# Patient Record
Sex: Female | Born: 1955 | Race: White | Hispanic: No | Smoking: Never smoker
Health system: Southern US, Community
[De-identification: ages and names within clinical notes are randomized; demographics above are authoritative.]

## PROBLEM LIST (undated history)

## (undated) DIAGNOSIS — H269 Unspecified cataract: Secondary | ICD-10-CM

## (undated) DIAGNOSIS — M549 Dorsalgia, unspecified: Secondary | ICD-10-CM

## (undated) DIAGNOSIS — R109 Unspecified abdominal pain: Secondary | ICD-10-CM

## (undated) DIAGNOSIS — G4710 Hypersomnia, unspecified: Secondary | ICD-10-CM

## (undated) DIAGNOSIS — G4700 Insomnia, unspecified: Secondary | ICD-10-CM

## (undated) DIAGNOSIS — M8580 Other specified disorders of bone density and structure, unspecified site: Secondary | ICD-10-CM

## (undated) DIAGNOSIS — D509 Iron deficiency anemia, unspecified: Secondary | ICD-10-CM

## (undated) DIAGNOSIS — G43009 Migraine without aura, not intractable, without status migrainosus: Secondary | ICD-10-CM

## (undated) DIAGNOSIS — F329 Major depressive disorder, single episode, unspecified: Secondary | ICD-10-CM

## (undated) DIAGNOSIS — M1990 Unspecified osteoarthritis, unspecified site: Secondary | ICD-10-CM

## (undated) DIAGNOSIS — J019 Acute sinusitis, unspecified: Secondary | ICD-10-CM

## (undated) DIAGNOSIS — M810 Age-related osteoporosis without current pathological fracture: Secondary | ICD-10-CM

## (undated) DIAGNOSIS — M949 Disorder of cartilage, unspecified: Secondary | ICD-10-CM

## (undated) DIAGNOSIS — M899 Disorder of bone, unspecified: Secondary | ICD-10-CM

## (undated) DIAGNOSIS — K219 Gastro-esophageal reflux disease without esophagitis: Secondary | ICD-10-CM

## (undated) DIAGNOSIS — J309 Allergic rhinitis, unspecified: Secondary | ICD-10-CM

## (undated) DIAGNOSIS — E785 Hyperlipidemia, unspecified: Secondary | ICD-10-CM

## (undated) DIAGNOSIS — F411 Generalized anxiety disorder: Secondary | ICD-10-CM

## (undated) DIAGNOSIS — Z8719 Personal history of other diseases of the digestive system: Secondary | ICD-10-CM

## (undated) HISTORY — DX: Allergic rhinitis, unspecified: J30.9

## (undated) HISTORY — DX: Acute sinusitis, unspecified: J01.90

## (undated) HISTORY — DX: Personal history of other diseases of the digestive system: Z87.19

## (undated) HISTORY — DX: Unspecified cataract: H26.9

## (undated) HISTORY — DX: Disorder of cartilage, unspecified: M94.9

## (undated) HISTORY — DX: Hyperlipidemia, unspecified: E78.5

## (undated) HISTORY — DX: Insomnia, unspecified: G47.00

## (undated) HISTORY — DX: Iron deficiency anemia, unspecified: D50.9

## (undated) HISTORY — DX: Gastro-esophageal reflux disease without esophagitis: K21.9

## (undated) HISTORY — PX: COLONOSCOPY: SHX5424

## (undated) HISTORY — PX: APPENDECTOMY: SHX54

## (undated) HISTORY — PX: ESOPHAGOGASTRODUODENOSCOPY: SHX1529

## (undated) HISTORY — PX: TUBAL LIGATION: SHX77

## (undated) HISTORY — DX: Hypersomnia, unspecified: G47.10

## (undated) HISTORY — DX: Other specified disorders of bone density and structure, unspecified site: M85.80

## (undated) HISTORY — DX: Generalized anxiety disorder: F41.1

## (undated) HISTORY — DX: Disorder of bone, unspecified: M89.9

## (undated) HISTORY — DX: Age-related osteoporosis without current pathological fracture: M81.0

## (undated) HISTORY — DX: Dorsalgia, unspecified: M54.9

## (undated) HISTORY — DX: Unspecified osteoarthritis, unspecified site: M19.90

## (undated) HISTORY — DX: Unspecified abdominal pain: R10.9

## (undated) HISTORY — DX: Major depressive disorder, single episode, unspecified: F32.9

## (undated) HISTORY — DX: Migraine without aura, not intractable, without status migrainosus: G43.009

---

## 1998-07-23 ENCOUNTER — Ambulatory Visit (HOSPITAL_COMMUNITY): Admission: RE | Admit: 1998-07-23 | Discharge: 1998-07-23 | Payer: Self-pay | Admitting: Obstetrics and Gynecology

## 1998-09-17 ENCOUNTER — Encounter: Payer: Self-pay | Admitting: Obstetrics and Gynecology

## 1998-09-17 ENCOUNTER — Ambulatory Visit (HOSPITAL_COMMUNITY): Admission: RE | Admit: 1998-09-17 | Discharge: 1998-09-17 | Payer: Self-pay | Admitting: Obstetrics and Gynecology

## 1998-09-23 ENCOUNTER — Encounter (HOSPITAL_COMMUNITY): Admission: RE | Admit: 1998-09-23 | Discharge: 1998-10-26 | Payer: Self-pay | Admitting: Obstetrics and Gynecology

## 1998-10-09 ENCOUNTER — Inpatient Hospital Stay (HOSPITAL_COMMUNITY): Admission: AD | Admit: 1998-10-09 | Discharge: 1998-10-09 | Payer: Self-pay | Admitting: Obstetrics and Gynecology

## 1998-10-23 ENCOUNTER — Inpatient Hospital Stay (HOSPITAL_COMMUNITY): Admission: AD | Admit: 1998-10-23 | Discharge: 1998-10-28 | Payer: Self-pay | Admitting: Obstetrics and Gynecology

## 1998-10-30 ENCOUNTER — Encounter (HOSPITAL_COMMUNITY): Admission: RE | Admit: 1998-10-30 | Discharge: 1999-01-28 | Payer: Self-pay | Admitting: *Deleted

## 1998-12-24 ENCOUNTER — Other Ambulatory Visit: Admission: RE | Admit: 1998-12-24 | Discharge: 1998-12-24 | Payer: Self-pay | Admitting: Obstetrics and Gynecology

## 2000-05-31 ENCOUNTER — Encounter: Payer: Self-pay | Admitting: Obstetrics and Gynecology

## 2000-05-31 ENCOUNTER — Encounter: Admission: RE | Admit: 2000-05-31 | Discharge: 2000-05-31 | Payer: Self-pay | Admitting: Obstetrics and Gynecology

## 2001-06-28 ENCOUNTER — Encounter: Payer: Self-pay | Admitting: Obstetrics and Gynecology

## 2001-06-28 ENCOUNTER — Encounter: Admission: RE | Admit: 2001-06-28 | Discharge: 2001-06-28 | Payer: Self-pay | Admitting: Obstetrics and Gynecology

## 2001-08-01 ENCOUNTER — Other Ambulatory Visit: Admission: RE | Admit: 2001-08-01 | Discharge: 2001-08-01 | Payer: Self-pay | Admitting: Obstetrics and Gynecology

## 2003-02-08 ENCOUNTER — Other Ambulatory Visit: Admission: RE | Admit: 2003-02-08 | Discharge: 2003-02-08 | Payer: Self-pay | Admitting: Obstetrics and Gynecology

## 2003-03-12 ENCOUNTER — Encounter: Payer: Self-pay | Admitting: Obstetrics and Gynecology

## 2003-03-12 ENCOUNTER — Ambulatory Visit (HOSPITAL_COMMUNITY): Admission: RE | Admit: 2003-03-12 | Discharge: 2003-03-12 | Payer: Self-pay | Admitting: Obstetrics and Gynecology

## 2003-07-09 ENCOUNTER — Encounter: Payer: Self-pay | Admitting: Obstetrics and Gynecology

## 2003-07-09 ENCOUNTER — Ambulatory Visit (HOSPITAL_COMMUNITY): Admission: AD | Admit: 2003-07-09 | Discharge: 2003-07-09 | Payer: Self-pay | Admitting: Obstetrics and Gynecology

## 2003-08-06 ENCOUNTER — Inpatient Hospital Stay (HOSPITAL_COMMUNITY): Admission: AD | Admit: 2003-08-06 | Discharge: 2003-08-08 | Payer: Self-pay | Admitting: Obstetrics and Gynecology

## 2003-08-06 ENCOUNTER — Encounter (INDEPENDENT_AMBULATORY_CARE_PROVIDER_SITE_OTHER): Payer: Self-pay | Admitting: Specialist

## 2004-02-11 ENCOUNTER — Other Ambulatory Visit: Admission: RE | Admit: 2004-02-11 | Discharge: 2004-02-11 | Payer: Self-pay | Admitting: Obstetrics and Gynecology

## 2004-02-15 ENCOUNTER — Ambulatory Visit (HOSPITAL_COMMUNITY): Admission: RE | Admit: 2004-02-15 | Discharge: 2004-02-15 | Payer: Self-pay | Admitting: Obstetrics and Gynecology

## 2005-03-10 ENCOUNTER — Other Ambulatory Visit: Admission: RE | Admit: 2005-03-10 | Discharge: 2005-03-10 | Payer: Self-pay | Admitting: Obstetrics and Gynecology

## 2005-09-30 ENCOUNTER — Ambulatory Visit: Payer: Self-pay | Admitting: Internal Medicine

## 2005-10-05 ENCOUNTER — Ambulatory Visit: Payer: Self-pay | Admitting: Internal Medicine

## 2005-10-20 ENCOUNTER — Ambulatory Visit: Payer: Self-pay

## 2007-01-27 ENCOUNTER — Ambulatory Visit: Payer: Self-pay | Admitting: Internal Medicine

## 2007-01-27 LAB — CONVERTED CEMR LAB
Basophils Absolute: 0.1 10*3/uL (ref 0.0–0.1)
Basophils Relative: 0.7 % (ref 0.0–1.0)
Bilirubin Urine: NEGATIVE
CO2: 28 meq/L (ref 19–32)
Creatinine, Ser: 0.8 mg/dL (ref 0.4–1.2)
FSH: 4 milliintl units/mL
GFR calc Af Amer: 98 mL/min
HCT: 38.7 % (ref 36.0–46.0)
Hemoglobin, Urine: NEGATIVE
Hemoglobin: 13.6 g/dL (ref 12.0–15.0)
LDL Cholesterol: 88 mg/dL (ref 0–99)
Leukocytes, UA: NEGATIVE
Lymphocytes Relative: 29 % (ref 12.0–46.0)
MCHC: 35.1 g/dL (ref 30.0–36.0)
Monocytes Absolute: 0.7 10*3/uL (ref 0.2–0.7)
Monocytes Relative: 9.5 % (ref 3.0–11.0)
Neutro Abs: 4 10*3/uL (ref 1.4–7.7)
Neutrophils Relative %: 55.5 % (ref 43.0–77.0)
Potassium: 4.2 meq/L (ref 3.5–5.1)
RDW: 12 % (ref 11.5–14.6)
Sodium: 139 meq/L (ref 135–145)
TSH: 3.17 microintl units/mL (ref 0.35–5.50)
Total Bilirubin: 0.4 mg/dL (ref 0.3–1.2)
Total CHOL/HDL Ratio: 3.5
Total Protein: 6.9 g/dL (ref 6.0–8.3)
Urobilinogen, UA: 0.2 (ref 0.0–1.0)
VLDL: 30 mg/dL (ref 0–40)
pH: 6.5 (ref 5.0–8.0)

## 2007-01-31 ENCOUNTER — Ambulatory Visit: Payer: Self-pay | Admitting: Internal Medicine

## 2007-02-21 ENCOUNTER — Ambulatory Visit: Payer: Self-pay | Admitting: Internal Medicine

## 2007-02-21 LAB — CONVERTED CEMR LAB: Phosphorus: 3.4 mg/dL (ref 2.3–4.6)

## 2007-10-30 DIAGNOSIS — K219 Gastro-esophageal reflux disease without esophagitis: Secondary | ICD-10-CM | POA: Insufficient documentation

## 2007-10-30 DIAGNOSIS — Z8719 Personal history of other diseases of the digestive system: Secondary | ICD-10-CM

## 2007-10-30 DIAGNOSIS — F329 Major depressive disorder, single episode, unspecified: Secondary | ICD-10-CM

## 2007-10-30 DIAGNOSIS — G43009 Migraine without aura, not intractable, without status migrainosus: Secondary | ICD-10-CM | POA: Insufficient documentation

## 2007-10-30 DIAGNOSIS — D509 Iron deficiency anemia, unspecified: Secondary | ICD-10-CM

## 2007-10-30 DIAGNOSIS — J309 Allergic rhinitis, unspecified: Secondary | ICD-10-CM

## 2007-10-30 DIAGNOSIS — F3289 Other specified depressive episodes: Secondary | ICD-10-CM

## 2007-10-30 DIAGNOSIS — F411 Generalized anxiety disorder: Secondary | ICD-10-CM

## 2007-10-30 HISTORY — DX: Major depressive disorder, single episode, unspecified: F32.9

## 2007-10-30 HISTORY — DX: Personal history of other diseases of the digestive system: Z87.19

## 2007-10-30 HISTORY — DX: Generalized anxiety disorder: F41.1

## 2007-10-30 HISTORY — DX: Iron deficiency anemia, unspecified: D50.9

## 2007-10-30 HISTORY — DX: Other specified depressive episodes: F32.89

## 2007-10-30 HISTORY — DX: Migraine without aura, not intractable, without status migrainosus: G43.009

## 2007-10-30 HISTORY — DX: Allergic rhinitis, unspecified: J30.9

## 2007-10-30 HISTORY — DX: Gastro-esophageal reflux disease without esophagitis: K21.9

## 2008-02-07 ENCOUNTER — Ambulatory Visit: Payer: Self-pay | Admitting: Internal Medicine

## 2008-02-07 LAB — CONVERTED CEMR LAB
ALT: 20 units/L (ref 0–35)
Alkaline Phosphatase: 62 units/L (ref 39–117)
BUN: 9 mg/dL (ref 6–23)
Basophils Relative: 0.9 % (ref 0.0–1.0)
Bilirubin Urine: NEGATIVE
CO2: 30 meq/L (ref 19–32)
Calcium: 9.5 mg/dL (ref 8.4–10.5)
Chloride: 105 meq/L (ref 96–112)
GFR calc Af Amer: 98 mL/min
GFR calc non Af Amer: 81 mL/min
Ketones, ur: NEGATIVE mg/dL
LDL Cholesterol: 110 mg/dL — ABNORMAL HIGH (ref 0–99)
Lymphocytes Relative: 20 % (ref 12.0–46.0)
Monocytes Relative: 5.7 % (ref 3.0–11.0)
Neutro Abs: 6.4 10*3/uL (ref 1.4–7.7)
Platelets: 243 10*3/uL (ref 150–400)
RBC: 4.99 M/uL (ref 3.87–5.11)
Specific Gravity, Urine: 1.015 (ref 1.000–1.03)
Total CHOL/HDL Ratio: 4.8
Total Protein, Urine: NEGATIVE mg/dL
Total Protein: 7.4 g/dL (ref 6.0–8.3)
Triglycerides: 151 mg/dL — ABNORMAL HIGH (ref 0–149)
Urine Glucose: NEGATIVE mg/dL
VLDL: 30 mg/dL (ref 0–40)
pH: 7 (ref 5.0–8.0)

## 2008-02-13 ENCOUNTER — Ambulatory Visit: Payer: Self-pay | Admitting: Internal Medicine

## 2008-02-13 DIAGNOSIS — M8580 Other specified disorders of bone density and structure, unspecified site: Secondary | ICD-10-CM | POA: Insufficient documentation

## 2008-02-13 DIAGNOSIS — J019 Acute sinusitis, unspecified: Secondary | ICD-10-CM

## 2008-02-13 DIAGNOSIS — M549 Dorsalgia, unspecified: Secondary | ICD-10-CM | POA: Insufficient documentation

## 2008-02-13 DIAGNOSIS — M899 Disorder of bone, unspecified: Secondary | ICD-10-CM

## 2008-02-13 HISTORY — DX: Acute sinusitis, unspecified: J01.90

## 2008-02-13 HISTORY — DX: Disorder of bone, unspecified: M89.9

## 2008-02-13 HISTORY — DX: Dorsalgia, unspecified: M54.9

## 2008-09-05 ENCOUNTER — Telehealth (INDEPENDENT_AMBULATORY_CARE_PROVIDER_SITE_OTHER): Payer: Self-pay | Admitting: *Deleted

## 2008-09-11 ENCOUNTER — Ambulatory Visit: Payer: Self-pay | Admitting: Internal Medicine

## 2008-09-11 DIAGNOSIS — G4700 Insomnia, unspecified: Secondary | ICD-10-CM

## 2008-09-11 DIAGNOSIS — G4710 Hypersomnia, unspecified: Secondary | ICD-10-CM

## 2008-09-11 HISTORY — DX: Hypersomnia, unspecified: G47.10

## 2008-09-11 HISTORY — DX: Insomnia, unspecified: G47.00

## 2009-03-21 ENCOUNTER — Encounter: Payer: Self-pay | Admitting: Internal Medicine

## 2009-03-21 ENCOUNTER — Ambulatory Visit: Payer: Self-pay | Admitting: Internal Medicine

## 2009-05-09 ENCOUNTER — Ambulatory Visit: Payer: Self-pay | Admitting: Internal Medicine

## 2009-05-09 LAB — CONVERTED CEMR LAB
ALT: 16 units/L (ref 0–35)
Alkaline Phosphatase: 54 units/L (ref 39–117)
Basophils Relative: 0.6 % (ref 0.0–3.0)
Bilirubin, Direct: 0.1 mg/dL (ref 0.0–0.3)
Calcium: 8.9 mg/dL (ref 8.4–10.5)
Creatinine, Ser: 0.9 mg/dL (ref 0.4–1.2)
Eosinophils Relative: 6.3 % — ABNORMAL HIGH (ref 0.0–5.0)
HDL: 50.8 mg/dL (ref >39.00)
Hemoglobin, Urine: NEGATIVE
LDL Cholesterol: 104 mg/dL — ABNORMAL HIGH (ref 0–99)
Lymphocytes Relative: 28.5 % (ref 12.0–46.0)
Neutrophils Relative %: 54.3 % (ref 43.0–77.0)
RBC: 4.72 M/uL (ref 3.87–5.11)
Total CHOL/HDL Ratio: 3
Total Protein, Urine: NEGATIVE mg/dL
Total Protein: 7.1 g/dL (ref 6.0–8.3)
Triglycerides: 92 mg/dL (ref 0.0–149.0)
Urine Glucose: NEGATIVE mg/dL
WBC: 4.7 10*3/uL (ref 4.5–10.5)

## 2009-05-14 ENCOUNTER — Ambulatory Visit: Payer: Self-pay | Admitting: Internal Medicine

## 2009-05-14 DIAGNOSIS — R109 Unspecified abdominal pain: Secondary | ICD-10-CM

## 2009-05-14 HISTORY — DX: Unspecified abdominal pain: R10.9

## 2009-05-15 ENCOUNTER — Telehealth (INDEPENDENT_AMBULATORY_CARE_PROVIDER_SITE_OTHER): Payer: Self-pay | Admitting: *Deleted

## 2009-06-06 ENCOUNTER — Telehealth (INDEPENDENT_AMBULATORY_CARE_PROVIDER_SITE_OTHER): Payer: Self-pay | Admitting: *Deleted

## 2009-07-31 ENCOUNTER — Telehealth: Payer: Self-pay | Admitting: Internal Medicine

## 2010-03-01 IMAGING — CR DG PELVIS 1-2V
1 series · 1 of 1 positions shown · non-contrast
Comparison: None

CLINICAL DATA: Left-sided coccygeal pain

PELVIS - 1-2 VIEW

[view not recorded]
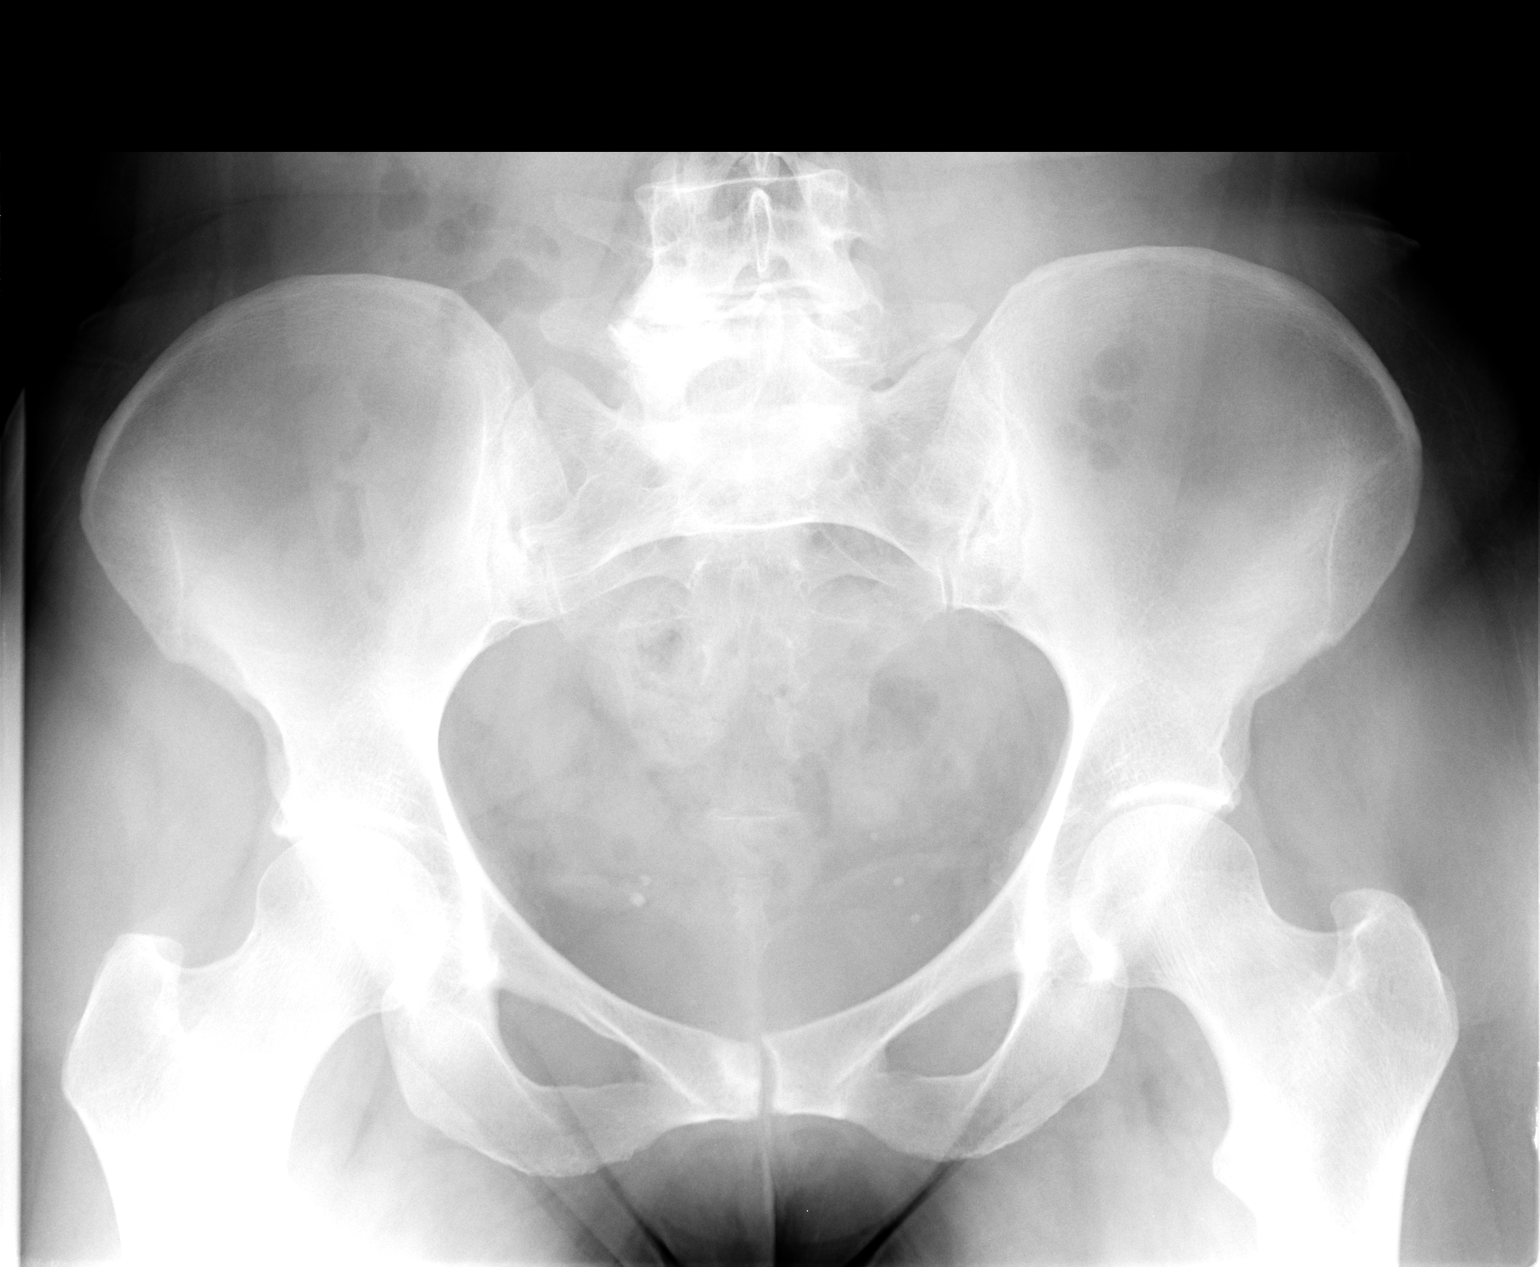

[1 of 1 positions shown; findings below may reference images not displayed]

FINDINGS: Bony pelvis intact.  Hip is in normal position.  Mild
degenerative changes lower lumbar spine and at the pubic symphysis.
The SI joints are symmetric and normal.
IMPRESSION: Mild degenerative changes lower lumbar spine and at the pubic
symphysis.

## 2010-04-07 ENCOUNTER — Telehealth: Payer: Self-pay | Admitting: Internal Medicine

## 2010-05-12 ENCOUNTER — Telehealth: Payer: Self-pay | Admitting: Internal Medicine

## 2010-05-12 ENCOUNTER — Ambulatory Visit: Payer: Self-pay | Admitting: Internal Medicine

## 2010-05-12 LAB — CONVERTED CEMR LAB
ALT: 13 units/L (ref 0–35)
AST: 19 units/L (ref 0–37)
Alkaline Phosphatase: 56 units/L (ref 39–117)
BUN: 11 mg/dL (ref 6–23)
Basophils Absolute: 0 10*3/uL (ref 0.0–0.1)
Bilirubin Urine: NEGATIVE
Bilirubin, Direct: 0 mg/dL (ref 0.0–0.3)
Calcium: 8.5 mg/dL (ref 8.4–10.5)
Cholesterol: 162 mg/dL (ref 0–200)
Creatinine, Ser: 0.7 mg/dL (ref 0.4–1.2)
Eosinophils Relative: 3.9 % (ref 0.0–5.0)
GFR calc non Af Amer: 90.48 mL/min (ref >60)
HCT: 40.5 % (ref 36.0–46.0)
HDL: 59.5 mg/dL (ref >39.00)
Hemoglobin, Urine: NEGATIVE
LDL Cholesterol: 89 mg/dL (ref 0–99)
Lymphocytes Relative: 25.6 % (ref 12.0–46.0)
Monocytes Relative: 9 % (ref 3.0–12.0)
Neutrophils Relative %: 60.8 % (ref 43.0–77.0)
Platelets: 246 10*3/uL (ref 150.0–400.0)
Potassium: 4.7 meq/L (ref 3.5–5.1)
TSH: 1.8 microintl units/mL (ref 0.35–5.50)
Total Bilirubin: 0.6 mg/dL (ref 0.3–1.2)
Total Protein, Urine: NEGATIVE mg/dL
Urine Glucose: NEGATIVE mg/dL
VLDL: 13.4 mg/dL (ref 0.0–40.0)
WBC: 6.4 10*3/uL (ref 4.5–10.5)
pH: 8 (ref 5.0–8.0)

## 2010-05-15 ENCOUNTER — Telehealth: Payer: Self-pay | Admitting: Internal Medicine

## 2010-05-15 ENCOUNTER — Ambulatory Visit: Payer: Self-pay | Admitting: Internal Medicine

## 2010-05-15 DIAGNOSIS — R5383 Other fatigue: Secondary | ICD-10-CM

## 2010-05-15 DIAGNOSIS — R5381 Other malaise: Secondary | ICD-10-CM

## 2010-05-21 ENCOUNTER — Encounter (INDEPENDENT_AMBULATORY_CARE_PROVIDER_SITE_OTHER): Payer: Self-pay | Admitting: *Deleted

## 2010-07-25 ENCOUNTER — Telehealth: Payer: Self-pay | Admitting: Internal Medicine

## 2010-12-02 ENCOUNTER — Telehealth: Payer: Self-pay | Admitting: Internal Medicine

## 2011-01-18 LAB — CONVERTED CEMR LAB
Calcium, Total (PTH): 9.6 mg/dL (ref 8.4–10.5)
PTH: 18.7 pg/mL (ref 14.0–72.0)

## 2011-01-22 NOTE — Progress Notes (Signed)
Summary: medication  refill  Phone Note Refill Request Message from:  Fax from Pharmacy on December 02, 2010 10:20 AM  Refills Requested: Medication #1:  ZOLPIDEM TARTRATE 10 MG TABS Take 1 tablet by mouth at bedtime as needed -   Dosage confirmed as above?Dosage Confirmed   Last Refilled: 05/15/2010   Notes: [facility], fax#[phone] Initial call taken by: Zella Ball Ewing CMA Duncan Dull),  December 02, 2010 10:22 AM  Follow-up for Phone Call        faxed hardcopy to pharmacy Follow-up by: Robin Ewing CMA Duncan Dull),  December 02, 2010 1:19 PM    Prescriptions: ZOLPIDEM TARTRATE 10 MG TABS (ZOLPIDEM TARTRATE) Take 1 tablet by mouth at bedtime as needed -  #30 x 5   Entered and Authorized by:   Corwin Levins MD   Signed by:   Corwin Levins MD on 12/02/2010   Method used:   Print then Give to Patient   RxID:   [id number]  done hardcopy to LIM side B - dahlia Corwin Levins MD  December 02, 2010 1:08 PM

## 2011-01-22 NOTE — Assessment & Plan Note (Signed)
Summary: PHYSICAL--STC   Vital Signs:  Patient profile:   55 year old female Height:      66.5 inches Weight:      158.38 pounds BMI:     25.27 O2 Sat:      98 % on Room air Temp:     98.4 degrees F oral BP sitting:   102 / 64  (left arm) Cuff size:   regular  Vitals Entered ByZella Ball Ewing (May 15, 2010 11:28 AM)  O2 Flow:  Room air  CC: Adult physical/RE   CC:  Adult physical/RE.  History of Present Illness: overall doing well, but with signficant worsening anxiety despite currnet meds without panic, trigger or specific situation it seems, and wihtout suicidal ideation; as well as ongoing fatigue despite no other depressive symtpoms;  Pt denies CP, sob, doe, wheezing, orthopnea, pnd, worsening LE edema, palps, dizziness or syncope  Pt denies new neuro symptoms such as headache, facial or extremity weakness   Still with significnat daytime somnolence, now willing for sleep eval., but also with less than ideal sleep hygeine with much dificulty getting to sleep as well.  Also with recent soreness to the legs recently after increased exertions, now resolved after present for 2 to 3 days.  Has some difficulty with occasinally finding certain words but remembers tham later, adn no othe TIA or stroke like symptoms such as headache, or weakness.  No recent fever, trauma but has ongoing stress as well.    Preventive Screening-Counseling & Management      Drug Use:  no.    Problems Prior to Update: 1)  Back Pain  (ICD-724.5) 2)  Pelvic Pain, Chronic  (ICD-789.09) 3)  Preventive Health Care  (ICD-V70.0) 4)  Insomnia-sleep Disorder-unspec  (ICD-780.52) 5)  Screening For Alcoholism  (ICD-V79.1) 6)  Hypersomnia  (ICD-780.54) 7)  Sinusitis- Acute-nos  (ICD-461.9) 8)  Back Pain  (ICD-724.5) 9)  Preventive Health Care  (ICD-V70.0) 10)  Osteopenia  (ICD-733.90) 11)  Routine General Medical Exam@health  Care Facl  (ICD-V70.0) 12)  Irritable Bowel Syndrome, Hx of  (ICD-V12.79) 13)  Family  History Depression  (ICD-V17.0) 14)  Family History of Cad Female 1st Degree Relative <50  (ICD-V17.3) 15)  Family History of Cad Female 1st Degree Relative <60  (ICD-V16.49) 16)  Anxiety  (ICD-300.00) 17)  Common Migraine  (ICD-346.10) 18)  Allergic Rhinitis  (ICD-477.9) 19)  Depression  (ICD-311) 20)  Gerd  (ICD-530.81) 21)  Anemia-iron Deficiency  (ICD-280.9)  Medications Prior to Update: 1)  Alprazolam 0.5 Mg Tabs (Alprazolam) .... Take 1 Tablet By Mouth Twice A Day As Needed 2)  Zolpidem Tartrate 10 Mg Tabs (Zolpidem Tartrate) .... Take 1 Tablet By Mouth At Bedtime As Needed - Please Make Return Office Visit For Further Refills 3)  Calcium W/ Vit D .... 1 By Mouth Qd 4)  Celexa 20 Mg Tabs (Citalopram Hydrobromide) .Marland Kitchen.. 1 By Mouth Once Daily 5)  Balziva 0.4-35 Mg-Mcg Tabs (Norethindrone-Eth Estradiol) .Marland Kitchen.. 1 By Mouth Daily 6)  Wellbutrin Sr 150 Mg Xr12h-Tab (Bupropion Hcl) .Marland Kitchen.. 1 By Mouth Once Daily 7)  Meloxicam 15 Mg Tabs (Meloxicam) .Marland Kitchen.. 1 By Mouth Once Daily 8)  Cetirizine Hcl 10 Mg Tabs (Cetirizine Hcl) .Marland Kitchen.. 1 By Mouth Once Daily As Needed  Current Medications (verified): 1)  Alprazolam 0.5 Mg Tabs (Alprazolam) .... Take 1 Tablet By Mouth Twice A Day As Needed 2)  Zolpidem Tartrate 10 Mg Tabs (Zolpidem Tartrate) .... Take 1 Tablet By Mouth At Bedtime As Needed -  3)  Calcium W/ Vit D .... 1 By Mouth Qd 4)  Fluoxetine Hcl 20 Mg Caps (Fluoxetine Hcl) .Marland Kitchen.. 1 By Mouth Once Daily 5)  Balziva 0.4-35 Mg-Mcg Tabs (Norethindrone-Eth Estradiol) .Marland Kitchen.. 1 By Mouth Daily 6)  Wellbutrin Sr 150 Mg Xr12h-Tab (Bupropion Hcl) .Marland Kitchen.. 1 By Mouth Once Daily 7)  Meloxicam 15 Mg Tabs (Meloxicam) .Marland Kitchen.. 1 By Mouth Once Daily 8)  Cetirizine Hcl 10 Mg Tabs (Cetirizine Hcl) .Marland Kitchen.. 1 By Mouth Once Daily As Needed 9)  Vitamin C 1000 Mg Tabs (Ascorbic Acid) .Marland Kitchen.. 1 By Mouth Once Daily  Allergies (verified): No Known Drug Allergies  Past History:  Past Medical History: Last updated: 02/13/2008 Anemia-iron  deficiency hx of menorrhatia GERD Depression Allergic rhinitis migraine Anxiety IBS endometriosis Osteopenia  Past Surgical History: Last updated: 10/30/2007 Tubal ligation c-section  Family History: Last updated: 10/30/2007 Family History of CAD Female 1st degree relative Family History of CAD Female 1st degree relative <60 - sister Family History Depression  Social History: Last updated: 05/15/2010 Never Smoked Alcohol use-yes 4 children second marriage Drug use-no  Risk Factors: Smoking Status: never (10/30/2007)  Family History: Reviewed history from 10/30/2007 and no changes required. Family History of CAD Female 1st degree relative Family History of CAD Female 1st degree relative <60 - sister Family History Depression  Social History: Reviewed history from 05/14/2009 and no changes required. Never Smoked Alcohol use-yes 4 children second marriage Drug use-no Drug Use:  no  Review of Systems  The patient denies anorexia, fever, weight loss, vision loss, decreased hearing, hoarseness, chest pain, syncope, dyspnea on exertion, peripheral edema, prolonged cough, headaches, hemoptysis, abdominal pain, melena, hematochezia, severe indigestion/heartburn, hematuria, muscle weakness, suspicious skin lesions, transient blindness, difficulty walking, depression, unusual weight change, abnormal bleeding, enlarged lymph nodes, and angioedema.         all otherwise negative per pt -    Physical Exam  General:  alert and well-developed.   Head:  normocephalic and atraumatic.   Eyes:  vision grossly intact, pupils equal, and pupils round.   Ears:  R ear normal and L ear normal.   Nose:  no external deformity and nose piercing noted.   Mouth:  no gingival abnormalities and pharynx pink and moist.   Neck:  supple and no masses.   Lungs:  normal respiratory effort and normal breath sounds.   Heart:  normal rate and regular rhythm.   Abdomen:  soft, non-tender, and  normal bowel sounds.   Msk:  no joint tenderness and no joint swelling.   Extremities:  no edema, no erythema  Neurologic:  cranial nerves II-XII intact, strength normal in all extremities, sensation intact to light touch, and gait normal.   Skin:  color normal and no rashes.   Psych:  not depressed appearing and moderately anxious.     Impression & Recommendations:  Problem # 1:  Preventive Health Care (ICD-V70.0)  Overall doing well, age appropriate education and counseling updated and referral for appropriate preventive services done unless declined, immunizations up to date or declined, diet counseling done if overweight, urged to quit smoking if smokes , most recent labs reviewed and current ordered if appropriate, ecg reviewed or declined (interpretation per ECG scanned in the EMR if done); information regarding Medicare Prevention requirements given if appropriate; speciality referrals updated as appropriate; eclines tetnaus and colon screening for now   Orders: EKG w/ Interpretation (93000)  Problem # 2:  FATIGUE (ICD-780.79) d/w pt -  exam benign, to make changes below;  follow with expectant management   Problem # 3:  DEPRESSION (ICD-311)  Her updated medication list for this problem includes:    Alprazolam 0.5 Mg Tabs (Alprazolam) .Marland Kitchen... Take 1 tablet by mouth twice a day as needed    Fluoxetine Hcl 20 Mg Caps (Fluoxetine hcl) .Marland Kitchen... 1 by mouth once daily    Wellbutrin Sr 150 Mg Xr12h-tab (Bupropion hcl) .Marland Kitchen... 1 by mouth once daily with anxiety -and ? fatigue due to the celexa; to stop the celexa, change to prozac, Continue all previous medications as before this visit ; consider nuvigil for alertness if med change and sleep eval do not help the fatigue  Problem # 4:  HYPERSOMNIA (ICD-780.54)  now willing for sleep eval  - refer pulm  Orders: Pulmonary Referral (Pulmonary)  Problem # 5:  INSOMNIA-SLEEP DISORDER-UNSPEC (ICD-780.52)  Her updated medication list for this  problem includes:    Zolpidem Tartrate 10 Mg Tabs (Zolpidem tartrate) .Marland Kitchen... Take 1 tablet by mouth at bedtime as needed - treat as above, f/u any worsening signs or symptoms   Complete Medication List: 1)  Alprazolam 0.5 Mg Tabs (Alprazolam) .... Take 1 tablet by mouth twice a day as needed 2)  Zolpidem Tartrate 10 Mg Tabs (Zolpidem tartrate) .... Take 1 tablet by mouth at bedtime as needed - 3)  Calcium W/ Vit D  .... 1 by mouth qd 4)  Fluoxetine Hcl 20 Mg Caps (Fluoxetine hcl) .Marland Kitchen.. 1 by mouth once daily 5)  Balziva 0.4-35 Mg-mcg Tabs (Norethindrone-eth estradiol) .Marland Kitchen.. 1 by mouth daily 6)  Wellbutrin Sr 150 Mg Xr12h-tab (Bupropion hcl) .Marland Kitchen.. 1 by mouth once daily 7)  Meloxicam 15 Mg Tabs (Meloxicam) .Marland Kitchen.. 1 by mouth once daily 8)  Cetirizine Hcl 10 Mg Tabs (Cetirizine hcl) .Marland Kitchen.. 1 by mouth once daily as needed 9)  Vitamin C 1000 Mg Tabs (Ascorbic acid) .Marland Kitchen.. 1 by mouth once daily  Other Orders: T-Parathyroid Hormone, Intact w/ Calcium ([id number])  Patient Instructions: 1)  please call [phone] to talk to [facility] when you are ready for the  colon testing 2)  You will be contacted about the referral(s) to: Pulmonary referral for the sleep problem  3)  Please go to the Lab in the basement for your blood tests today  4)  please call if you want the tetanus shot 5)  stop the celexa 6)  start the generic prozac 20 mg per day 7)  Continue all previous medications as before this visit  8)  Please schedule a follow-up appointment in 6 months or sooner if needed Prescriptions: FLUOXETINE HCL 20 MG CAPS (FLUOXETINE HCL) 1 by mouth once daily  #90 x 3   Entered and Authorized by:   Corwin Levins MD   Signed by:   Corwin Levins MD on 05/15/2010   Method used:   Print then Give to Patient   RxID:   [id number] ZOLPIDEM TARTRATE 10 MG TABS (ZOLPIDEM TARTRATE) Take 1 tablet by mouth at bedtime as needed -  #30 x 5   Entered and Authorized by:   Corwin Levins MD   Signed by:   Corwin Levins MD on  05/15/2010   Method used:   Print then Give to Patient   RxID:   [id number]

## 2011-01-22 NOTE — Progress Notes (Signed)
  Phone Note Refill Request  on May 12, 2010 9:14 AM  Refills Requested: Medication #1:  ZOLPIDEM TARTRATE 10 MG TABS Take 1 tablet by mouth at bedtime as needed   Dosage confirmed as above?Dosage Confirmed   Notes: [facility] [address] [phone] Initial call taken by: Scharlene Gloss,  May 12, 2010 9:15 AM    New/Updated Medications: ZOLPIDEM TARTRATE 10 MG TABS (ZOLPIDEM TARTRATE) Take 1 tablet by mouth at bedtime as needed - please make return office visit for further refills Prescriptions: ZOLPIDEM TARTRATE 10 MG TABS (ZOLPIDEM TARTRATE) Take 1 tablet by mouth at bedtime as needed - please make return office visit for further refills  #30 x 0   Entered and Authorized by:   Corwin Levins MD   Signed by:   Corwin Levins MD on 05/12/2010   Method used:   Print then Give to Patient   RxID:   [id number]  done hardcopy to LIM side B - dahlia  Corwin Levins MD  May 12, 2010 1:16 PM   Rx faxed to pharmacy Margaret Pyle, CMA  May 12, 2010 1:32 PM

## 2011-01-22 NOTE — Progress Notes (Signed)
----   Converted from flag ---- ---- 05/15/2010 1:04 PM, Corwin Levins MD wrote: a typical regimen in oscal plus D - 1 by mouth three times a day -(or at least two times a day -) with meals  -- 05/15/2010 12:19 PM, Zella Ball Ewing wrote: Pt forgot to ask how much Vit. D and Calcium she should take. ------------------------------  called pt left msg to call back 05/15/2010  Patient returned call informed of above informatin. 05/15/2010

## 2011-01-22 NOTE — Letter (Signed)
Summary: [facility] Consult Scheduled Letter  [facility]  [address] [place], [place] [address]   Phone: [phone]  Fax: [phone]      05/21/2010 MRN: [id number]  [facility] [address] [place], [place]  [address]    Dear Ms. Denise Park,      We have scheduled an appointment for you.  At the recommendation of Dr.John, we have scheduled you a consult with Dr Craige Cotta on 05/28/10 at 4:00pm.  Their phone number is [phone].  If this appointment day and time is not convenient for you, please feel free to call the office of the doctor you are being referred to at the number listed above and reschedule the appointment.     [facility] [address] [place], [place] [address] *Pulmonary Dept.2nd Floor*   Please give 24hr notice if you need to cancel/reschedule to avoid a $50.00 fee.Also bring insurance card and any co-pay due at time of visit.    Thank you    Patient Care Coordinator [facility]

## 2011-01-22 NOTE — Progress Notes (Signed)
Summary: medication refill  Phone Note Refill Request Message from:  Fax from Pharmacy on July 25, 2010 9:56 AM  Refills Requested: Medication #1:  ALPRAZOLAM 0.5 MG TABS Take 1 tablet by mouth twice a day as needed   Dosage confirmed as above?Dosage Confirmed   Last Refilled: 07/31/2009   Notes: [facility] [address] [place], [place], fax#[phone] Initial call taken by: Zella Ball Ewing CMA Duncan Dull),  July 25, 2010 9:57 AM  Follow-up for Phone Call        Rx faxed to pharmacy Follow-up by: Margaret Pyle, CMA,  July 25, 2010 3:08 PM    New/Updated Medications: ALPRAZOLAM 0.5 MG TABS (ALPRAZOLAM) Take 1 tablet by mouth twice a day as needed Prescriptions: ALPRAZOLAM 0.5 MG TABS (ALPRAZOLAM) Take 1 tablet by mouth twice a day as needed  #60 x 2   Entered and Authorized by:   Corwin Levins MD   Signed by:   Corwin Levins MD on 07/25/2010   Method used:   Print then Give to Patient   RxID:   [id number]  done hardcopy to LIM side B - dahlia Corwin Levins MD  July 25, 2010 2:58 PM

## 2011-01-22 NOTE — Progress Notes (Signed)
Summary: Labs  Phone Note Call from Patient Call back at [facility] Phone [phone]   Caller: Patient Summary of Call: Pt called requesting B-12 be added to her CPX labs. CPX scheduled for 05/22 Initial call taken by: Margaret Pyle, CMA,  April 07, 2010 1:37 PM  Follow-up for Phone Call        ok for   b12/folate  - 285.9 Follow-up by: Corwin Levins MD,  April 07, 2010 2:02 PM  Additional Follow-up for Phone Call Additional follow up Details #1::        added to labs Additional Follow-up by: Margaret Pyle, CMA,  April 07, 2010 2:17 PM

## 2011-02-06 ENCOUNTER — Encounter (INDEPENDENT_AMBULATORY_CARE_PROVIDER_SITE_OTHER): Payer: Self-pay | Admitting: *Deleted

## 2011-02-09 ENCOUNTER — Encounter (INDEPENDENT_AMBULATORY_CARE_PROVIDER_SITE_OTHER): Payer: Self-pay | Admitting: *Deleted

## 2011-02-10 ENCOUNTER — Encounter: Payer: Self-pay | Admitting: Internal Medicine

## 2011-02-11 NOTE — Letter (Signed)
Summary: Pre Visit Letter Revised  [facility]  [address] [place], [place] [address]   Phone: [phone]  Fax: [phone]        02/06/2011 MRN: [id number] [facility] [address] [place], [place]  [address]             Procedure Date:  02-19-11            Direct Colon-Dr. Leone Payor  Welcome to the Gastroenterology Division at [facility].    You are scheduled to see a nurse for your pre-procedure visit on 02-10-11 at 10:30A.M. on the 3rd floor at [facility] [place] [facility], [address]  We ask that you try to arrive at our office 15 minutes prior to your appointment time to allow for check-in.  Please take a minute to review the attached form.  If you answer "Yes" to one or more of the questions on the first page, we ask that you call the person listed at your earliest opportunity.  If you answer "No" to all of the questions, please complete the rest of the form and bring it to your appointment.    Your nurse visit will consist of discussing your medical and surgical history, your immediate family medical history, and your medications.   If you are unable to list all of your medications on the form, please bring the medication bottles to your appointment and we will list them.  We will need to be aware of both prescribed and over the counter drugs.  We will need to know exact dosage information as well.    Please be prepared to read and sign documents such as consent forms, a financial agreement, and acknowledgement forms.  If necessary, and with your consent, a friend or relative is welcome to sit-in on the nurse visit with you.  Please bring your insurance card so that we may make a copy of it.  If your insurance requires a referral to see a specialist, please bring your referral form from your primary care physician.  No co-pay is required for this nurse visit.     If you cannot keep your appointment, please call [phone] to cancel or reschedule prior to  your appointment date.  This allows [place] the opportunity to schedule an appointment for another patient in need of care.    Thank you for choosing  Gastroenterology for your medical needs.  We appreciate the opportunity to care for you.  Please visit [place] at our website  to learn more about our practice.  Sincerely, The Gastroenterology Division

## 2011-02-17 NOTE — Letter (Signed)
Summary: [facility] Instructions  [facility]  [address] [place], [place] [address]   Phone: [phone]  Fax: [phone]       Denise Park    [birth year]    MRN: [id number]        Procedure Day Denise Park:  Denise Park  02/19/11     Arrival Time:  8:30AM     Procedure Time:  9:30AM     Location of Procedure:                    [name] _  [facility] (4th Floor)  PREPARATION FOR COLONOSCOPY WITH MOVIPREP   Starting 5 days prior to your procedure 02/14/11 do not eat nuts, seeds, popcorn, corn, beans, peas,  salads, or any raw vegetables.  Do not take any fiber supplements (e.g. Metamucil, Citrucel, and Benefiber).  THE DAY BEFORE YOUR PROCEDURE         DATE: 02/18/11  DAY: WEDNESDAY  1.  Drink clear liquids the entire day-NO SOLID FOOD  2.  Do not drink anything colored red or purple.  Avoid juices with pulp.  No orange juice.  3.  Drink at least 64 oz. (8 glasses) of fluid/clear liquids during the day to prevent dehydration and help the prep work efficiently.  CLEAR LIQUIDS INCLUDE: Water Jello Ice Popsicles Tea (sugar ok, no milk/cream) Powdered fruit flavored drinks Coffee (sugar ok, no milk/cream) Gatorade Juice: apple, white grape, white cranberry  Lemonade Clear bullion, consomm, broth Carbonated beverages (any kind) Strained chicken noodle soup Hard Candy                             4.  In the morning, mix first dose of MoviPrep solution:    Empty 1 Pouch A and 1 Pouch B into the disposable container    Add lukewarm drinking water to the top line of the container. Mix to dissolve    Refrigerate (mixed solution should be used within 24 hrs)  5.  Begin drinking the prep at 5:00 p.m. The MoviPrep container is divided by 4 marks.   Every 15 minutes drink the solution down to the next mark (approximately 8 oz) until the full liter is complete.   6.  Follow completed prep with 16 oz of clear liquid of your choice (Nothing red or purple).   Continue to drink clear liquids until bedtime.  7.  Before going to bed, mix second dose of MoviPrep solution:    Empty 1 Pouch A and 1 Pouch B into the disposable container    Add lukewarm drinking water to the top line of the container. Mix to dissolve    Refrigerate  THE DAY OF YOUR PROCEDURE      DATE: 02/19/11 DAY: THURSDAY  Beginning at 4:30AM (5 hours before procedure):         1. Every 15 minutes, drink the solution down to the next mark (approx 8 oz) until the full liter is complete.  2. Follow completed prep with 16 oz. of clear liquid of your choice.    3. You may drink clear liquids until 7:30AM (2 HOURS BEFORE PROCEDURE).   MEDICATION INSTRUCTIONS  Unless otherwise instructed, you should take regular prescription medications with a small sip of water   as early as possible the morning of your procedure.        OTHER INSTRUCTIONS  You will need a responsible adult at least 53 years  of age to accompany you and drive you home.   This person must remain in the waiting room during your procedure.  Wear loose fitting clothing that is easily removed.  Leave jewelry and other valuables at home.  However, you may wish to bring a book to read or  an iPod/MP3 player to listen to music as you wait for your procedure to start.  Remove all body piercing jewelry and leave at home.  Total time from sign-in until discharge is approximately 2-3 hours.  You should go home directly after your procedure and rest.  You can resume normal activities the  day after your procedure.  The day of your procedure you should not:   Drive   Make legal decisions   Operate machinery   Drink alcohol   Return to work  You will receive specific instructions about eating, activities and medications before you leave.    The above instructions have been reviewed and explained to me by   Wyona Almas RN  February 10, 2011 2:22 PM     I fully understand and can verbalize these  instructions _____________________________ Date _________

## 2011-02-17 NOTE — Miscellaneous (Signed)
Summary: [facility] Previsit/prep  Clinical Lists Changes  Medications: Added new medication of MOVIPREP 100 GM  SOLR (PEG-KCL-NACL-NASULF-NA ASC-C) As per prep instructions. - Signed Rx of MOVIPREP 100 GM  SOLR (PEG-KCL-NACL-NASULF-NA ASC-C) As per prep instructions.;  #1 x 0;  Signed;  Entered by: Wyona Almas RN;  Authorized by: Iva Boop MD, [facility];  Method used: Electronically to [id number]*, [address] [place], [place], [place]  [address], Ph: [phone], Fax: [phone] Observations: Added new observation of NKA: T (02/10/2011 14:23)    Prescriptions: MOVIPREP 100 GM  SOLR (PEG-KCL-NACL-NASULF-NA ASC-C) As per prep instructions.  #1 x 0   Entered by:   Wyona Almas RN   Authorized by:   Iva Boop MD, [facility]   Signed by:   Wyona Almas RN on 02/10/2011   Method used:   Electronically to        [facility] [address]* (retail)       [address] [place], [place]  [address]       Ph: [phone]       Fax: [phone]   RxID:   [id number]

## 2011-02-19 ENCOUNTER — Other Ambulatory Visit (AMBULATORY_SURGERY_CENTER): Payer: BC Managed Care – PPO | Admitting: Internal Medicine

## 2011-02-19 ENCOUNTER — Other Ambulatory Visit: Payer: Self-pay | Admitting: Internal Medicine

## 2011-02-19 DIAGNOSIS — D128 Benign neoplasm of rectum: Secondary | ICD-10-CM

## 2011-02-19 DIAGNOSIS — D126 Benign neoplasm of colon, unspecified: Secondary | ICD-10-CM

## 2011-02-19 DIAGNOSIS — Z1211 Encounter for screening for malignant neoplasm of colon: Secondary | ICD-10-CM

## 2011-02-19 DIAGNOSIS — D129 Benign neoplasm of anus and anal canal: Secondary | ICD-10-CM

## 2011-02-19 DIAGNOSIS — K644 Residual hemorrhoidal skin tags: Secondary | ICD-10-CM

## 2011-02-19 DIAGNOSIS — K648 Other hemorrhoids: Secondary | ICD-10-CM

## 2011-02-24 ENCOUNTER — Encounter: Payer: Self-pay | Admitting: Internal Medicine

## 2011-02-24 ENCOUNTER — Telehealth: Payer: Self-pay | Admitting: Internal Medicine

## 2011-02-26 NOTE — Procedures (Addendum)
Summary: Colonoscopy  Patient: [name] Note: All result statuses are Final unless otherwise noted.  Tests: (1) Colonoscopy (COL)   COL Colonoscopy           DONE     [facility]     [address]     [place], [place]  [address]          COLONOSCOPY PROCEDURE REPORT          PATIENT:  Denise, Park  MR#:  [id number]     BIRTHDATE:  08-02-1956, 54 yrs. old  GENDER:  female     ENDOSCOPIST:  Iva Boop, MD, [facility]          PROCEDURE DATE:  02/19/2011     PROCEDURE:  Colonoscopy with snare polypectomy     ASA CLASS:  Class I     INDICATIONS:  Routine Risk Screening     MEDICATIONS:   Benadryl 12.5 mg IV, Versed 12 mg IV, Fentanyl 100     mcg          DESCRIPTION OF PROCEDURE:   After the risks benefits and     alternatives of the procedure were thoroughly explained, informed     consent was obtained.  Digital rectal exam was performed and     revealed no abnormalities.   The LB PCF-H180AL [id number] endoscope     was introduced through the anus and advanced to the cecum, which     was identified by both the appendix and ileocecal valve, without     limitations.  The quality of the prep was good, using MoviPrep.     The instrument was then slowly withdrawn as the colon was fully     examined. Insertion: 8:00 minutes Withdrawal: 15:50 minutes     <<PROCEDUREIMAGES>>          FINDINGS:  A diminutive polyp (<15mm) was found in the     recto-sigmoid colon. Polyp was snared without cautery. Retrieval     was successful. snare polyp  This was otherwise a normal     examination of the colon.   Retroflexed views in the rectum     revealed internal and external hemorrhoids.  Small.  The scope was     then withdrawn from the patient and the procedure completed.          COMPLICATIONS:  None     ENDOSCOPIC IMPRESSION:     1) Diminutive polyp in the recto-sigmoid colon - removed     2) Internal and external hemorrhoids     3) Otherwise normal examination  RECOMMENDATIONS:   Use deep sedation in the future.          REPEAT EXAM:  In for Colonoscopy, pending biopsy results.          Iva Boop, MD, [facility]          CC:  Corwin Levins, MD     The Patient          n.     Rosalie Doctor:   Iva Boop at 02/19/2011 10:57 AM          [name], [id number]  Note: An exclamation mark (!) indicates a result that was not dispersed into the flowsheet. Document Creation Date: 02/19/2011 10:58 AM _______________________________________________________________________  (1) Order result status: Final Collection or observation date-time: 02/19/2011 10:49 Requested date-time:  Receipt date-time:  Reported date-time:  Referring Physician:   Ordering Physician: Stan Head [phone]) Specimen Source:  Source: Launa Grill Order Number: [id number] Lab site:   Appended Document: Colonoscopy     Procedures Next Due Date:    Colonoscopy: 02/2021

## 2011-03-03 NOTE — Letter (Signed)
Summary: Patient Notice-Hyperplastic Polyps  [facility]  [address] [place], [place] [address]   Phone: [phone]  Fax: [phone]        February 24, 2011 MRN: [id number]    [facility] [address] [place], [place]  [address]    Dear Ms. BEHRLE,  I am pleased to inform you that the colon polyp removed during your recent colonoscopy was NOT pre-cancerous.  It is therefore my recommendation that you have a repeat colonoscopy examination in 10 years for routine colorectal cancer screening.  Should you develop new or worsening symptoms of abdominal pain, bowel habit changes or bleeding from the rectum or bowels, please schedule an evaluation with either your primary care physician or with me.  In addition to repeating colonoscopy, changing health habits may reduce your risk of having colon or rectal  polyps and possibly, colorectal cancer. You may lower your risk of future polyps and colorectal cancer by adopting healthy habits such as not smoking or using tobacco (if you do), being physically active, losing weight (if overweight), and eating a diet which includes fruits and vegetables and limits red meat.  Please call us if you are having persistent problems or have questions about your condition that have not been fully answered at this time.  Sincerely,  Iva Boop MD, [facility] This letter has been electronically signed by your physician.  Appended Document: Patient Notice-Hyperplastic Polyps letter mailed

## 2011-03-03 NOTE — Progress Notes (Signed)
Summary: Medication refill  Phone Note Refill Request Message from:  Fax from Pharmacy on February 24, 2011 2:42 PM  Refills Requested: Medication #1:  ALPRAZOLAM 0.5 MG TABS Take 1 tablet by mouth twice a day as needed   Dosage confirmed as above?Dosage Confirmed   Last Refilled: 07/25/2010   Notes: [facility] [address] [place] fax#[phone] Initial call taken by: Zella Ball Ewing CMA Duncan Dull),  February 24, 2011 2:43 PM  Follow-up for Phone Call        faxed prescription to pharmacy Follow-up by: Robin Ewing CMA Duncan Dull),  February 25, 2011 8:09 AM    New/Updated Medications: ALPRAZOLAM 0.5 MG TABS (ALPRAZOLAM) Take 1 tablet by mouth twice a day as needed  -  please make return office visit about may 2012 for further refills Prescriptions: ALPRAZOLAM 0.5 MG TABS (ALPRAZOLAM) Take 1 tablet by mouth twice a day as needed  -  please make return office visit about may 2012 for further refills  #60 x 2   Entered and Authorized by:   Corwin Levins MD   Signed by:   Corwin Levins MD on 02/24/2011   Method used:   Print then Give to Patient   RxID:   [id number]  done hardcopy to LIM side B - dahlia Corwin Levins MD  February 24, 2011 5:20 PM

## 2011-05-08 NOTE — Op Note (Signed)
NAME:  Denise Park, Denise Park                         ACCOUNT NO.:  [id number]   MEDICAL RECORD NO.:  [id number]                   PATIENT TYPE:  INP   LOCATION:  9116                                 [facility]ILITY:  [facility]   PHYSICIAN:  Zenaida Niece, M.D.             DATE OF BIRTH:  [birth year]   DATE OF PROCEDURE:  08/06/2003  DATE OF DISCHARGE:                                 OPERATIVE REPORT   PREOPERATIVE DIAGNOSIS:  Intrauterine pregnancy at 66 weeks, advanced  maternal age, previous cesarean section, desires surgical sterility,  placenta praevia.   POSTOPERATIVE DIAGNOSIS:  Intrauterine pregnancy at 39 weeks, advanced  maternal age, previous cesarean section, desires surgical sterility,  placenta praevia.   PROCEDURE:  Repeat low transverse cesarean section, bilateral partial  salpingectomy.   SURGEON:  Zenaida Niece, M.D.   ASSISTANT:  Malachi Pro. Ambrose Mantle, M.D.   ANESTHESIA:  Epidural.   ESTIMATED BLOOD LOSS:  1000 mL.   FINDINGS:  The patient delivered a viable female infant with Apgars 8 and 9,  weight 6 pounds 12 ounces, and was in the vertex presentation at delivery  and was in an oblique presentation with the head in the right lower quadrant  prior to delivery.  After delivery of the placenta, there was a fair amount  of bleeding from the lower uterine segment.  The uterus was exteriorized to  perform the tubal ligation and there was bleeding from the posterior uterine  serosa.  Both ovaries appear adherent to the broad ligaments.   PROCEDURE IN DETAIL:  The patient was taken to the operating room and placed  in a sitting position.  Dr. Cristela Blue instilled epidural anesthesia.  She  was then placed in the dorsal supine position with a left lateral tilt.  Her  abdomen was prepped and draped in the usual sterile fashion and a Foley  catheter inserted.  The level of her anesthesia was found to be adequate and  her abdomen was entered via her previous Pfannenstiel  incision.  The  vesicouterine peritoneum was incised and a bladder flap created digitally.  A 4 cm transverse incision was made in the lower uterine segment after the  vertex was pushed from the right lower quadrant to just below the cervix in  the midline.  When the uterus was entered, the placenta was pushed inferior,  and the uterine incision was extended bilaterally digitally.  Membranes were  ruptured with return of clear fluid.  The fetal vertex was easily grasped  and delivered through the incision atraumatically.  The mouth and nares were  suctioned.  The remainder of the infant delivered atraumatically.  The cord  was doubly clamped and cut and the infant handed to the awaiting pediatric  team.  Cord blood and cord gas were obtained.  The cord was noted to have a  true knot in it.  The placenta then delivered spontaneously and appeared to  be intact.  The upper uterus was wiped with a clean lap pad and found to be  normal.  Initially, there was no significant bleeding.  However, there  gradually developed some significant bleeding in the lower uterine segment.  This was posteriorly.  This was controlled with interrupted figure-of-eight  sutures of #1 chromic which eventually did achieve hemostasis.  The uterine  incision was inspected and found to be hemostatic.  The uterine incision was  closed in one layer of running locking layer with #1 chromic with adequate  hemostasis.   Attention was turned to tubal ligation.  The left fallopian tube was  identified and traced to its fimbriated end.  A window was made in an  avascular portion of the broad ligament.  This knuckle of tube was then  ligated with 0 plain gut suture.  The knuckle of tube was removed.  Both  ostia were identified and the stumps were hemostatic.  There appeared to be  some bleeding that kept welling up and so the uterus was exteriorized.  The  tubal site was fine.  However, there was noted to be some bleeding from  what  appeared to be some tearing in the posterior uterine serosa.  A piece of  this was removed and sent as a specimen.  To control this bleeding required  electrocautery and several sutures of 3-0 Vicryl, both in interrupted and  running locking fashion.  Hemostasis was eventually achieved.  The right  fallopian tube was identified and traced to its fimbriated end.  A similar  procedure was performed as on the left.  The knuckle of tube was ligated  with 0 plain gut suture and removed sharply.  Both ostia were identified and  the stump was hemostatic.  The uterus was then returned to the abdomen.  The  uterine incision was again inspected and found to be hemostatic.  The  subfascial space was irrigated and rendered hemostatic with electrocautery  and with 3-0 Vicryl sutures for a couple of bleeding areas.  The fascia was  closed in a running fashion starting at both ends and meeting in the middle  with 0 Vicryl.  The rectus muscle was reapproximated in the midline with  interrupted sutures of #1 chromic due to a wide diastasis.  This was done  prior to the closure of the fascia.  The subcutaneous tissue was made  hemostatic with electrocautery after irrigation.  The skin was closed with  staples and a sterile dressing.  The patient tolerated the procedure well  and was taken to the recovery room in stable condition.  Counts were correct  x 2.  She was given Ancef 1 gram after cord clamp.                                               Zenaida Niece, M.D.    TDM/MEDQ  D:  08/06/2003  T:  08/06/2003  Job:  [id number]

## 2011-05-08 NOTE — Discharge Summary (Signed)
Denise Park, Denise Park                         ACCOUNT NO.:  [id number]   MEDICAL RECORD NO.:  [id number]                   PATIENT TYPE:  INP   LOCATION:  9116                                 FACILITY:  [facility]   PHYSICIAN:  Zenaida Niece, M.D.             DATE OF BIRTH:  03-Aug-1956   DATE OF ADMISSION:  08/06/2003  DATE OF DISCHARGE:                                 DISCHARGE SUMMARY   ADMISSION DIAGNOSES:  1. Intrauterine pregnancy at 39 weeks.  2. Advanced maternal age.  3. Previous cesarean section.  4. Desires surgical sterility.  5. Placenta previa.   DISCHARGE DIAGNOSES:  1. Intrauterine pregnancy at 39 weeks.  2. Advanced maternal age.  3. Previous cesarean section.  4. Desires surgical sterility.  5. Placenta previa.   PROCEDURE:  Repeat low transverse cesarean section and bilateral partial  salpingectomy.   HISTORY AND PHYSICAL:  Please see chart for full history and physical, but  briefly this is a 55 year old white female gravida 6 para 3-0-2-3 with an  EGA of [redacted] weeks who presents for repeat cesarean section.  The patient  declines a VBAC but also has a complete placenta previa.  She has advanced  maternal age but declined amniocentesis and had a 1:7 risk of Downs syndrome  by ultrasound.  Physical exam significant for a gravid abdomen with a fundal  height of 36 cm and an oblique presentation, and cervical exam was deferred  due to the placenta previa.   HOSPITAL COURSE:  The patient was admitted on the day of surgery and  underwent a repeat low transverse cesarean section and tubal ligation under  epidural anesthesia.  Estimated blood loss was 1000 mL.  She delivered a  viable female infant with Apgars of 8 and 9 that weighed 6 pounds 12 ounces.  She did have some bleeding from the posterior lower uterine segment and the  posterior uterine serosa which was controlled with electrocautery and  sutures.  Postpartum she had no complications.  Predelivery  hemoglobin 12.,  postdelivery 10.2  On the morning of postoperative day #2 she requested  discharge and was felt to be stable enough for discharge home.   DISCHARGE INSTRUCTIONS:  1. Regular diet.  2. Pelvic rest.  3. No strenuous activity.  4.     Follow-up is in two days for staple removal.  5. Medications are Percocet #40 one to two p.o. q.4-6h. p.r.n. pain and/or     over-the-counter Motrin as needed.  6. She was given our discharge pamphlet.                                               Zenaida Niece, M.D.    TDM/MEDQ  D:  08/08/2003  T:  08/08/2003  Job:  [id number]

## 2011-05-08 NOTE — H&P (Signed)
Denise Park, Denise Park                           ACCOUNT NO.:  [id number]   MEDICAL RECORD NO.:  [id number]                    PATIENT TYPE:   LOCATION:                                       FACILITY:   PHYSICIAN:  Zenaida Niece, M.D.             DATE OF BIRTH:   DATE OF ADMISSION:  08/06/2003  DATE OF DISCHARGE:                                HISTORY & PHYSICAL   CHIEF COMPLAINT:  Repeat cesarean section.   HISTORY OF PRESENT ILLNESS:  This is a 55 year old white female, gravida 6,  para 3-0-2-3, with an EGA of [redacted] weeks by an 11-week ultrasound with a due  date of August 11, 2003, who presents for repeat cesarean section.  The  patient has had one previous cesarean section, declines VBAC, and is  admitted for repeat cesarean section.  Prenatal care complicated by advanced  maternal age for which she declined amniocentesis.  She had a normal triple  screen and did have an ultrasound with an echocardiogenic intracardiac focus  and a slightly short humeral length which put the risk of Down syndrome at 1  in 7.  Again, the patient declined amniocentesis.  The patient also  initially had a placenta previa which has persisted by followup ultrasounds.  She was treated with a Z-Pak at 20 weeks for sinusitis.  Prenatal care has  been otherwise uncomplicated.  Ultrasound to check her placenta on July 09, 2003, did reveal a complete placenta previa with a transverse presentation  and a normal AFI.   PRENATAL LABORATORY DATA:  Blood type is O positive with a negative antibody  screen.  RPR nonreactive.  Rubella immune.  Hepatitis B surface antigen  negative.  Gonorrhea and chlamydia negative.  Triple screen was normal.  One-  hour Glucola 145.  Three-hour GTT 76, 149, 95, and 116.  Group B  Streptococcus was negative.   OBSTETRICAL HISTORY:  In 1977, vaginal delivery at 40 weeks, 6 pounds 5  ounces, no complications.  In 1979, spontaneous abortion treated with a D&C.  In 1981, SVD at 42  weeks, 5 pounds 14 ounces, no complications.  In 1999,  she had a low transverse cesarean section for oligohydramnios and an oblique  presentation at 41 weeks.  The baby weighed 6 pounds 10 ounces.  In 2003,  she had a spontaneous abortion.   PAST MEDICAL HISTORY:  1. History of anemia.  2. History of gastrointestinal reflux.  3. Irritable bowel syndrome.  4. Hiatal hernia.   PAST SURGICAL HISTORY:  1. Appendectomy.  2. D&C.  3. Cesarean section.   ALLERGIES:  None known.   CURRENT MEDICATIONS:  None.   SOCIAL HISTORY:  The patient is married.  She denies alcohol, tobacco, or  drug use.   FAMILY HISTORY:  Noncontributory.   PHYSICAL EXAMINATION:  WEIGHT:  192 pounds.  VITAL SIGNS:  Blood pressure 112/70.  GENERAL APPEARANCE:  She is a well-developed, gravid female in no acute  distress.  NECK:  Supple without lymphadenopathy or thyromegaly.  LUNGS:  Clear to auscultation.  HEART:  Regular rate and rhythm without murmur.  ABDOMEN:  Gravid with a fundal height of 36 cm and oblique presentation with  the head in the right lower quadrant.  She does have a well-healed  transverse scar.  PELVIC:  The vaginal exam is deferred due to the placenta previa.  EXTREMITIES:  1+ edema and nontender.   ASSESSMENT:  1. Intrauterine pregnancy at 39 weeks.  2. Previous cesarean section.  The patient is cleared for a trial of labor,     but declines this and wishes to proceed with repeat cesarean section.     The risks of surgery have been discussed with the patient, including     bleeding, infection, and damage to surrounding organs.  The patient does     also wish to have a tubal ligation at the time of her cesarean section.  3. Complete placenta previa.  The patient is aware that this may cause     significant bleeding which may require a hysterectomy.  4. Advanced maternal age with a 1 in 7 risk of Down syndrome by ultrasound.   PLAN:  Admit the patient for repeat cesarean section  and tubal ligation.  Again, the patient does understand that hysterectomy is a possibility due to  the placenta previa.                                               Zenaida Niece, M.D.    TDM/MEDQ  D:  08/03/2003  T:  08/03/2003  Job:  [id number]

## 2011-05-19 ENCOUNTER — Other Ambulatory Visit (INDEPENDENT_AMBULATORY_CARE_PROVIDER_SITE_OTHER): Payer: BC Managed Care – PPO

## 2011-05-19 DIAGNOSIS — Z0000 Encounter for general adult medical examination without abnormal findings: Secondary | ICD-10-CM

## 2011-05-19 LAB — URINALYSIS
Bilirubin Urine: NEGATIVE
Nitrite: NEGATIVE
Total Protein, Urine: NEGATIVE
pH: 8.5 (ref 5.0–8.0)

## 2011-05-19 LAB — LIPID PANEL
Cholesterol: 158 mg/dL (ref 0–200)
HDL: 63 mg/dL (ref >39.00)

## 2011-05-19 LAB — CBC WITH DIFFERENTIAL/PLATELET
Basophils Absolute: 0 10*3/uL (ref 0.0–0.1)
Eosinophils Absolute: 0.3 10*3/uL (ref 0.0–0.7)
HCT: 39.3 % (ref 36.0–46.0)
Hemoglobin: 13.3 g/dL (ref 12.0–15.0)
Lymphs Abs: 1.6 10*3/uL (ref 0.7–4.0)
MCHC: 33.9 g/dL (ref 30.0–36.0)
Monocytes Absolute: 0.5 10*3/uL (ref 0.1–1.0)
Neutro Abs: 3.5 10*3/uL (ref 1.4–7.7)
RDW: 12.9 % (ref 11.5–14.6)

## 2011-05-19 LAB — BASIC METABOLIC PANEL
Calcium: 8.7 mg/dL (ref 8.4–10.5)
GFR: 64.66 mL/min (ref >60.00)
Glucose, Bld: 84 mg/dL (ref 70–99)
Sodium: 139 mEq/L (ref 135–145)

## 2011-05-19 LAB — HEPATIC FUNCTION PANEL
AST: 20 U/L (ref 0–37)
Albumin: 3.3 g/dL — ABNORMAL LOW (ref 3.5–5.2)
Alkaline Phosphatase: 57 U/L (ref 39–117)
Total Bilirubin: 0.4 mg/dL (ref 0.3–1.2)

## 2011-05-20 ENCOUNTER — Other Ambulatory Visit: Payer: Self-pay | Admitting: Internal Medicine

## 2011-05-20 ENCOUNTER — Encounter: Payer: Self-pay | Admitting: Internal Medicine

## 2011-05-20 DIAGNOSIS — R5381 Other malaise: Secondary | ICD-10-CM

## 2011-05-20 DIAGNOSIS — Z0000 Encounter for general adult medical examination without abnormal findings: Secondary | ICD-10-CM

## 2011-05-20 DIAGNOSIS — Z0001 Encounter for general adult medical examination with abnormal findings: Secondary | ICD-10-CM | POA: Insufficient documentation

## 2011-05-22 ENCOUNTER — Encounter: Payer: Self-pay | Admitting: Internal Medicine

## 2011-05-22 ENCOUNTER — Ambulatory Visit (INDEPENDENT_AMBULATORY_CARE_PROVIDER_SITE_OTHER)
Admission: RE | Admit: 2011-05-22 | Discharge: 2011-05-22 | Disposition: A | Discharge status: Home / Self Care | Payer: BC Managed Care – PPO | Source: Ambulatory Visit

## 2011-05-22 ENCOUNTER — Ambulatory Visit (INDEPENDENT_AMBULATORY_CARE_PROVIDER_SITE_OTHER): Payer: BC Managed Care – PPO | Admitting: Internal Medicine

## 2011-05-22 VITALS — BP 120/80 | HR 73 | Temp 98.3°F | Ht 66.0 in | Wt 153.4 lb

## 2011-05-22 DIAGNOSIS — M8580 Other specified disorders of bone density and structure, unspecified site: Secondary | ICD-10-CM

## 2011-05-22 DIAGNOSIS — F329 Major depressive disorder, single episode, unspecified: Secondary | ICD-10-CM

## 2011-05-22 DIAGNOSIS — Z0000 Encounter for general adult medical examination without abnormal findings: Secondary | ICD-10-CM

## 2011-05-22 DIAGNOSIS — M949 Disorder of cartilage, unspecified: Secondary | ICD-10-CM

## 2011-05-22 DIAGNOSIS — M899 Disorder of bone, unspecified: Secondary | ICD-10-CM

## 2011-05-22 DIAGNOSIS — M7710 Lateral epicondylitis, unspecified elbow: Secondary | ICD-10-CM | POA: Insufficient documentation

## 2011-05-22 DIAGNOSIS — J309 Allergic rhinitis, unspecified: Secondary | ICD-10-CM

## 2011-05-22 HISTORY — DX: Other specified disorders of bone density and structure, unspecified site: M85.80

## 2011-05-22 MED ORDER — MELOXICAM 15 MG PO TABS: 15.0000 mg | ORAL_TABLET | Freq: Every day | ORAL | Status: DC

## 2011-05-22 MED ORDER — ALPRAZOLAM 0.5 MG PO TABS: 0.5000 mg | ORAL_TABLET | Freq: Two times a day (BID) | ORAL | Status: DC | PRN

## 2011-05-22 MED ORDER — FLUOXETINE HCL 20 MG PO CAPS: 20.0000 mg | ORAL_CAPSULE | Freq: Every day | ORAL | Status: DC

## 2011-05-22 MED ORDER — ZOLPIDEM TARTRATE 10 MG PO TABS: 10.0000 mg | ORAL_TABLET | Freq: Every evening | ORAL | Status: DC | PRN

## 2011-05-22 NOTE — Assessment & Plan Note (Signed)
Mild to mod, but persistent for over 4 wks, for mobic prn, consider ortho if worsens or persists

## 2011-05-22 NOTE — Assessment & Plan Note (Signed)
Improved this season, does not want to take zyrtec further, ok for allegra otc prn

## 2011-05-22 NOTE — Assessment & Plan Note (Signed)
D/w pt results of last dxa;  For repeat dxa, cont calcium/vitd and wt bearing excercise 

## 2011-05-22 NOTE — Assessment & Plan Note (Signed)
Overall doing well, age appropriate education and counseling updated, referrals for preventative services and immunizations addressed, dietary and smoking counseling addressed, most recent labs and ECG reviewed.  I have personally reviewed and have noted: 1) the patient's medical and social history 2) The pt's use of alcohol, tobacco, and illicit drugs 3) The patient's current medications and supplements 4) Functional ability including ADL's, fall risk, home safety risk, hearing and visual impairment 5) Diet and physical activities 6) Evidence for depression or mood disorder 7) The patient's height, weight, and BMI have been recorded in the chart I have made referrals, and provided counseling and education based on review of the above Decliens immunizations today

## 2011-05-22 NOTE — Patient Instructions (Signed)
Ok to stop the bupropion Continue all other medications as before Please stop at the desk as you leave to have the Bone Density scheduled Please return in 1 year for your yearly visit, or sooner if needed, with Lab testing done 3-5 days before

## 2011-05-22 NOTE — Progress Notes (Signed)
Subjective:    Patient ID: Denise Park, female    DOB: Oct 01, 1956, 55 y.o.   MRN: [id number]  HPI Here for wellness and f/u;  Overall doing ok;  Pt denies CP, worsening SOB, DOE, wheezing, orthopnea, PND, worsening LE edema, palpitations, dizziness or syncope.  Pt denies neurological change such as new Headache, facial or extremity weakness.  Pt denies polydipsia, polyuria, or low sugar symptoms. Pt states overall good compliance with treatment and medications, good tolerability, and trying to follow lower cholesterol diet.  Pt denies worsening depressive symptoms, suicidal ideation or panic. No fever, wt loss, night sweats, loss of appetite, or other constitutional symptoms.  Pt states good ability with ADL's, low fall risk, home safety reviewed and adequate, no significant changes in hearing or vision, and occasionally active with exercise. Overall lost 5 lbs,  Due for dxa.  Also with 2-3 months recurring pain to the right elbow area, worse to flex elbow picking up heavy objects as well as supination/pronation movements. 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  Does have several wks ongoing nasal allergy symptoms with clear congestion, itch and sneeze, without fever, pain, ST, cough or wheezing.  Past Medical History  Diagnosis Date  . ALLERGIC RHINITIS 10/30/2007  . ANEMIA-IRON DEFICIENCY 10/30/2007  . ANXIETY 10/30/2007  . BACK PAIN 02/13/2008  . COMMON MIGRAINE 10/30/2007  . DEPRESSION 10/30/2007  . GERD 10/30/2007  . HYPERSOMNIA 09/11/2008  . INSOMNIA-SLEEP DISORDER-UNSPEC 09/11/2008  . IRRITABLE BOWEL SYNDROME, HX OF 10/30/2007  . OSTEOPENIA 02/13/2008  . PELVIC PAIN, CHRONIC 05/14/2009  . SINUSITIS- ACUTE-NOS 02/13/2008   Past Surgical History  Procedure Date  . Cesarean section   . Tubal ligation     reports that she has never smoked. She does not have any  smokeless tobacco history on file. She reports that she drinks alcohol. She reports that she does not use illicit drugs. family history includes Coronary artery disease in her other and Depression in her other. No Known Allergies Current Outpatient Prescriptions on File Prior to Visit  Medication Sig Dispense Refill  . ALPRAZolam (XANAX) 0.5 MG tablet Take 0.5 mg by mouth 2 (two) times daily as needed.        Marland Kitchen buPROPion (WELLBUTRIN SR) 150 MG 12 hr tablet Take 150 mg by mouth daily.        Marland Kitchen CALCIUM-VITAMIN D PO Take by mouth daily.        Marland Kitchen FLUoxetine (PROZAC) 20 MG capsule Take 20 mg by mouth daily.        Marland Kitchen zolpidem (AMBIEN) 10 MG tablet Take 10 mg by mouth at bedtime as needed.        Marland Kitchen DISCONTD: norethindrone-ethinyl estradiol (OVCON) 0.4-35 MG-MCG per tablet Take 1 tablet by mouth daily.        . cetirizine (ZYRTEC) 10 MG tablet Take 10 mg by mouth daily.        . meloxicam (MOBIC) 15 MG tablet Take 15 mg by mouth daily.        Marland Kitchen DISCONTD: Ascorbic Acid (VITAMIN C) 1000 MG tablet Take 1,000 mg by mouth daily.         Review of Systems Review of Systems  Constitutional: Negative for diaphoresis, activity change, appetite change and unexpected weight change.  HENT: Negative for hearing loss, ear pain, facial swelling, mouth sores and neck stiffness.   Eyes: Negative for pain, redness  and visual disturbance.  Respiratory: Negative for shortness of breath and wheezing.   Cardiovascular: Negative for chest pain and palpitations.  Gastrointestinal: Negative for diarrhea, blood in stool, abdominal distention and rectal pain.  Genitourinary: Negative for hematuria, flank pain and decreased urine volume.  Musculoskeletal: Negative for myalgias and joint swelling.  Skin: Negative for color change and wound.  Neurological: Negative for syncope and numbness.  Hematological: Negative for adenopathy.  Psychiatric/Behavioral: Negative for hallucinations, self-injury, decreased concentration and  agitation.      Objective:   Physical Exam BP 120/80  Pulse 73  Temp(Src) 98.3 F (36.8 C) (Oral)  Ht 5\' 6"  (1.676 m)  Wt 153 lb 6 oz (69.57 kg)  BMI 24.76 kg/m2  SpO2 98%  LMP 03/31/2011 Physical Exam  VS noted Constitutional: Pt is oriented to person, place, and time. Appears well-developed and well-nourished.  HENT:  Head: Normocephalic and atraumatic.  Right Ear: External ear normal.  Left Ear: External ear normal.  Nose: Nose normal.  Mouth/Throat: Oropharynx is clear and moist.  Eyes: Conjunctivae and EOM are normal. Pupils are equal, round, and reactive to light.  Neck: Normal range of motion. Neck supple. No JVD present. No tracheal deviation present.  Cardiovascular: Normal rate, regular rhythm, normal heart sounds and intact distal pulses.   Pulmonary/Chest: Effort normal and breath sounds normal.  Abdominal: Soft. Bowel sounds are normal. There is no tenderness.  Musculoskeletal: Normal range of motion. Exhibits no edema.  Right lateral epicondyle mild tender, no swelilng Lymphadenopathy:  Has no cervical adenopathy.  Neurological: Pt is alert and oriented to person, place, and time. Pt has normal reflexes. No cranial nerve deficit. Motor/dtr's/gait normal  Skin: Skin is warm and dry. No rash noted.  Psychiatric:  Has  normal mood and affect. Behavior is normal. 1+ nervous, not depressed appearing        Assessment & Plan:

## 2011-05-22 NOTE — Assessment & Plan Note (Addendum)
Ok to stop the wellbutrin,  to f/u any worsening symptoms or concerns and Continue all other medications as before

## 2011-05-22 NOTE — Assessment & Plan Note (Signed)
D/w pt results of last dxa;  For repeat dxa, cont calcium/vitd and wt bearing excercise

## 2011-05-29 ENCOUNTER — Other Ambulatory Visit: Payer: Self-pay | Admitting: Internal Medicine

## 2011-06-02 ENCOUNTER — Encounter: Payer: Self-pay | Admitting: Internal Medicine

## 2011-11-23 ENCOUNTER — Other Ambulatory Visit: Payer: Self-pay

## 2011-11-23 MED ORDER — ALPRAZOLAM 0.5 MG PO TABS: 0.5000 mg | ORAL_TABLET | Freq: Two times a day (BID) | ORAL | Status: DC | PRN

## 2011-11-23 MED ORDER — ZOLPIDEM TARTRATE 10 MG PO TABS: 10.0000 mg | ORAL_TABLET | Freq: Every evening | ORAL | Status: DC | PRN

## 2011-11-23 NOTE — Telephone Encounter (Signed)
Done hardcopy to [facility]  

## 2011-11-24 NOTE — Telephone Encounter (Signed)
Faxed hardcopy to pharmacy. 

## 2012-05-17 ENCOUNTER — Other Ambulatory Visit (INDEPENDENT_AMBULATORY_CARE_PROVIDER_SITE_OTHER): Payer: BC Managed Care – PPO

## 2012-05-17 DIAGNOSIS — Z0000 Encounter for general adult medical examination without abnormal findings: Secondary | ICD-10-CM

## 2012-05-17 LAB — URINALYSIS, ROUTINE W REFLEX MICROSCOPIC
Hgb urine dipstick: NEGATIVE
Nitrite: NEGATIVE
Specific Gravity, Urine: 1.025 (ref 1.000–1.030)
Total Protein, Urine: NEGATIVE
Urobilinogen, UA: 0.2 (ref 0.0–1.0)

## 2012-05-17 LAB — CBC WITH DIFFERENTIAL/PLATELET
Basophils Absolute: 0 10*3/uL (ref 0.0–0.1)
Eosinophils Absolute: 0.4 10*3/uL (ref 0.0–0.7)
HCT: 41.4 % (ref 36.0–46.0)
Lymphs Abs: 1.7 10*3/uL (ref 0.7–4.0)
MCV: 89.1 fl (ref 78.0–100.0)
Monocytes Absolute: 0.6 10*3/uL (ref 0.1–1.0)
Platelets: 201 10*3/uL (ref 150.0–400.0)
RDW: 13.4 % (ref 11.5–14.6)

## 2012-05-17 LAB — TSH: TSH: 3.33 u[IU]/mL (ref 0.35–5.50)

## 2012-05-17 LAB — LIPID PANEL
Cholesterol: 160 mg/dL (ref 0–200)
LDL Cholesterol: 89 mg/dL (ref 0–99)
Triglycerides: 36 mg/dL (ref 0.0–149.0)
VLDL: 7.2 mg/dL (ref 0.0–40.0)

## 2012-05-17 LAB — HEPATIC FUNCTION PANEL: Total Bilirubin: 0.4 mg/dL (ref 0.3–1.2)

## 2012-05-23 ENCOUNTER — Ambulatory Visit (INDEPENDENT_AMBULATORY_CARE_PROVIDER_SITE_OTHER): Payer: BC Managed Care – PPO | Admitting: Internal Medicine

## 2012-05-23 ENCOUNTER — Encounter: Payer: Self-pay | Admitting: Internal Medicine

## 2012-05-23 VITALS — BP 102/68 | HR 63 | Temp 98.2°F | Ht 66.0 in | Wt 154.0 lb

## 2012-05-23 DIAGNOSIS — M5412 Radiculopathy, cervical region: Secondary | ICD-10-CM

## 2012-05-23 DIAGNOSIS — Z0000 Encounter for general adult medical examination without abnormal findings: Secondary | ICD-10-CM

## 2012-05-23 MED ORDER — ZOLPIDEM TARTRATE 10 MG PO TABS: 10.0000 mg | ORAL_TABLET | Freq: Every evening | ORAL | Status: DC | PRN

## 2012-05-23 MED ORDER — ALPRAZOLAM 0.5 MG PO TABS: 0.5000 mg | ORAL_TABLET | Freq: Two times a day (BID) | ORAL | Status: DC | PRN

## 2012-05-23 MED ORDER — FLUOXETINE HCL 20 MG PO CAPS: 20.0000 mg | ORAL_CAPSULE | Freq: Every day | ORAL | Status: DC

## 2012-05-23 MED ORDER — NAPROXEN 500 MG PO TABS: 500.0000 mg | ORAL_TABLET | Freq: Two times a day (BID) | ORAL | Status: DC

## 2012-05-23 NOTE — Assessment & Plan Note (Signed)

## 2012-05-23 NOTE — Patient Instructions (Addendum)
OK to stop the meloxicam Take all new medications as prescribed - the naproxen as needed for pain Continue all other medications as before - your refills were all done today Please have the pharmacy call with any refills you may need in the future You will be contacted regarding the referral for: MRI for the neck, and Neurosurgury referral Please return in 1 year for your yearly visit, or sooner if needed, with Lab testing done 3-5 days before

## 2012-05-23 NOTE — Assessment & Plan Note (Signed)
Mild overall but chronic persistent > 6 mo now mild worse with RUE weakness - for MRi c-spine, and NS referral, also try change nsaid to naproxen prn

## 2012-05-29 ENCOUNTER — Encounter: Payer: Self-pay | Admitting: Internal Medicine

## 2012-05-29 NOTE — Progress Notes (Signed)
Subjective:    Patient ID: Denise Park, female    DOB: 03-08-1956, 56 y.o.   MRN: [id number]  HPI  Here for wellness and f/u;  Overall doing ok;  Pt denies CP, worsening SOB, DOE, wheezing, orthopnea, PND, worsening LE edema, palpitations, dizziness or syncope.  Pt denies neurological change such as new Headache, facial or extremity weakness except has had right base of neck pain assoc with RUE weakness for > 6 mo.   Pt denies polydipsia, polyuria, or low sugar symptoms. Pt states overall good compliance with treatment and medications, good tolerability, and trying to follow lower cholesterol diet.  Pt denies worsening depressive symptoms, suicidal ideation or panic. No fever, wt loss, night sweats, loss of appetite, or other constitutional symptoms.  Pt states good ability with ADL's, low fall risk, home safety reviewed and adequate, no significant changes in hearing or vision, and occasionally active with exercise.   Past Medical History  Diagnosis Date  . ALLERGIC RHINITIS 10/30/2007  . ANEMIA-IRON DEFICIENCY 10/30/2007  . ANXIETY 10/30/2007  . BACK PAIN 02/13/2008  . COMMON MIGRAINE 10/30/2007  . DEPRESSION 10/30/2007  . GERD 10/30/2007  . HYPERSOMNIA 09/11/2008  . INSOMNIA-SLEEP DISORDER-UNSPEC 09/11/2008  . IRRITABLE BOWEL SYNDROME, HX OF 10/30/2007  . OSTEOPENIA 02/13/2008  . PELVIC PAIN, CHRONIC 05/14/2009  . SINUSITIS- ACUTE-NOS 02/13/2008  . Osteopenia 05/22/2011   Past Surgical History  Procedure Date  . Cesarean section   . Tubal ligation     reports that she has never smoked. She does not have any smokeless tobacco history on file. She reports that she drinks alcohol. She reports that she does not use illicit drugs. family history includes Coronary artery disease in her other and Depression in her other. No Known Allergies Current Outpatient Prescriptions on File Prior to Visit  Medication Sig Dispense Refill  . CALCIUM-VITAMIN D PO Take by mouth daily.        Marland Kitchen FLUoxetine (PROZAC)  20 MG capsule Take 1 capsule (20 mg total) by mouth daily.  90 capsule  3  . zolpidem (AMBIEN) 10 MG tablet Take 1 tablet (10 mg total) by mouth at bedtime as needed.  30 tablet  5   Review of Systems Review of Systems  Constitutional: Negative for diaphoresis, activity change, appetite change and unexpected weight change.  HENT: Negative for hearing loss, ear pain, facial swelling, mouth sores and neck stiffness.   Eyes: Negative for pain, redness and visual disturbance.  Respiratory: Negative for shortness of breath and wheezing.   Cardiovascular: Negative for chest pain and palpitations.  Gastrointestinal: Negative for diarrhea, blood in stool, abdominal distention and rectal pain.  Genitourinary: Negative for hematuria, flank pain and decreased urine volume.  Musculoskeletal: Negative for myalgias and joint swelling.  Skin: Negative for color change and wound.  Neurological: Negative for syncope Hematological: Negative for adenopathy.  Psychiatric/Behavioral: Negative for hallucinations, self-injury, decreased concentration and agitation.      Objective:   Physical Exam BP 102/68  Pulse 63  Temp(Src) 98.2 F (36.8 C) (Oral)  Ht 5\' 6"  (1.676 m)  Wt 154 lb (69.854 kg)  BMI 24.86 kg/m2  SpO2 95% Physical Exam  VS noted Constitutional: Pt is oriented to person, place, and time. Appears well-developed and well-nourished.  HENT:  Head: Normocephalic and atraumatic.  Right Ear: External ear normal.  Left Ear: External ear normal.  Nose: Nose normal.  Mouth/Throat: Oropharynx is clear and moist.  Eyes: Conjunctivae and EOM are normal. Pupils are equal, round, and  reactive to light.  Neck: Normal range of motion. Neck supple. No JVD present. No tracheal deviation present.  Cardiovascular: Normal rate, regular rhythm, normal heart sounds and intact distal pulses.   Pulmonary/Chest: Effort normal and breath sounds normal.  Abdominal: Soft. Bowel sounds are normal. There is no  tenderness.  Musculoskeletal: Normal range of motion. Exhibits no edema.  Lymphadenopathy:  Has no cervical adenopathy.  Neurological: Pt is alert and oriented to person, place, and time. Pt has normal reflexes. No cranial nerve deficit. Motor 4+/5 RUE weakness Skin: Skin is warm and dry. No rash noted.  Psychiatric:  Has  normal mood and affect. Behavior is normal.     Assessment & Plan:

## 2012-06-17 ENCOUNTER — Other Ambulatory Visit: Payer: Self-pay | Admitting: Internal Medicine

## 2012-11-28 ENCOUNTER — Other Ambulatory Visit: Payer: Self-pay | Admitting: Internal Medicine

## 2013-02-17 ENCOUNTER — Other Ambulatory Visit: Payer: Self-pay

## 2013-02-17 MED ORDER — ALPRAZOLAM 0.5 MG PO TABS: 0.5000 mg | ORAL_TABLET | Freq: Two times a day (BID) | ORAL | Status: DC | PRN

## 2013-02-17 NOTE — Telephone Encounter (Signed)
Done hardcopy to [facility]  

## 2013-02-17 NOTE — Telephone Encounter (Signed)
Faxed hardcopy to pharmacy. 

## 2013-03-08 ENCOUNTER — Telehealth: Payer: Self-pay | Admitting: Internal Medicine

## 2013-03-08 NOTE — Telephone Encounter (Signed)
Caller: Denise Park/Patient; Phone: [phone]; Reason for Call: Patient calling, needs to speak with someone in billing/finance about what she paid out in 2013.   She can be reached at [phone].

## 2013-05-22 LAB — HM COLONOSCOPY

## 2013-05-24 ENCOUNTER — Encounter: Payer: BC Managed Care – PPO | Admitting: Internal Medicine

## 2013-06-01 ENCOUNTER — Other Ambulatory Visit (INDEPENDENT_AMBULATORY_CARE_PROVIDER_SITE_OTHER): Payer: BC Managed Care – PPO

## 2013-06-01 DIAGNOSIS — Z0000 Encounter for general adult medical examination without abnormal findings: Secondary | ICD-10-CM

## 2013-06-01 LAB — URINALYSIS, ROUTINE W REFLEX MICROSCOPIC
Bilirubin Urine: NEGATIVE
Hgb urine dipstick: NEGATIVE
Nitrite: NEGATIVE
Urobilinogen, UA: 0.2 (ref 0.0–1.0)
WBC, UA: NONE SEEN (ref 0–?)

## 2013-06-01 LAB — BASIC METABOLIC PANEL
Chloride: 106 mEq/L (ref 96–112)
GFR: 84.03 mL/min (ref >60.00)
Potassium: 4.8 mEq/L (ref 3.5–5.1)
Sodium: 142 mEq/L (ref 135–145)

## 2013-06-01 LAB — CBC WITH DIFFERENTIAL/PLATELET
Basophils Relative: 1.1 % (ref 0.0–3.0)
Eosinophils Absolute: 0.3 10*3/uL (ref 0.0–0.7)
Eosinophils Relative: 6.4 % — ABNORMAL HIGH (ref 0.0–5.0)
HCT: 43.8 % (ref 36.0–46.0)
Lymphs Abs: 1.9 10*3/uL (ref 0.7–4.0)
MCHC: 34 g/dL (ref 30.0–36.0)
MCV: 87.9 fl (ref 78.0–100.0)
Monocytes Absolute: 0.5 10*3/uL (ref 0.1–1.0)
Neutrophils Relative %: 47.7 % (ref 43.0–77.0)
Platelets: 236 10*3/uL (ref 150.0–400.0)
WBC: 5.4 10*3/uL (ref 4.5–10.5)

## 2013-06-01 LAB — HEPATIC FUNCTION PANEL
ALT: 16 U/L (ref 0–35)
Bilirubin, Direct: 0.1 mg/dL (ref 0.0–0.3)
Total Bilirubin: 0.7 mg/dL (ref 0.3–1.2)
Total Protein: 7 g/dL (ref 6.0–8.3)

## 2013-06-01 LAB — LIPID PANEL
Cholesterol: 168 mg/dL (ref 0–200)
HDL: 61.5 mg/dL (ref >39.00)
LDL Cholesterol: 92 mg/dL (ref 0–99)
VLDL: 14.2 mg/dL (ref 0.0–40.0)

## 2013-06-09 ENCOUNTER — Encounter: Payer: Self-pay | Admitting: Internal Medicine

## 2013-06-09 ENCOUNTER — Ambulatory Visit (INDEPENDENT_AMBULATORY_CARE_PROVIDER_SITE_OTHER): Payer: BC Managed Care – PPO | Admitting: Internal Medicine

## 2013-06-09 VITALS — BP 112/82 | HR 82 | Temp 98.0°F | Ht 67.0 in | Wt 158.4 lb

## 2013-06-09 DIAGNOSIS — M19049 Primary osteoarthritis, unspecified hand: Secondary | ICD-10-CM

## 2013-06-09 DIAGNOSIS — M19041 Primary osteoarthritis, right hand: Secondary | ICD-10-CM | POA: Insufficient documentation

## 2013-06-09 DIAGNOSIS — F411 Generalized anxiety disorder: Secondary | ICD-10-CM

## 2013-06-09 DIAGNOSIS — Z0000 Encounter for general adult medical examination without abnormal findings: Secondary | ICD-10-CM

## 2013-06-09 MED ORDER — ZOLPIDEM TARTRATE 10 MG PO TABS: 10.0000 mg | ORAL_TABLET | Freq: Every evening | ORAL | Status: DC | PRN

## 2013-06-09 MED ORDER — DICLOFENAC SODIUM 1 % TD GEL
2.0000 g | Freq: Four times a day (QID) | TRANSDERMAL | Status: DC
Start: 2013-06-09 — End: 2014-09-26

## 2013-06-09 MED ORDER — FLUOXETINE HCL 20 MG PO CAPS: 20.0000 mg | ORAL_CAPSULE | Freq: Every day | ORAL | Status: DC

## 2013-06-09 MED ORDER — ALPRAZOLAM 0.5 MG PO TABS: 0.5000 mg | ORAL_TABLET | Freq: Two times a day (BID) | ORAL | Status: DC | PRN

## 2013-06-09 NOTE — Progress Notes (Signed)
Subjective:    Patient ID: Denise Park, female    DOB: 12-04-1956, 57 y.o.   MRN: [id number]  HPI  Here for wellness and f/u;  Overall doing ok;  Pt denies CP, worsening SOB, DOE, wheezing, orthopnea, PND, worsening LE edema, palpitations, dizziness or syncope.  Pt denies neurological change such as new headache, facial or extremity weakness.  Pt denies polydipsia, polyuria, or low sugar symptoms. Pt states overall good compliance with treatment and medications, good tolerability, and has been trying to follow lower cholesterol diet.  Pt denies worsening depressive symptoms, suicidal ideation or panic. No fever, night sweats, wt loss, loss of appetite, or other constitutional symptoms.  Pt states good ability with ADL's, has low fall risk, home safety reviewed and adequate, no other significant changes in hearing or vision, and only occasionally active with exercise.  Started new HRT per GYN with hot flash improved but very expensive.  Went off prozac 3 mo ago but now more irritable and asks for re-start. 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 ALLERGIC RHINITIS 10/30/2007  . ANEMIA-IRON DEFICIENCY 10/30/2007  . ANXIETY 10/30/2007  . BACK PAIN 02/13/2008  . COMMON MIGRAINE 10/30/2007  . DEPRESSION 10/30/2007  . GERD 10/30/2007  . HYPERSOMNIA 09/11/2008  . INSOMNIA-SLEEP DISORDER-UNSPEC 09/11/2008  . IRRITABLE BOWEL SYNDROME, HX OF 10/30/2007  . OSTEOPENIA 02/13/2008  . PELVIC PAIN, CHRONIC 05/14/2009  . SINUSITIS- ACUTE-NOS 02/13/2008  . Osteopenia 05/22/2011   Past Surgical History  Procedure Laterality Date  . Cesarean section    . Tubal ligation      reports that she has never smoked. She does not have any smokeless tobacco history on file. She reports that  drinks alcohol. She reports that she does not use illicit drugs. family history includes Coronary artery disease in her other and Depression in her other. No  Known Allergies Current Outpatient Prescriptions on File Prior to Visit  Medication Sig Dispense Refill  . ALPRAZolam (XANAX) 0.5 MG tablet Take 1 tablet (0.5 mg total) by mouth 2 (two) times daily as needed.  60 tablet  2  . CALCIUM-VITAMIN D PO Take by mouth daily.        . naproxen (NAPROSYN) 500 MG tablet TAKE 1 TABLET BY MOUTH TWICE A DAY WITH A MEAL  60 tablet  1  . zolpidem (AMBIEN) 10 MG tablet Take 1 tablet (10 mg total) by mouth at bedtime as needed.  30 tablet  5  . meloxicam (MOBIC) 15 MG tablet TAKE ONE TABLET BY MOUTH EVERY DAY  90 tablet  3   No current facility-administered medications on file prior to visit.   Review of Systems Constitutional: Negative for diaphoresis, activity change, appetite change or unexpected weight change.  HENT: Negative for hearing loss, ear pain, facial swelling, mouth sores and neck stiffness.   Eyes: Negative for pain, redness and visual disturbance.  Respiratory: Negative for shortness of breath and wheezing.   Cardiovascular: Negative for chest pain and palpitations.  Gastrointestinal: Negative for diarrhea, blood in stool, abdominal distention or other pain Genitourinary: Negative for hematuria, flank pain or change in urine volume.  Musculoskeletal: Negative for myalgias and joint swelling.  Skin: Negative for color change and wound.  Neurological: Negative for syncope and numbness. other than noted Hematological: Negative for adenopathy.  Psychiatric/Behavioral: Negative for hallucinations, self-injury, decreased concentration and agitation.      Objective:   Physical  Exam BP 112/82  Pulse 82  Temp(Src) 98 F (36.7 C) (Oral)  Ht 5\' 7"  (1.702 m)  Wt 158 lb 7 oz (71.867 kg)  BMI 24.81 kg/m2  SpO2 98% VS noted,  Constitutional: Pt is oriented to person, place, and time. Appears well-developed and well-nourished.  Head: Normocephalic and atraumatic.  Right Ear: External ear normal.  Left Ear: External ear normal.  Nose: Nose  normal.  Mouth/Throat: Oropharynx is clear and moist.  Eyes: Conjunctivae and EOM are normal. Pupils are equal, round, and reactive to light.  Neck: Normal range of motion. Neck supple. No JVD present. No tracheal deviation present.  Cardiovascular: Normal rate, regular rhythm, normal heart sounds and intact distal pulses.   Pulmonary/Chest: Effort normal and breath sounds normal.  Abdominal: Soft. Bowel sounds are normal. There is no tenderness. No HSM  Musculoskeletal: Normal range of motion. Exhibits no edema.  Lymphadenopathy:  Has no cervical adenopathy.  Neurological: Pt is alert and oriented to person, place, and time. Pt has normal reflexes. No cranial nerve deficit.  Skin: Skin is warm and dry. No rash noted.  Psychiatric:  Has  normal mood and affect. Behavior is normal.     Assessment & Plan:

## 2013-06-09 NOTE — Assessment & Plan Note (Signed)
Ok change oral nsaids to voltaren gel prn,  to f/u any worsening symptoms or concerns

## 2013-06-09 NOTE — Patient Instructions (Signed)
Your EKG was ok today OK to re-start the prozac Please contact your GYN about the cost of the Hormone prescription, as this may be able to be changed Please continue all other medications as before Please have the pharmacy call with any other refills you may need. Please continue your efforts at being more active, low cholesterol diet, and weight control. You are otherwise up to date with prevention measures today.  Please remember to sign up for My Chart if you have not done so, as this will be important to you in the future with finding out test results, communicating by private email, and scheduling acute appointments online when needed.  Please return in 1 year for your yearly visit, or sooner if needed, with Lab testing done 3-5 days before

## 2013-06-09 NOTE — Assessment & Plan Note (Signed)

## 2013-06-09 NOTE — Assessment & Plan Note (Signed)
stable overall by history and exam, and pt to re-start medical treatment as before,  to f/u any worsening symptoms or concerns

## 2013-06-12 ENCOUNTER — Other Ambulatory Visit: Payer: Self-pay | Admitting: Internal Medicine

## 2013-06-21 ENCOUNTER — Telehealth: Payer: Self-pay | Admitting: *Deleted

## 2013-06-21 NOTE — Telephone Encounter (Signed)
R'cd fax from [facility] in [place], [place] for Denise Park on Voltaren Gel. PA form placed on MD's desk for signature.

## 2013-06-28 NOTE — Telephone Encounter (Signed)
Will close note once receive response will add addendum

## 2013-08-11 ENCOUNTER — Other Ambulatory Visit: Payer: Self-pay | Admitting: Internal Medicine

## 2014-01-06 ENCOUNTER — Other Ambulatory Visit: Payer: Self-pay | Admitting: Internal Medicine

## 2014-01-09 NOTE — Telephone Encounter (Signed)
Faxed hardcopy to [facility]

## 2014-05-21 ENCOUNTER — Other Ambulatory Visit: Payer: Self-pay | Admitting: Internal Medicine

## 2014-05-22 NOTE — Telephone Encounter (Signed)
Done hardcopy to [facility]  

## 2014-05-23 NOTE — Telephone Encounter (Signed)
Faxed hardcopy to [facility]

## 2014-06-26 ENCOUNTER — Encounter: Payer: BC Managed Care – PPO | Admitting: Internal Medicine

## 2014-07-16 ENCOUNTER — Other Ambulatory Visit: Payer: Self-pay | Admitting: Internal Medicine

## 2014-07-17 ENCOUNTER — Other Ambulatory Visit: Payer: Self-pay

## 2014-07-17 MED ORDER — FLUOXETINE HCL 20 MG PO CAPS: 20.0000 mg | ORAL_CAPSULE | Freq: Every day | ORAL | Status: DC

## 2014-09-17 ENCOUNTER — Other Ambulatory Visit: Payer: Self-pay | Admitting: Internal Medicine

## 2014-09-19 ENCOUNTER — Other Ambulatory Visit (INDEPENDENT_AMBULATORY_CARE_PROVIDER_SITE_OTHER): Payer: BC Managed Care – PPO

## 2014-09-19 ENCOUNTER — Telehealth: Payer: Self-pay

## 2014-09-19 DIAGNOSIS — Z0000 Encounter for general adult medical examination without abnormal findings: Secondary | ICD-10-CM

## 2014-09-19 LAB — HEPATIC FUNCTION PANEL
ALBUMIN: 4.5 g/dL (ref 3.5–5.2)
ALT: 15 U/L (ref 0–35)
AST: 22 U/L (ref 0–37)
Alkaline Phosphatase: 87 U/L (ref 39–117)
Bilirubin, Direct: 0.1 mg/dL (ref 0.0–0.3)
TOTAL PROTEIN: 7.7 g/dL (ref 6.0–8.3)
Total Bilirubin: 0.7 mg/dL (ref 0.2–1.2)

## 2014-09-19 LAB — URINALYSIS, ROUTINE W REFLEX MICROSCOPIC
BILIRUBIN URINE: NEGATIVE
HGB URINE DIPSTICK: NEGATIVE
Ketones, ur: NEGATIVE
Leukocytes, UA: NEGATIVE
NITRITE: NEGATIVE
Specific Gravity, Urine: 1.015 (ref 1.000–1.030)
Total Protein, Urine: NEGATIVE
URINE GLUCOSE: NEGATIVE
UROBILINOGEN UA: 0.2 (ref 0.0–1.0)
pH: 7.5 (ref 5.0–8.0)

## 2014-09-19 LAB — BASIC METABOLIC PANEL
BUN: 17 mg/dL (ref 6–23)
CO2: 26 meq/L (ref 19–32)
CREATININE: 0.9 mg/dL (ref 0.4–1.2)
Calcium: 9.9 mg/dL (ref 8.4–10.5)
Chloride: 106 mEq/L (ref 96–112)
GFR: 65.44 mL/min (ref >60.00)
GLUCOSE: 76 mg/dL (ref 70–99)
Potassium: 5 mEq/L (ref 3.5–5.1)
Sodium: 141 mEq/L (ref 135–145)

## 2014-09-19 LAB — CBC WITH DIFFERENTIAL/PLATELET
BASOS PCT: 0.9 % (ref 0.0–3.0)
Basophils Absolute: 0 10*3/uL (ref 0.0–0.1)
EOS PCT: 5.1 % — AB (ref 0.0–5.0)
Eosinophils Absolute: 0.3 10*3/uL (ref 0.0–0.7)
HEMATOCRIT: 43.4 % (ref 36.0–46.0)
HEMOGLOBIN: 14.6 g/dL (ref 12.0–15.0)
LYMPHS ABS: 1.8 10*3/uL (ref 0.7–4.0)
Lymphocytes Relative: 31.7 % (ref 12.0–46.0)
MCHC: 33.7 g/dL (ref 30.0–36.0)
MCV: 86.9 fl (ref 78.0–100.0)
MONOS PCT: 8.8 % (ref 3.0–12.0)
Monocytes Absolute: 0.5 10*3/uL (ref 0.1–1.0)
NEUTROS ABS: 3 10*3/uL (ref 1.4–7.7)
Neutrophils Relative %: 53.5 % (ref 43.0–77.0)
Platelets: 232 10*3/uL (ref 150.0–400.0)
RBC: 4.99 Mil/uL (ref 3.87–5.11)
RDW: 13.3 % (ref 11.5–15.5)
WBC: 5.6 10*3/uL (ref 4.0–10.5)

## 2014-09-19 LAB — LIPID PANEL
Cholesterol: 207 mg/dL — ABNORMAL HIGH (ref 0–200)
HDL: 59.6 mg/dL (ref >39.00)
LDL CALC: 132 mg/dL — AB (ref 0–99)
NonHDL: 147.4
Total CHOL/HDL Ratio: 3
Triglycerides: 77 mg/dL (ref 0.0–149.0)
VLDL: 15.4 mg/dL (ref 0.0–40.0)

## 2014-09-19 LAB — TSH: TSH: 1.99 u[IU]/mL (ref 0.35–4.50)

## 2014-09-19 NOTE — Telephone Encounter (Signed)
cpx labs entered  

## 2014-09-26 ENCOUNTER — Encounter: Payer: Self-pay | Admitting: Internal Medicine

## 2014-09-26 ENCOUNTER — Ambulatory Visit (INDEPENDENT_AMBULATORY_CARE_PROVIDER_SITE_OTHER): Payer: BC Managed Care – PPO | Admitting: Internal Medicine

## 2014-09-26 VITALS — BP 108/72 | HR 69 | Temp 98.2°F | Ht 66.0 in | Wt 152.0 lb

## 2014-09-26 DIAGNOSIS — L989 Disorder of the skin and subcutaneous tissue, unspecified: Secondary | ICD-10-CM

## 2014-09-26 DIAGNOSIS — E785 Hyperlipidemia, unspecified: Secondary | ICD-10-CM

## 2014-09-26 DIAGNOSIS — Z0000 Encounter for general adult medical examination without abnormal findings: Secondary | ICD-10-CM

## 2014-09-26 DIAGNOSIS — M8580 Other specified disorders of bone density and structure, unspecified site: Secondary | ICD-10-CM

## 2014-09-26 DIAGNOSIS — F329 Major depressive disorder, single episode, unspecified: Secondary | ICD-10-CM

## 2014-09-26 DIAGNOSIS — F32A Depression, unspecified: Secondary | ICD-10-CM

## 2014-09-26 DIAGNOSIS — G4700 Insomnia, unspecified: Secondary | ICD-10-CM

## 2014-09-26 HISTORY — DX: Hyperlipidemia, unspecified: E78.5

## 2014-09-26 MED ORDER — FLUOXETINE HCL 20 MG PO CAPS: 20.0000 mg | ORAL_CAPSULE | Freq: Every day | ORAL | Status: DC

## 2014-09-26 MED ORDER — ZOLPIDEM TARTRATE 10 MG PO TABS: ORAL_TABLET | ORAL | Status: DC

## 2014-09-26 MED ORDER — NAPROXEN 500 MG PO TABS: 500.0000 mg | ORAL_TABLET | Freq: Two times a day (BID) | ORAL | Status: DC

## 2014-09-26 NOTE — Patient Instructions (Addendum)
Please continue all other medications as before, and refills have been done if requested - the ambien  Please have the pharmacy call with any other refills you may need.  Please continue your efforts at being more active, low cholesterol diet, and weight control.  You are otherwise up to date with prevention measures today.  Please keep your appointments with your specialists as you may have planned  Please remember to followup with your GYN for the yearly pap smear and/or mammogram (for you to call)  Please schedule the bone density test before leaving today at the scheduling desk (where you check out)  You will be contacted regarding the referral for: dermatology  Please return in 1 year for your yearly visit, or sooner if needed, with Lab testing done 3-5 days before

## 2014-09-26 NOTE — Assessment & Plan Note (Signed)
New worsening, for lower chol diet, cut back on eggs she has been eating more lately

## 2014-09-26 NOTE — Addendum Note (Signed)
Addended by: Biagio Borg on: 09/26/2014 09:44 AM   Modules accepted: Orders

## 2014-09-26 NOTE — Assessment & Plan Note (Signed)
Also for ambien refill

## 2014-09-26 NOTE — Assessment & Plan Note (Signed)
Overall stable, for pt to consider stop prozac, can always resume if worse then after

## 2014-09-26 NOTE — Progress Notes (Signed)
Pre visit review using our clinic review tool, if applicable. No additional management support is needed unless otherwise documented below in the visit note. 

## 2014-09-26 NOTE — Progress Notes (Signed)
Subjective:    Patient ID: Denise Park, female    DOB: 1956-08-08, 58 y.o.   MRN: [id number]  HPI     Here for wellness and f/u;  Overall doing ok;  Pt denies CP, worsening SOB, DOE, wheezing, orthopnea, PND, worsening LE edema, palpitations, dizziness or syncope.  Pt denies neurological change such as new headache, facial or extremity weakness.  Pt denies polydipsia, polyuria, or low sugar symptoms. Pt states overall good compliance with treatment and medications, good tolerability, and has been trying to follow lower cholesterol diet.  Pt denies worsening depressive symptoms, suicidal ideation or panic. No fever, night sweats, wt loss, loss of appetite, or other constitutional symptoms.  Pt states good ability with ADL's, has low fall risk, home safety reviewed and adequate, no other significant changes in hearing or vision, and only occasionally active with exercise.  Has been eating more eggs.lately.  Declines flu shot.  Quit smoking x 12 yrs.   Thinking of trying off the prozac but not yet decided, just started new job at [employer].   Needs ambien refill. Past Medical History  Diagnosis Date  . ALLERGIC RHINITIS 10/30/2007  . ANEMIA-IRON DEFICIENCY 10/30/2007  . ANXIETY 10/30/2007  . BACK PAIN 02/13/2008  . COMMON MIGRAINE 10/30/2007  . DEPRESSION 10/30/2007  . GERD 10/30/2007  . HYPERSOMNIA 09/11/2008  . INSOMNIA-SLEEP DISORDER-UNSPEC 09/11/2008  . IRRITABLE BOWEL SYNDROME, HX OF 10/30/2007  . OSTEOPENIA 02/13/2008  . PELVIC PAIN, CHRONIC 05/14/2009  . SINUSITIS- ACUTE-NOS 02/13/2008  . Osteopenia 05/22/2011   Past Surgical History  Procedure Laterality Date  . Cesarean section    . Tubal ligation      reports that she has never smoked. She does not have any smokeless tobacco history on file. She reports that she drinks alcohol. She reports that she does not use illicit drugs. family history includes Coronary artery disease in her other; Depression in her other. No Known Allergies Current  Outpatient Prescriptions on File Prior to Visit  Medication Sig Dispense Refill  . ALPRAZolam (XANAX) 0.5 MG tablet TAKE ONE TABLET BY MOUTH TWICE DAILY AS NEEDED   60 tablet  2  . CALCIUM-VITAMIN D PO Take by mouth daily.        Marland Kitchen FLUoxetine (PROZAC) 20 MG capsule Take 1 capsule (20 mg total) by mouth daily.  30 capsule  0  . zolpidem (AMBIEN) 10 MG tablet TAKE ONE TABLET BY MOUTH AT BEDTIME AS NEEDED   90 tablet  0   No current facility-administered medications on file prior to visit.   Review of Systems Constitutional: Negative for increased diaphoresis, other activity, appetite or other siginficant weight change  HENT: Negative for worsening hearing loss, ear pain, facial swelling, mouth sores and neck stiffness.   Eyes: Negative for other worsening pain, redness or visual disturbance.  Respiratory: Negative for shortness of breath and wheezing.   Cardiovascular: Negative for chest pain and palpitations.  Gastrointestinal: Negative for diarrhea, blood in stool, abdominal distention or other pain Genitourinary: Negative for hematuria, flank pain or change in urine volume.  Musculoskeletal: Negative for myalgias or other joint complaints.  Skin: Negative for color change and wound.  Neurological: Negative for syncope and numbness. other than noted Hematological: Negative for adenopathy. or other swelling Psychiatric/Behavioral: Negative for hallucinations, self-injury, decreased concentration or other worsening agitation.      Objective:   Physical Exam BP 108/72  Pulse 69  Temp(Src) 98.2 F (36.8 C) (Oral)  Ht 5\' 6"  (1.676 m)  Wt 152 lb (68.947 kg)  BMI 24.55 kg/m2  SpO2 96% VS noted, not ill appearing Constitutional: Pt is oriented to person, place, and time. Appears well-developed and well-nourished.  Head: Normocephalic and atraumatic.  Right Ear: External ear normal.  Left Ear: External ear normal.  Nose: Nose normal.  Mouth/Throat: Oropharynx is clear and moist.    Eyes: Conjunctivae and EOM are normal. Pupils are equal, round, and reactive to light.  Neck: Normal range of motion. Neck supple. No JVD present. No tracheal deviation present.  Cardiovascular: Normal rate, regular rhythm, normal heart sounds and intact distal pulses.   Pulmonary/Chest: Effort normal and breath sounds without rales or wheezing  Abdominal: Soft. Bowel sounds are normal. NT. No HSM  Musculoskeletal: Normal range of motion. Exhibits no edema.  Lymphadenopathy:  Has no cervical adenopathy.  Neurological: Pt is alert and oriented to person, place, and time. Pt has normal reflexes. No cranial nerve deficit. Motor grossly intact Skin: Skin is warm and dry. No rash noted. several moles noted Psychiatric:  Has normal mood and affect. Behavior is normal.     Assessment & Plan:

## 2014-09-26 NOTE — Addendum Note (Signed)
Addended by: Biagio Borg on: 09/26/2014 09:48 AM   Modules accepted: Level of Service

## 2014-09-26 NOTE — Assessment & Plan Note (Signed)

## 2014-09-26 NOTE — Assessment & Plan Note (Signed)
Ok for derm referral for full, body check

## 2014-09-26 NOTE — Assessment & Plan Note (Signed)
Also for dxa f/u as he is due

## 2014-10-04 ENCOUNTER — Other Ambulatory Visit: Payer: Self-pay | Admitting: Internal Medicine

## 2014-10-04 NOTE — Telephone Encounter (Signed)
Faxed hardcopy for Alprazolam to [place] [facility]

## 2014-10-04 NOTE — Telephone Encounter (Signed)
Done hardcopy to [facility]  

## 2014-12-24 ENCOUNTER — Inpatient Hospital Stay: Admission: RE | Admit: 2014-12-24 | Payer: BC Managed Care – PPO | Source: Ambulatory Visit

## 2014-12-26 ENCOUNTER — Ambulatory Visit (INDEPENDENT_AMBULATORY_CARE_PROVIDER_SITE_OTHER)
Admission: RE | Admit: 2014-12-26 | Discharge: 2014-12-26 | Disposition: A | Discharge status: Home / Self Care | Payer: BLUE CROSS/BLUE SHIELD | Source: Ambulatory Visit | Attending: Internal Medicine | Admitting: Internal Medicine

## 2014-12-26 DIAGNOSIS — M8580 Other specified disorders of bone density and structure, unspecified site: Secondary | ICD-10-CM

## 2015-01-01 ENCOUNTER — Encounter: Payer: Self-pay | Admitting: Internal Medicine

## 2015-01-01 DIAGNOSIS — M810 Age-related osteoporosis without current pathological fracture: Secondary | ICD-10-CM

## 2015-01-01 MED ORDER — ALENDRONATE SODIUM 70 MG PO TABS: 70.0000 mg | ORAL_TABLET | ORAL | Status: DC

## 2015-01-02 NOTE — Addendum Note (Signed)
Addended by: Biagio Borg on: 01/02/2015 06:52 PM   Modules accepted: Orders

## 2015-02-28 ENCOUNTER — Other Ambulatory Visit (INDEPENDENT_AMBULATORY_CARE_PROVIDER_SITE_OTHER): Payer: BLUE CROSS/BLUE SHIELD

## 2015-02-28 DIAGNOSIS — Z0000 Encounter for general adult medical examination without abnormal findings: Secondary | ICD-10-CM

## 2015-02-28 LAB — TSH: TSH: 2.85 u[IU]/mL (ref 0.35–4.50)

## 2015-03-01 ENCOUNTER — Ambulatory Visit (INDEPENDENT_AMBULATORY_CARE_PROVIDER_SITE_OTHER): Payer: 59 | Admitting: Internal Medicine

## 2015-03-01 ENCOUNTER — Encounter: Payer: Self-pay | Admitting: Internal Medicine

## 2015-03-01 VITALS — BP 118/74 | HR 69 | Temp 98.3°F | Resp 18 | Ht 67.0 in | Wt 148.1 lb

## 2015-03-01 DIAGNOSIS — F329 Major depressive disorder, single episode, unspecified: Secondary | ICD-10-CM

## 2015-03-01 DIAGNOSIS — M545 Low back pain, unspecified: Secondary | ICD-10-CM

## 2015-03-01 DIAGNOSIS — F411 Generalized anxiety disorder: Secondary | ICD-10-CM

## 2015-03-01 DIAGNOSIS — F32A Depression, unspecified: Secondary | ICD-10-CM

## 2015-03-01 MED ORDER — IBUPROFEN 600 MG PO TABS: 600.0000 mg | ORAL_TABLET | Freq: Four times a day (QID) | ORAL | Status: DC | PRN

## 2015-03-01 MED ORDER — TIZANIDINE HCL 4 MG PO TABS: 4.0000 mg | ORAL_TABLET | Freq: Four times a day (QID) | ORAL | Status: DC | PRN

## 2015-03-01 MED ORDER — PREDNISONE 10 MG PO TABS: ORAL_TABLET | ORAL | Status: DC

## 2015-03-01 NOTE — Assessment & Plan Note (Signed)
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

## 2015-03-01 NOTE — Progress Notes (Signed)
   Subjective:    Patient ID: Denise Park, female    DOB: [birth year], 59 y.o.   MRN: [id number]  HPI    Review of Systems     Objective:   Physical Exam        Assessment & Plan:

## 2015-03-01 NOTE — Progress Notes (Addendum)
Subjective:    Patient ID: Denise Park, female    DOB: 1956-03-26, 59 y.o.   MRN: [id number]  HPI  Here with c/o 3-4 days onset low back pain , worse on the left than right, moderate to severe, constant, with some radiation to between the shoulder blades bilat left > right, has been working at the post office one day per wk with plenty of recurring lifting and twisting at the waist, No fever, no falls. Denies urinary symptoms such as dysuria, frequency, urgency, flank pain, hematuria or n/v, fever, chills. Pt has no bowel or bladder change, fever, wt loss,  worsening LE pain/numbness/weakness, gait change or falls.  Has LS spine films 2010 with degenerative changes. No recent imaging, no prior surgury. naproxn alone not working  Denies worsening depressive symptoms, suicidal ideation, or panic; has ongoing anxiety,  Also mentions has already seen UC with neg urine tesitng Past Medical History  Diagnosis Date  . ALLERGIC RHINITIS 10/30/2007  . ANEMIA-IRON DEFICIENCY 10/30/2007  . ANXIETY 10/30/2007  . BACK PAIN 02/13/2008  . COMMON MIGRAINE 10/30/2007  . DEPRESSION 10/30/2007  . GERD 10/30/2007  . HYPERSOMNIA 09/11/2008  . INSOMNIA-SLEEP DISORDER-UNSPEC 09/11/2008  . IRRITABLE BOWEL SYNDROME, HX OF 10/30/2007  . OSTEOPENIA 02/13/2008  . PELVIC PAIN, CHRONIC 05/14/2009  . SINUSITIS- ACUTE-NOS 02/13/2008  . Osteopenia 05/22/2011  . Hyperlipidemia 09/26/2014   Past Surgical History  Procedure Laterality Date  . Cesarean section    . Tubal ligation      reports that she has never smoked. She does not have any smokeless tobacco history on file. She reports that she drinks alcohol. She reports that she does not use illicit drugs. family history includes Coronary artery disease in her other; Depression in her other. No Known Allergies Current Outpatient Prescriptions on File Prior to Visit  Medication Sig Dispense Refill  . alendronate (FOSAMAX) 70 MG tablet Take 1 tablet (70 mg total) by mouth every 7  (seven) days. Take with a full glass of water on an empty stomach. 12 tablet 3  . ALPRAZolam (XANAX) 0.5 MG tablet TAKE ONE TABLET BY MOUTH TWICE DAILY AS NEEDED  60 tablet 2  . CALCIUM-VITAMIN D PO Take by mouth daily.      . naproxen (NAPROSYN) 500 MG tablet Take 1 tablet (500 mg total) by mouth 2 (two) times daily with a meal. 60 tablet 11  . zolpidem (AMBIEN) 10 MG tablet TAKE ONE TABLET BY MOUTH AT BEDTIME AS NEEDED 90 tablet 1   No current facility-administered medications on file prior to visit.   Review of Systems  Constitutional: Negative for unusual diaphoresis or night sweats HENT: Negative for ringing in ear or discharge Eyes: Negative for double vision or worsening visual disturbance.  Respiratory: Negative for choking and stridor.   Gastrointestinal: Negative for vomiting or other signifcant bowel change Genitourinary: Negative for hematuria or change in urine volume.  Musculoskeletal: Negative for other MSK pain or swelling Skin: Negative for color change and worsening wound.  Neurological: Negative for tremors and numbness other than noted  Psychiatric/Behavioral: Negative for decreased concentration or agitation other than above       Objective:   Physical Exam BP 118/74 mmHg  Pulse 69  Temp(Src) 98.3 F (36.8 C) (Oral)  Resp 18  Ht 5\' 7"  (1.702 m)  Wt 148 lb 1.9 oz (67.187 kg)  BMI 23.19 kg/m2  SpO2 98% VS noted,  Constitutional: Pt appears in no significant distress HENT: Head: NCAT.  Right Ear:  External ear normal.  Left Ear: External ear normal.  Eyes: . Pupils are equal, round, and reactive to light. Conjunctivae and EOM are normal Neck: Normal range of motion. Neck supple.  Cardiovascular: Normal rate and regular rhythm.   Pulmonary/Chest: Effort normal and breath sounds without rales or wheezing.  Abd:  Soft, NT, ND, + BS Neurological: Pt is alert. Not confused , motor 5/5ntact, dtr/sens intact Spine nontender + bilat lumbar and thoracic left >  right paravertebral muscle spasm, no swelling, no erythema or rash Skin: Skin is warm. No rash, no LE edema Psychiatric: Pt behavior is normal. No agitation. 1-2+ nervous      Assessment & Plan:

## 2015-03-01 NOTE — Patient Instructions (Signed)
Please take all new medication as prescribed - the anti-inflammatory, prednisone and muscle relaxer as needed  Please continue all other medications as before, and refills have been done if requested.  Please have the pharmacy call with any other refills you may need.  Please keep your appointments with your specialists as you may have planned

## 2015-03-01 NOTE — Assessment & Plan Note (Signed)
Mild to mod today, exac by pain, o/w stable,  to f/u any worsening symptoms or concerns, cont same tx

## 2015-03-01 NOTE — Assessment & Plan Note (Signed)
stable overall by history and exam, , and pt to continue medical treatment as before,  to f/u any worsening symptoms or concerns  

## 2015-03-08 ENCOUNTER — Telehealth: Payer: Self-pay | Admitting: Internal Medicine

## 2015-03-08 MED ORDER — ZOLPIDEM TARTRATE 10 MG PO TABS: ORAL_TABLET | ORAL | Status: DC

## 2015-03-08 NOTE — Telephone Encounter (Signed)
Done hardcopy to Cherina  

## 2015-03-08 NOTE — Telephone Encounter (Signed)
Pt called in said that she need new script for zolpidem (AMBIEN) 10 MG tablet [[id number]].  She never refilled the last one and pharmacy said it was to old to fill.  Can she get another ?   Best number [phone]

## 2015-03-11 NOTE — Telephone Encounter (Signed)
[facility] fax script to [facility]...Denise Park

## 2015-03-28 ENCOUNTER — Telehealth: Payer: Self-pay | Admitting: Internal Medicine

## 2015-03-28 NOTE — Telephone Encounter (Signed)
Pt called in said that her back is still hurting and wanted to know if she given it enough time that meds should be working?  She requested to talk to the nurse    Best number [phone] Cell [phone]

## 2015-03-29 ENCOUNTER — Ambulatory Visit: Payer: Self-pay | Admitting: Internal Medicine

## 2015-03-29 ENCOUNTER — Telehealth: Payer: Self-pay | Admitting: Internal Medicine

## 2015-03-29 ENCOUNTER — Telehealth: Payer: Self-pay | Admitting: *Deleted

## 2015-03-29 MED ORDER — TIZANIDINE HCL 4 MG PO TABS: 4.0000 mg | ORAL_TABLET | Freq: Four times a day (QID) | ORAL | Status: DC | PRN

## 2015-03-29 NOTE — Telephone Encounter (Signed)
Pt called back inquiring about this. I did also transfer her to [facility]

## 2015-03-29 NOTE — Telephone Encounter (Signed)
[facility] - Client [facility] Patient Name: [facility] Yackel Gender: Female DOB: 1956/03/08 Age: 59 Y 71 M Return Phone Number: [phone] (Primary), [phone] (Secondary) Address[place] [address] Client [facility] - Client Client Site [facility] Day Physician Cathlean Cower Contact Type Call Call Type Triage / Clinical Relationship To Patient Self Appointment Disposition EMR Appointment Scheduled Info pasted into Epic Yes Return Phone Number [phone] (Primary) Chief Complaint Muscle Jerks, Tics And Shudders Initial Comment Caller states she was diagnosed with muscle spasms last month and prescribed rx, wants to know how long they will continue PreDisposition Did not know what to do Nurse Assessment Nurse: Mechele Dawley, RN, Amy Date/Time (Eastern Time): 03/29/2015 9:52:23 AM Confirm and document reason for call. If symptomatic, describe symptoms. ---CALLER STATES THAT SHE HAD MUSCLE SPASMS FROM LAST MONTH AND SHE IS NOT SURE AS TO HOW LONG THEY WILL LAST. SHE HAS BEEN TAKING THE MEDICATION. SHE HAS BEEN TAKING IT EVERY 6 HOURS. SHE IS STILL HAVING THE SPASMS WHERE IT IS HURTING. SHE WAS TAKING THE IBUPROFEN AS WELL. SHE IS NOT TAKING IT AS MUCH. MUSCLE SPASMS ARE IN THE LOWER BACK UP TO THE UPPER PART OF THE BACK AS WELL. SHE HAS CONTINUED TO DO HER NORMAL ACTIVITIES. Has the patient traveled out of the country within the last 30 days? ---Not Applicable Does the patient require triage? ---Yes Related visit to physician within the last 2 weeks? ---No Does the PT have any chronic conditions? (i.e. diabetes, asthma, etc.) ---Yes List chronic conditions. ---OSTEOPOROSIS, ARTHRITIS Guidelines Guideline Title Affirmed Question Affirmed Notes Nurse Date/Time (Eastern Time) Back Pain Numbness in a leg or foot (i.e., loss of sensation) Mechele Dawley, RN, Amy 03/29/2015 9:53:20  AM Disp. Time Eilene Ghazi Time) Disposition Final User 03/29/2015 9:58:04 AM See Physician within 24 Hours Yes [place], RN, Amy PLEASE NOTE: All timestamps contained within this report are represented as [place] Standard Time. CONFIDENTIALTY NOTICE: This fax transmission is intended only for the addressee. It contains information that is legally privileged, confidential or otherwise protected from use or disclosure. If you are not the intended recipient, you are strictly prohibited from reviewing, disclosing, copying using or disseminating any of this information or taking any action in reliance on or regarding this information. If you have received this fax in error, please notify us immediately by telephone so that we can arrange for its return to [place]. Phone: [phone], Toll-Free: [phone], Fax: [phone] Page: 2 of 2 Call Id: [id number] [address] Understands: Yes Disagree/Comply: Comply Care Advice Given Per Guideline

## 2015-03-29 NOTE — Telephone Encounter (Signed)
[facility] Patient Name: Denise Park DOB: 12-24-1955 Initial Comment Caller states she was diagnosed with muscle spasms last month and prescribed rx, wants to know how long they will continue Nurse Assessment Nurse: Mechele Dawley, RN, Amy Date/Time (Eastern Time): 03/29/2015 9:52:23 AM Confirm and document reason for call. If symptomatic, describe symptoms. ---CALLER STATES THAT SHE HAD MUSCLE SPASMS FROM LAST MONTH AND SHE IS NOT SURE AS TO HOW LONG THEY WILL LAST. SHE HAS BEEN TAKING THE MEDICATION. SHE HAS BEEN TAKING IT EVERY 6 HOURS. SHE IS STILL HAVING THE SPASMS WHERE IT IS HURTING. SHE WAS TAKING THE IBUPROFEN AS WELL. SHE IS NOT TAKING IT AS MUCH. MUSCLE SPASMS ARE IN THE LOWER BACK UP TO THE UPPER PART OF THE BACK AS WELL. SHE HAS CONTINUED TO DO HER NORMAL ACTIVITIES. Has the patient traveled out of the country within the last 30 days? ---Not Applicable Does the patient require triage? ---Yes Related visit to physician within the last 2 weeks? ---No Does the PT have any chronic conditions? (i.e. diabetes, asthma, etc.) ---Yes List chronic conditions. ---OSTEOPOROSIS, ARTHRITIS Guidelines Guideline Title Affirmed Question Affirmed Notes Back Pain Numbness in a leg or foot (i.e., loss of sensation) Final Disposition User See Physician within [place], [place], Amy Comments appointment scheduled for Tuesday 4/12 at 4:15 left this information on voice mail per her request. She would like for Dr. Jenny Reichmann to do this via My Chart if possible rather than coming in and her having to do a co-pay. Informed her would put this in the dccumentation. She states this is just a continuation from her spasms from before.

## 2015-03-29 NOTE — Telephone Encounter (Signed)
For refill zanaflex - done erx

## 2015-03-29 NOTE — Telephone Encounter (Signed)
Notified pt with md response.../lmb 

## 2015-04-02 ENCOUNTER — Encounter: Payer: Self-pay | Admitting: Internal Medicine

## 2015-04-02 ENCOUNTER — Ambulatory Visit (INDEPENDENT_AMBULATORY_CARE_PROVIDER_SITE_OTHER): Payer: 59 | Admitting: Internal Medicine

## 2015-04-02 VITALS — BP 118/80 | HR 75 | Temp 98.5°F | Resp 18 | Ht 67.0 in | Wt 148.0 lb

## 2015-04-02 DIAGNOSIS — M545 Low back pain, unspecified: Secondary | ICD-10-CM

## 2015-04-02 DIAGNOSIS — M8580 Other specified disorders of bone density and structure, unspecified site: Secondary | ICD-10-CM

## 2015-04-02 DIAGNOSIS — F329 Major depressive disorder, single episode, unspecified: Secondary | ICD-10-CM

## 2015-04-02 DIAGNOSIS — F32A Depression, unspecified: Secondary | ICD-10-CM

## 2015-04-02 MED ORDER — TRAMADOL HCL 50 MG PO TABS: 50.0000 mg | ORAL_TABLET | Freq: Four times a day (QID) | ORAL | Status: DC | PRN

## 2015-04-02 MED ORDER — CYCLOBENZAPRINE HCL 5 MG PO TABS
5.0000 mg | ORAL_TABLET | Freq: Three times a day (TID) | ORAL | Status: DC | PRN
Start: 2015-04-02 — End: 2016-03-17

## 2015-04-02 NOTE — Progress Notes (Signed)
Subjective:    Patient ID: Denise Park, female    DOB: 10-07-1956, 59 y.o.   MRN: [id number]  HPI  Here with > 4 wks onset gradually worsening now daily severe sharp and dull lower back pain, worse at times on the left then the right, no bowel or bladder change, fever, wt loss,  worsening LE pain/numbness/weakness, or falls.  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  No prior hx of same.  2010 plain films c/w lumbar deg changes.  No recent MRI, no trauma or fever.  Ibuprofen helps somewhat, but muscle relaxer caused sedation and loopiness, cannot take, prednisone no help overall. Can currently stand only < 1 hr before too much pain, has to sit. Denies worsening depressive symptoms, suicidal ideation, or panic Past Medical History  Diagnosis Date  . ALLERGIC RHINITIS 10/30/2007  . ANEMIA-IRON DEFICIENCY 10/30/2007  . ANXIETY 10/30/2007  . BACK PAIN 02/13/2008  . COMMON MIGRAINE 10/30/2007  . DEPRESSION 10/30/2007  . GERD 10/30/2007  . HYPERSOMNIA 09/11/2008  . INSOMNIA-SLEEP DISORDER-UNSPEC 09/11/2008  . IRRITABLE BOWEL SYNDROME, HX OF 10/30/2007  . OSTEOPENIA 02/13/2008  . PELVIC PAIN, CHRONIC 05/14/2009  . SINUSITIS- ACUTE-NOS 02/13/2008  . Osteopenia 05/22/2011  . Hyperlipidemia 09/26/2014   Past Surgical History  Procedure Laterality Date  . Cesarean section    . Tubal ligation      reports that she has never smoked. She does not have any smokeless tobacco history on file. She reports that she drinks alcohol. She reports that she does not use illicit drugs. family history includes Coronary artery disease in her other; Depression in her other. No Known Allergies Current Outpatient Prescriptions on File Prior to Visit  Medication Sig Dispense Refill  . alendronate (FOSAMAX) 70 MG tablet Take 1 tablet (70 mg total) by mouth every 7 (seven) days. Take with a full glass of water on an empty stomach. 12 tablet  3  . ALPRAZolam (XANAX) 0.5 MG tablet TAKE ONE TABLET BY MOUTH TWICE DAILY AS NEEDED  60 tablet 2  . CALCIUM-VITAMIN D PO Take by mouth daily.      Marland Kitchen ibuprofen (ADVIL,MOTRIN) 600 MG tablet Take 1 tablet (600 mg total) by mouth every 6 (six) hours as needed. 60 tablet 0  . naproxen (NAPROSYN) 500 MG tablet Take 1 tablet (500 mg total) by mouth 2 (two) times daily with a meal. 60 tablet 11  . zolpidem (AMBIEN) 10 MG tablet TAKE ONE TABLET BY MOUTH AT BEDTIME AS NEEDED 90 tablet 1   No current facility-administered medications on file prior to visit.   Review of Systems  Constitutional: Negative for unusual diaphoresis or night sweats HENT: Negative for ringing in ear or discharge Eyes: Negative for double vision or worsening visual disturbance.  Respiratory: Negative for choking and stridor.   Gastrointestinal: Negative for vomiting or other signifcant bowel change Genitourinary: Negative for hematuria or change in urine volume.  Musculoskeletal: Negative for other MSK pain or swelling Skin: Negative for color change and worsening wound.  Neurological: Negative for tremors and numbness other than noted  Psychiatric/Behavioral: Negative for decreased concentration or agitation other than above   ]    Objective:   Physical Exam BP 118/80 mmHg  Pulse 75  Temp(Src) 98.5 F (36.9 C) (Oral)  Resp 18  Ht 5\' 7"  (1.702 m)  Wt 148 lb (67.132 kg)  BMI 23.17 kg/m2  SpO2  97% VS noted,  Constitutional: Pt appears in no significant distress HENT: Head: NCAT.  Right Ear: External ear normal.  Left Ear: External ear normal.  Eyes: . Pupils are equal, round, and reactive to light. Conjunctivae and EOM are normal Neck: Normal range of motion. Neck supple.  Cardiovascular: Normal rate and regular rhythm.   Pulmonary/Chest: Effort normal and breath sounds without rales or wheezing.  Abd:  Soft, NT, ND, + BS Spine nontender; has persistent mild bilat lumbar paravertebral muscular  spasms Neurological: Pt is alert. Not confused , motor 5/5 intact, sens/dtr intact to LE's Skin: Skin is warm. No rash, no LE edema Psychiatric: Pt behavior is normal. No agitation.     Assessment & Plan:

## 2015-04-02 NOTE — Assessment & Plan Note (Addendum)
Exam c/w element of msk spasm, also with known lumbar deg changes, cant r/o spinal stenosis, for LS spine MRI, refer ortho, also for flexeril prn, tramadol prn,  to f/u any worsening symptoms or concerns

## 2015-04-02 NOTE — Assessment & Plan Note (Signed)
D/w pt at length., ok to cont fosamax as would not be causeing pain and not related to current lower back predicament

## 2015-04-02 NOTE — Assessment & Plan Note (Signed)
stable overall by history and exam, recent data reviewed with pt, and pt to continue medical treatment as before,  to f/u any worsening symptoms or concerns Lab Results  Component Value Date   WBC 5.6 09/19/2014   HGB 14.6 09/19/2014   HCT 43.4 09/19/2014   PLT 232.0 09/19/2014   GLUCOSE 76 09/19/2014   CHOL 207* 09/19/2014   TRIG 77.0 09/19/2014   HDL 59.60 09/19/2014   LDLCALC 132* 09/19/2014   ALT 15 09/19/2014   AST 22 09/19/2014   NA 141 09/19/2014   K 5.0 09/19/2014   CL 106 09/19/2014   CREATININE 0.9 09/19/2014   BUN 17 09/19/2014   CO2 26 09/19/2014   TSH 2.85 02/28/2015

## 2015-04-02 NOTE — Patient Instructions (Signed)
OK to stop the tizanidine muscle relaxer  Please take all new medication as prescribed - the flexeril muscle relaxer, and tramadol for pain  Please continue all other medications as before, and refills have been done if requested.  Please have the pharmacy call with any other refills you may need.  Please keep your appointments with your specialists as you may have planned  You will be contacted regarding the referral for: MRI , and orthopedic referral

## 2015-05-02 ENCOUNTER — Ambulatory Visit
Admission: RE | Admit: 2015-05-02 | Discharge: 2015-05-02 | Disposition: A | Discharge status: Home / Self Care | Payer: 59 | Source: Ambulatory Visit | Attending: Internal Medicine | Admitting: Internal Medicine

## 2015-05-02 DIAGNOSIS — M545 Low back pain, unspecified: Secondary | ICD-10-CM

## 2015-05-05 ENCOUNTER — Other Ambulatory Visit: Payer: Self-pay | Admitting: Internal Medicine

## 2015-05-05 DIAGNOSIS — M5416 Radiculopathy, lumbar region: Secondary | ICD-10-CM

## 2015-05-07 ENCOUNTER — Telehealth: Payer: Self-pay | Admitting: Internal Medicine

## 2015-05-07 NOTE — Telephone Encounter (Signed)
Pt advised of MRI results.

## 2015-05-07 NOTE — Telephone Encounter (Signed)
Patient called stating she received a voice mail yesterday regarding her MRI but the message was cut off. I didn't see any note of who called her. Did you call her on that?

## 2015-05-15 ENCOUNTER — Other Ambulatory Visit: Payer: Self-pay | Admitting: Internal Medicine

## 2015-05-22 ENCOUNTER — Encounter: Payer: Self-pay | Admitting: Internal Medicine

## 2015-07-10 ENCOUNTER — Other Ambulatory Visit: Payer: Self-pay | Admitting: Internal Medicine

## 2015-07-10 NOTE — Telephone Encounter (Signed)
Rx faxed to pharmacy  

## 2015-07-10 NOTE — Telephone Encounter (Signed)
Done hardcopy to Dahlia  

## 2015-09-03 ENCOUNTER — Ambulatory Visit (INDEPENDENT_AMBULATORY_CARE_PROVIDER_SITE_OTHER): Payer: 59 | Admitting: Internal Medicine

## 2015-09-03 ENCOUNTER — Encounter: Payer: Self-pay | Admitting: Internal Medicine

## 2015-09-03 VITALS — BP 120/82 | HR 75 | Temp 98.5°F | Resp 16 | Ht 67.0 in | Wt 156.0 lb

## 2015-09-03 DIAGNOSIS — J0101 Acute recurrent maxillary sinusitis: Secondary | ICD-10-CM | POA: Diagnosis not present

## 2015-09-03 MED ORDER — AMOXICILLIN-POT CLAVULANATE 875-125 MG PO TABS: 1.0000 | ORAL_TABLET | Freq: Two times a day (BID) | ORAL | Status: DC

## 2015-09-03 NOTE — Patient Instructions (Signed)

## 2015-09-03 NOTE — Progress Notes (Signed)
Pre visit review using our clinic review tool, if applicable. No additional management support is needed unless otherwise documented below in the visit note. 

## 2015-09-04 NOTE — Progress Notes (Signed)
Subjective:  Patient ID: Denise Park, female    DOB: [birth year]  Age: 59 y.o. MRN: [id number]  CC: Sinusitis   HPI Blannie Shedlock presents for a 3 week history of nasal mucus with postnasal drip, low-grade fever and chills, fatigue, and sinus congestion. She has been taking over-the-counter antihistamines and decongestants without much relief from her symptoms.  Outpatient Prescriptions Prior to Visit  Medication Sig Dispense Refill  . alendronate (FOSAMAX) 70 MG tablet Take 1 tablet (70 mg total) by mouth every 7 (seven) days. Take with a full glass of water on an empty stomach. 12 tablet 3  . ALPRAZolam (XANAX) 0.5 MG tablet TAKE ONE TABLET BY MOUTH TWICE DAILY AS NEEDED  60 tablet 2  . CALCIUM-VITAMIN D PO Take by mouth daily.      . cyclobenzaprine (FLEXERIL) 5 MG tablet Take 1 tablet (5 mg total) by mouth 3 (three) times daily as needed for muscle spasms. 60 tablet 1  . ibuprofen (ADVIL,MOTRIN) 600 MG tablet TAKE 1 TABLET BY MOUTH EVERY 6 HOURS AS NEEDED 60 tablet 0  . naproxen (NAPROSYN) 500 MG tablet Take 1 tablet (500 mg total) by mouth 2 (two) times daily with a meal. 60 tablet 11  . traMADol (ULTRAM) 50 MG tablet TAKE 1 TABLET BY MOUTH EVERY 6 HOURS AS NEEDED 60 tablet 0  . zolpidem (AMBIEN) 10 MG tablet TAKE ONE TABLET BY MOUTH AT BEDTIME AS NEEDED 90 tablet 1   No facility-administered medications prior to visit.    ROS Review of Systems  Constitutional: Positive for fever, chills and fatigue. Negative for diaphoresis, activity change and appetite change.  HENT: Positive for congestion, postnasal drip and rhinorrhea. Negative for facial swelling, nosebleeds, sinus pressure, sneezing, sore throat, tinnitus, trouble swallowing and voice change.   Eyes: Negative.   Respiratory: Negative.  Negative for cough, choking, chest tightness, shortness of breath and stridor.   Cardiovascular: Negative.  Negative for chest pain, palpitations and leg swelling.  Gastrointestinal:  Negative.  Negative for nausea, abdominal pain, diarrhea, constipation and blood in stool.  Endocrine: Negative.   Genitourinary: Negative.   Musculoskeletal: Negative.   Skin: Negative.   Allergic/Immunologic: Negative.   Neurological: Negative.  Negative for dizziness, tremors, weakness, light-headedness, numbness and headaches.  Hematological: Negative.  Negative for adenopathy. Does not bruise/bleed easily.  Psychiatric/Behavioral: Negative.     Objective:  BP 120/82 mmHg  Pulse 75  Temp(Src) 98.5 F (36.9 C) (Oral)  Ht 5\' 7"  (1.702 m)  Wt 156 lb (70.761 kg)  BMI 24.43 kg/m2  SpO2 98%  BP Readings from Last 3 Encounters:  09/03/15 120/82  04/02/15 118/80  03/01/15 118/74    Wt Readings from Last 3 Encounters:  09/03/15 156 lb (70.761 kg)  04/02/15 148 lb (67.132 kg)  03/01/15 148 lb 1.9 oz (67.187 kg)    Physical Exam  Constitutional: She is oriented to person, place, and time. No distress.  HENT:  Head: Normocephalic and atraumatic.  Right Ear: Hearing, tympanic membrane, external ear and ear canal normal.  Left Ear: Hearing, external ear and ear canal normal.  Nose: Rhinorrhea present. No mucosal edema or sinus tenderness. Right sinus exhibits maxillary sinus tenderness. Right sinus exhibits no frontal sinus tenderness. Left sinus exhibits no maxillary sinus tenderness and no frontal sinus tenderness.  Mouth/Throat: Oropharynx is clear and moist and mucous membranes are normal. Mucous membranes are not pale, not dry and not cyanotic. No oral lesions. No trismus in the jaw. No uvula swelling. No oropharyngeal  exudate, posterior oropharyngeal edema, posterior oropharyngeal erythema or tonsillar abscesses.  Eyes: Conjunctivae are normal. Right eye exhibits no discharge. Left eye exhibits no discharge. No scleral icterus.  Neck: Normal range of motion. Neck supple. No JVD present. No tracheal deviation present. No thyromegaly present.  Cardiovascular: Normal rate, regular  rhythm, normal heart sounds and intact distal pulses.  Exam reveals no gallop and no friction rub.   No murmur heard. Pulmonary/Chest: Effort normal and breath sounds normal. No stridor. No respiratory distress. She has no wheezes. She has no rales. She exhibits no tenderness.  Abdominal: Soft. Bowel sounds are normal. She exhibits no distension and no mass. There is no tenderness. There is no rebound and no guarding.  Musculoskeletal: Normal range of motion. She exhibits no edema.  Lymphadenopathy:    She has no cervical adenopathy.  Neurological: She is oriented to person, place, and time.  Skin: Skin is warm and dry. No rash noted. She is not diaphoretic. No erythema. No pallor.  Vitals reviewed.   Lab Results  Component Value Date   WBC 5.6 09/19/2014   HGB 14.6 09/19/2014   HCT 43.4 09/19/2014   PLT 232.0 09/19/2014   GLUCOSE 76 09/19/2014   CHOL 207* 09/19/2014   TRIG 77.0 09/19/2014   HDL 59.60 09/19/2014   LDLCALC 132* 09/19/2014   ALT 15 09/19/2014   AST 22 09/19/2014   NA 141 09/19/2014   K 5.0 09/19/2014   CL 106 09/19/2014   CREATININE 0.9 09/19/2014   BUN 17 09/19/2014   CO2 26 09/19/2014   TSH 2.85 02/28/2015    Mr Lumbar Spine Wo Contrast  05/03/2015   CLINICAL DATA:  Bilateral low back pain for 1 month with burning. No sciatica.  EXAM: MRI LUMBAR SPINE WITHOUT CONTRAST  TECHNIQUE: Multiplanar, multisequence MR imaging of the lumbar spine was performed. No intravenous contrast was administered.  COMPARISON:  None.  FINDINGS: No marrow signal abnormality suggestive of fracture, infection, or neoplasm. Normal conus signal and morphology. No perispinal abnormality to explain back pain. 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  Degenerative changes:  T12- L1: Unremarkable.  L1-L2: Unremarkable.  L2-L3: Unremarkable.  L3-L4: Moderate disc narrowing with left foraminal spur and disc which moderately narrows the left foramen. Mild posterior ligamentous overgrowth.  L4-L5:  Moderate to advanced disc narrowing with right far-lateral spur that contacts the far lateral L4 nerve root. Mild ligamentous overgrowth posteriorly. Patent canal  L5-S1:Dysmorphic appearance of the posterior elements with incomplete posterior bony arch and left lamina defect. Likely reactive right asymmetric facet arthropathy. There is slight anterolisthesis. No impingement.  IMPRESSION: 1. Moderate degenerative disc disease at L3-4 and below. No disc herniation or advanced stenosis. 2. L3-4 moderate left foraminal stenosis from disc narrowing and endplate spurs.   Electronically Signed   By: Monte Fantasia M.D.   On: 05/03/2015 08:09    Assessment & Plan:   Sharilynn was seen today for sinusitis.  Diagnoses and all orders for this visit:  Acute recurrent maxillary sinusitis- she will continue taking over-the-counter decongestants. I will treat the infection with Augmentin. -     amoxicillin-clavulanate (AUGMENTIN) 875-125 MG per tablet; Take 1 tablet by mouth 2 (two) times daily.   I am having Ms. Clair start on amoxicillin-clavulanate. I am also having her maintain her CALCIUM-VITAMIN D PO, naproxen, ALPRAZolam, alendronate, zolpidem, cyclobenzaprine, ibuprofen, traMADol, ESTRACE VAGINAL, and ALPRAZolam.  Meds ordered this encounter  Medications  . ESTRACE VAGINAL 0.1 MG/GM vaginal cream    Sig: INSERT  1 GRAM VAGINALLY TWICE A WEEK    Refill:  12  . ALPRAZolam (XANAX) 0.25 MG tablet    Sig: TK 1 T PO  BID PRF ANXIETY    Refill:  0  . amoxicillin-clavulanate (AUGMENTIN) 875-125 MG per tablet    Sig: Take 1 tablet by mouth 2 (two) times daily.    Dispense:  20 tablet    Refill:  1     Follow-up: Return in about 3 weeks (around 09/24/2015).  Scarlette Calico, MD

## 2015-10-03 ENCOUNTER — Telehealth: Payer: Self-pay | Admitting: *Deleted

## 2015-10-03 MED ORDER — ZOLPIDEM TARTRATE 10 MG PO TABS: ORAL_TABLET | ORAL | Status: DC

## 2015-10-03 NOTE — Telephone Encounter (Signed)
Done hardcopy to Dahlia  

## 2015-10-03 NOTE — Telephone Encounter (Signed)
Left msg on triage requesting refill on her zolpidem...Denise Park

## 2015-10-03 NOTE — Telephone Encounter (Signed)
Faxed script to [facility].../lmb 

## 2015-10-16 ENCOUNTER — Telehealth: Payer: Self-pay | Admitting: *Deleted

## 2015-10-16 MED ORDER — CETIRIZINE HCL 10 MG PO TABS: 10.0000 mg | ORAL_TABLET | Freq: Every day | ORAL | Status: DC

## 2015-10-16 NOTE — Telephone Encounter (Signed)
Left msg on triage requesting refill on her cetrizine. Notiifed pt rx sent to [facility]....Johny Chess

## 2015-12-10 ENCOUNTER — Other Ambulatory Visit: Payer: Self-pay | Admitting: Internal Medicine

## 2015-12-11 NOTE — Telephone Encounter (Signed)
Rx faxed to pharmacy  

## 2015-12-11 NOTE — Telephone Encounter (Signed)
Done hardcopy to Dahlia  

## 2015-12-25 ENCOUNTER — Encounter: Payer: Self-pay | Admitting: Internal Medicine

## 2015-12-25 ENCOUNTER — Other Ambulatory Visit: Payer: Self-pay | Admitting: Internal Medicine

## 2015-12-25 DIAGNOSIS — M545 Low back pain: Secondary | ICD-10-CM

## 2015-12-25 MED ORDER — IBUPROFEN 600 MG PO TABS: 600.0000 mg | ORAL_TABLET | Freq: Four times a day (QID) | ORAL | Status: DC | PRN

## 2015-12-25 MED ORDER — ZOLPIDEM TARTRATE 10 MG PO TABS: ORAL_TABLET | ORAL | Status: DC

## 2015-12-25 NOTE — Telephone Encounter (Signed)
Done hardcopy to [place] - the [facility]

## 2015-12-25 NOTE — Telephone Encounter (Signed)
Rx faxed to pharmacy  

## 2015-12-25 NOTE — Addendum Note (Signed)
Addended by: Biagio Borg on: 12/25/2015 12:52 PM   Modules accepted: Orders

## 2015-12-26 ENCOUNTER — Other Ambulatory Visit (INDEPENDENT_AMBULATORY_CARE_PROVIDER_SITE_OTHER): Payer: Self-pay

## 2015-12-26 DIAGNOSIS — M545 Low back pain: Secondary | ICD-10-CM

## 2015-12-26 LAB — CBC WITH DIFFERENTIAL/PLATELET
BASOS ABS: 0 10*3/uL (ref 0.0–0.1)
Basophils Relative: 0.6 % (ref 0.0–3.0)
EOS ABS: 0.1 10*3/uL (ref 0.0–0.7)
Eosinophils Relative: 1.9 % (ref 0.0–5.0)
HEMATOCRIT: 46.7 % — AB (ref 36.0–46.0)
HEMOGLOBIN: 15.5 g/dL — AB (ref 12.0–15.0)
Lymphocytes Relative: 23.9 % (ref 12.0–46.0)
Lymphs Abs: 1.7 10*3/uL (ref 0.7–4.0)
MCHC: 33.2 g/dL (ref 30.0–36.0)
MCV: 87.6 fl (ref 78.0–100.0)
Monocytes Absolute: 0.7 10*3/uL (ref 0.1–1.0)
Monocytes Relative: 9 % (ref 3.0–12.0)
Neutro Abs: 4.7 10*3/uL (ref 1.4–7.7)
Neutrophils Relative %: 64.6 % (ref 43.0–77.0)
PLATELETS: 280 10*3/uL (ref 150.0–400.0)
RBC: 5.33 Mil/uL — AB (ref 3.87–5.11)
RDW: 13.4 % (ref 11.5–15.5)
WBC: 7.3 10*3/uL (ref 4.0–10.5)

## 2015-12-26 LAB — SEDIMENTATION RATE: Sed Rate: 8 mm/hr (ref 0–22)

## 2015-12-26 LAB — C-REACTIVE PROTEIN: CRP: 0.4 mg/dL — AB (ref 0.5–20.0)

## 2016-02-04 ENCOUNTER — Other Ambulatory Visit: Payer: Self-pay | Admitting: Internal Medicine

## 2016-02-04 MED ORDER — ALENDRONATE SODIUM 70 MG PO TABS: 70.0000 mg | ORAL_TABLET | ORAL | Status: DC

## 2016-02-04 NOTE — Telephone Encounter (Signed)
Pt informed

## 2016-02-04 NOTE — Telephone Encounter (Signed)
Pt is due for a CPE.  Sending to PCP for advisement on the refill.

## 2016-02-04 NOTE — Telephone Encounter (Signed)
Done erx 

## 2016-02-04 NOTE — Telephone Encounter (Signed)
Pt requesting refill for alendronate (FOSAMAX) 70 MG tablet [id number] She says the pharmacy has been trying for a week  [facility] [address] in [place]

## 2016-03-17 ENCOUNTER — Other Ambulatory Visit: Payer: Self-pay | Admitting: Internal Medicine

## 2016-03-21 ENCOUNTER — Other Ambulatory Visit: Payer: Self-pay | Admitting: Internal Medicine

## 2016-03-24 NOTE — Telephone Encounter (Signed)
Left msg on triage stating pharmacy has sent refill for her fluoxetine. She started back stating med in Jan currently out of med...Denise Park

## 2016-04-28 ENCOUNTER — Other Ambulatory Visit: Payer: Self-pay | Admitting: Internal Medicine

## 2016-04-28 NOTE — Telephone Encounter (Signed)
Medication faxed to pharmacy 

## 2016-04-28 NOTE — Telephone Encounter (Signed)
Done hardcopy to [facility] to call pt   Due for ROV soon please

## 2016-04-28 NOTE — Telephone Encounter (Signed)
Please advise 

## 2016-05-12 ENCOUNTER — Ambulatory Visit: Payer: Self-pay | Admitting: Internal Medicine

## 2016-06-26 ENCOUNTER — Other Ambulatory Visit: Payer: Self-pay | Admitting: Internal Medicine

## 2016-06-26 NOTE — Telephone Encounter (Signed)
Please advise 

## 2016-06-26 NOTE — Telephone Encounter (Signed)
Done hardcopy to [place]  

## 2016-06-26 NOTE — Telephone Encounter (Signed)
Medication faxed to pharmacy 

## 2016-08-11 ENCOUNTER — Telehealth: Payer: Self-pay | Admitting: Internal Medicine

## 2016-08-11 NOTE — Telephone Encounter (Signed)
Error

## 2016-08-12 MED ORDER — BUSPIRONE HCL 10 MG PO TABS: 10.0000 mg | ORAL_TABLET | Freq: Three times a day (TID) | ORAL | 0 refills | Status: DC | PRN

## 2016-08-12 NOTE — Telephone Encounter (Signed)
Pt left on triage stating she has an appt w/ Dr. Jenny Reichmann on Sept 12th. She had started back taking the Fluoxetine, and needing to take the Bupropion. Requesting MD to call in rx to [facility] since hers has expired...Denise Park

## 2016-08-12 NOTE — Telephone Encounter (Signed)
rx has been done erx

## 2016-08-12 NOTE — Telephone Encounter (Signed)
Called pt no answer LMOM MD sent rx to [facility]../;lmb 

## 2016-09-01 ENCOUNTER — Other Ambulatory Visit (INDEPENDENT_AMBULATORY_CARE_PROVIDER_SITE_OTHER): Payer: BLUE CROSS/BLUE SHIELD

## 2016-09-01 ENCOUNTER — Ambulatory Visit (INDEPENDENT_AMBULATORY_CARE_PROVIDER_SITE_OTHER): Payer: BLUE CROSS/BLUE SHIELD | Admitting: Internal Medicine

## 2016-09-01 ENCOUNTER — Encounter: Payer: Self-pay | Admitting: Internal Medicine

## 2016-09-01 VITALS — BP 128/78 | HR 87 | Temp 98.0°F | Resp 20 | Wt 172.0 lb

## 2016-09-01 DIAGNOSIS — R6889 Other general symptoms and signs: Secondary | ICD-10-CM

## 2016-09-01 DIAGNOSIS — Z1159 Encounter for screening for other viral diseases: Secondary | ICD-10-CM

## 2016-09-01 DIAGNOSIS — F411 Generalized anxiety disorder: Secondary | ICD-10-CM

## 2016-09-01 DIAGNOSIS — M545 Low back pain, unspecified: Secondary | ICD-10-CM

## 2016-09-01 DIAGNOSIS — F329 Major depressive disorder, single episode, unspecified: Secondary | ICD-10-CM | POA: Diagnosis not present

## 2016-09-01 DIAGNOSIS — F32A Depression, unspecified: Secondary | ICD-10-CM

## 2016-09-01 DIAGNOSIS — Z0001 Encounter for general adult medical examination with abnormal findings: Secondary | ICD-10-CM

## 2016-09-01 DIAGNOSIS — M152 Bouchard's nodes (with arthropathy): Secondary | ICD-10-CM | POA: Diagnosis not present

## 2016-09-01 LAB — HEPATIC FUNCTION PANEL
ALBUMIN: 4.4 g/dL (ref 3.5–5.2)
ALT: 14 U/L (ref 0–35)
AST: 20 U/L (ref 0–37)
Alkaline Phosphatase: 81 U/L (ref 39–117)
BILIRUBIN TOTAL: 0.4 mg/dL (ref 0.2–1.2)
Bilirubin, Direct: 0.1 mg/dL (ref 0.0–0.3)
Total Protein: 7.4 g/dL (ref 6.0–8.3)

## 2016-09-01 LAB — CBC WITH DIFFERENTIAL/PLATELET
BASOS PCT: 0.5 % (ref 0.0–3.0)
Basophils Absolute: 0 10*3/uL (ref 0.0–0.1)
EOS ABS: 0.2 10*3/uL (ref 0.0–0.7)
EOS PCT: 3.3 % (ref 0.0–5.0)
HEMATOCRIT: 42.2 % (ref 36.0–46.0)
HEMOGLOBIN: 14.5 g/dL (ref 12.0–15.0)
LYMPHS PCT: 32.7 % (ref 12.0–46.0)
Lymphs Abs: 2.3 10*3/uL (ref 0.7–4.0)
MCHC: 34.4 g/dL (ref 30.0–36.0)
MCV: 85.7 fl (ref 78.0–100.0)
Monocytes Absolute: 0.7 10*3/uL (ref 0.1–1.0)
Monocytes Relative: 9.6 % (ref 3.0–12.0)
Neutro Abs: 3.9 10*3/uL (ref 1.4–7.7)
Neutrophils Relative %: 53.9 % (ref 43.0–77.0)
Platelets: 254 10*3/uL (ref 150.0–400.0)
RBC: 4.93 Mil/uL (ref 3.87–5.11)
RDW: 13.3 % (ref 11.5–15.5)
WBC: 7.2 10*3/uL (ref 4.0–10.5)

## 2016-09-01 LAB — LIPID PANEL
CHOL/HDL RATIO: 4
Cholesterol: 223 mg/dL — ABNORMAL HIGH (ref 0–200)
HDL: 60 mg/dL (ref >39.00)
LDL Cholesterol: 125 mg/dL — ABNORMAL HIGH (ref 0–99)
NonHDL: 162.56
TRIGLYCERIDES: 189 mg/dL — AB (ref 0.0–149.0)
VLDL: 37.8 mg/dL (ref 0.0–40.0)

## 2016-09-01 LAB — BASIC METABOLIC PANEL
BUN: 14 mg/dL (ref 6–23)
CHLORIDE: 104 meq/L (ref 96–112)
CO2: 32 meq/L (ref 19–32)
Calcium: 9.4 mg/dL (ref 8.4–10.5)
Creatinine, Ser: 0.84 mg/dL (ref 0.40–1.20)
GFR: 73.99 mL/min (ref >60.00)
Glucose, Bld: 68 mg/dL — ABNORMAL LOW (ref 70–99)
POTASSIUM: 4 meq/L (ref 3.5–5.1)
Sodium: 139 mEq/L (ref 135–145)

## 2016-09-01 LAB — URINALYSIS, ROUTINE W REFLEX MICROSCOPIC
BILIRUBIN URINE: NEGATIVE
HGB URINE DIPSTICK: NEGATIVE
KETONES UR: NEGATIVE
LEUKOCYTES UA: NEGATIVE
NITRITE: NEGATIVE
RBC / HPF: NONE SEEN (ref 0–?)
SPECIFIC GRAVITY, URINE: 1.02 (ref 1.000–1.030)
Total Protein, Urine: NEGATIVE
URINE GLUCOSE: NEGATIVE
Urobilinogen, UA: 0.2 (ref 0.0–1.0)
WBC UA: NONE SEEN (ref 0–?)
pH: 6 (ref 5.0–8.0)

## 2016-09-01 LAB — TSH: TSH: 2.95 u[IU]/mL (ref 0.35–4.50)

## 2016-09-01 MED ORDER — CYCLOBENZAPRINE HCL 5 MG PO TABS: ORAL_TABLET | ORAL | 5 refills | Status: DC

## 2016-09-01 MED ORDER — DICLOFENAC SODIUM 1 % TD GEL: 4.0000 g | Freq: Four times a day (QID) | TRANSDERMAL | 11 refills | Status: DC | PRN

## 2016-09-01 MED ORDER — TRAMADOL HCL 50 MG PO TABS: 50.0000 mg | ORAL_TABLET | Freq: Four times a day (QID) | ORAL | 5 refills | Status: DC | PRN

## 2016-09-01 MED ORDER — BUPROPION HCL ER (SR) 100 MG PO TB12: 100.0000 mg | ORAL_TABLET | Freq: Two times a day (BID) | ORAL | 3 refills | Status: DC

## 2016-09-01 NOTE — Progress Notes (Signed)
Pre visit review using our clinic review tool, if applicable. No additional management support is needed unless otherwise documented below in the visit note. 

## 2016-09-01 NOTE — Progress Notes (Signed)
Subjective:    Patient ID: Denise Park, female    DOB: 09-Aug-1956, 60 y.o.   MRN: [id number]  HPI Here for wellness and f/u;  Overall doing ok;  Pt denies Chest pain, worsening SOB, DOE, wheezing, orthopnea, PND, worsening LE edema, palpitations, dizziness or syncope.  Pt denies neurological change such as new headache, facial or extremity weakness.  Pt denies polydipsia, polyuria, or low sugar symptoms. Pt states overall good compliance with treatment and medications, good tolerability, and has been trying to follow appropriate diet.  Pt denies worsening depressive symptoms, suicidal ideation or panic. No fever, night sweats, wt loss, loss of appetite, or other constitutional symptoms.  Pt states good ability with ADL's, has low fall risk, home safety reviewed and adequate, no other significant changes in hearing or vision, and only occasionally active with exercise. Declines flu shot.   Gained several lbs.  Has been more depressed/anxious recently with persistent chronic LBP, and has gained wt, not going to the gym. Pt continues to have recurring LBP without change in severity, bowel or bladder change, fever, wt loss,  worsening LE pain/numbness/weakness, gait change or falls. Wt Readings from Last 3 Encounters:  09/01/16 172 lb (78 kg)  09/03/15 156 lb (70.8 kg)  04/02/15 148 lb (67.1 kg)   asks for tramadol refills.  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 S/p ESI per Dr Nelva Bush in Dec 2016, helped at the time.    Denies worsening depressive symptoms, suicidal ideation, or panic; has ongoing anxiety, not increased recently.  Did try to come off the prozac last summer but did not work well, so restarted and doing well  Also with bilat hand djd pain, worse with stiffness especially in the am, no swelling or fever. Past Medical History:  Diagnosis Date  . ALLERGIC RHINITIS 10/30/2007  . ANEMIA-IRON DEFICIENCY 10/30/2007    . ANXIETY 10/30/2007  . BACK PAIN 02/13/2008  . COMMON MIGRAINE 10/30/2007  . DEPRESSION 10/30/2007  . GERD 10/30/2007  . Hyperlipidemia 09/26/2014  . HYPERSOMNIA 09/11/2008  . INSOMNIA-SLEEP DISORDER-UNSPEC 09/11/2008  . IRRITABLE BOWEL SYNDROME, HX OF 10/30/2007  . OSTEOPENIA 02/13/2008  . Osteopenia 05/22/2011  . PELVIC PAIN, CHRONIC 05/14/2009  . SINUSITIS- ACUTE-NOS 02/13/2008   Past Surgical History:  Procedure Laterality Date  . CESAREAN SECTION    . TUBAL LIGATION      reports that she has never smoked. She does not have any smokeless tobacco history on file. She reports that she drinks alcohol. She reports that she does not use drugs. family history includes Coronary artery disease in her other; Depression in her other. No Known Allergies Current Outpatient Prescriptions on File Prior to Visit  Medication Sig Dispense Refill  . alendronate (FOSAMAX) 70 MG tablet Take 1 tablet (70 mg total) by mouth every 7 (seven) days. Take with a full glass of water on an empty stomach. 12 tablet 3  . busPIRone (BUSPAR) 10 MG tablet Take 1 tablet (10 mg total) by mouth 3 (three) times daily as needed. 90 tablet 0  . CALCIUM-VITAMIN D PO Take by mouth daily.      . cetirizine (ZYRTEC) 10 MG tablet Take 1 tablet (10 mg total) by mouth daily. 90 tablet 1  . ESTRACE VAGINAL 0.1 MG/GM vaginal cream INSERT 1 GRAM VAGINALLY TWICE A WEEK  12  . FLUoxetine (PROZAC) 20 MG capsule Take 1 capsule (20 mg total) by mouth daily.  Yearly physical due in Sept must see md for refills 90 capsule 1  . ibuprofen (ADVIL,MOTRIN) 600 MG tablet Take 1 tablet (600 mg total) by mouth every 6 (six) hours as needed. 60 tablet 2  . naproxen (NAPROSYN) 500 MG tablet Take 1 tablet (500 mg total) by mouth 2 (two) times daily with a meal. 60 tablet 11  . zolpidem (AMBIEN) 10 MG tablet TAKE 1 TABLET BY MOUTH EVERY NIGHT AT BEDTIME AS NEEDED 90 tablet 0   No current facility-administered medications on file prior to visit.    Review  of Systems  Constitutional: Negative for increased diaphoresis, or other activity, appetite or siginficant weight change other than noted HENT: Negative for worsening hearing loss, ear pain, facial swelling, mouth sores and neck stiffness.   Eyes: Negative for other worsening pain, redness or visual disturbance.  Respiratory: Negative for choking or stridor Cardiovascular: Negative for other chest pain and palpitations.  Gastrointestinal: Negative for worsening diarrhea, blood in stool, or abdominal distention Genitourinary: Negative for hematuria, flank pain or change in urine volume.  Musculoskeletal: Negative for myalgias or other joint complaints.  Skin: Negative for other color change and wound or drainage.  Neurological: Negative for syncope and numbness. other than noted Hematological: Negative for adenopathy. or other swelling Psychiatric/Behavioral: Negative for hallucinations, SI, self-injury, decreased concentration or other worsening agitation.      Objective:   Physical Exam BP 128/78   Pulse 87   Temp 98 F (36.7 C) (Oral)   Resp 20   Wt 172 lb (78 kg)   SpO2 97%   BMI 26.94 kg/m  VS noted,  Constitutional: Pt is oriented to person, place, and time. Appears well-developed and well-nourished, in no significant distress Head: Normocephalic and atraumatic  Eyes: Conjunctivae and EOM are normal. Pupils are equal, round, and reactive to light Right Ear: External ear normal.  Left Ear: External ear normal Nose: Nose normal.  Mouth/Throat: Oropharynx is clear and moist  Neck: Normal range of motion. Neck supple. No JVD present. No tracheal deviation present or significant neck LA or mass Cardiovascular: Normal rate, regular rhythm, normal heart sounds and intact distal pulses.   Pulmonary/Chest: Effort normal and breath sounds without rales or wheezing  Abdominal: Soft. Bowel sounds are normal. NT. No HSM  Musculoskeletal: Normal range of motion. Exhibits no  edema Lymphadenopathy: Has no cervical adenopathy.  Neurological: Pt is alert and oriented to person, place, and time. Pt has normal reflexes. No cranial nerve deficit. Motor grossly intact Skin: Skin is warm and dry. No rash noted or new ulcers Psychiatric:  Has mild nervous mood and affect. Behavior is normal. Not depressed affect Spine nontender to midline Hands with mult deg changes/nodes to most PIP and DIPs    Assessment & Plan:

## 2016-09-01 NOTE — Patient Instructions (Addendum)
Please take all new medication as prescribed - the voltaren gel, and the wellbutrin XR for depression  OK to take the buspirone with all of your medications  Please continue all other medications as before, and refills have been done if requested - the tramadol and muscle relaxer  Please have the pharmacy call with any other refills you may need.  Please continue your efforts at being more active, low cholesterol diet, and weight control.  You are otherwise up to date with prevention measures today.  Please keep your appointments with your specialists as you may have planned  Please go to the LAB in the Basement (turn left off the elevator) for the tests to be done today  You will be contacted by phone if any changes need to be made immediately.  Otherwise, you will receive a letter about your results with an explanation, but please check with MyChart first.  Please remember to sign up for MyChart if you have not done so, as this will be important to you in the future with finding out test results, communicating by private email, and scheduling acute appointments online when needed.  Please return in 1 year for your yearly visit, or sooner if needed, with Lab testing done 3-5 days before

## 2016-09-02 LAB — HEPATITIS C ANTIBODY: HCV Ab: NEGATIVE

## 2016-09-06 NOTE — Assessment & Plan Note (Signed)
Stable, to cont same tx 

## 2016-09-06 NOTE — Assessment & Plan Note (Signed)
Stable. To cont same tx

## 2016-09-06 NOTE — Assessment & Plan Note (Signed)

## 2016-09-06 NOTE — Assessment & Plan Note (Addendum)
With mild to mod pain, for volt gel prn,  to f/u any worsening symptoms or concerns  In addition to the time spent performing CPE, I spent an additional 25 minutes face to face,in which greater than 50% of this time was spent in counseling and coordination of care for patient's acute illness as documented.

## 2016-09-06 NOTE — Assessment & Plan Note (Signed)
Stable chronic, for muscle relaxer, tramadol prn,  to f/u any worsening symptoms or concerns

## 2016-09-29 ENCOUNTER — Other Ambulatory Visit: Payer: Self-pay | Admitting: Internal Medicine

## 2016-10-21 ENCOUNTER — Other Ambulatory Visit: Payer: Self-pay | Admitting: Internal Medicine

## 2016-11-15 ENCOUNTER — Other Ambulatory Visit: Payer: Self-pay | Admitting: Internal Medicine

## 2017-01-21 ENCOUNTER — Other Ambulatory Visit: Payer: Self-pay | Admitting: Internal Medicine

## 2017-01-22 MED ORDER — ZOLPIDEM TARTRATE 10 MG PO TABS: 10.0000 mg | ORAL_TABLET | Freq: Every evening | ORAL | 1 refills | Status: DC | PRN

## 2017-01-22 NOTE — Telephone Encounter (Signed)
faxed

## 2017-01-22 NOTE — Telephone Encounter (Signed)
Done hardcopy to [place]  

## 2017-05-09 ENCOUNTER — Other Ambulatory Visit: Payer: Self-pay | Admitting: Internal Medicine

## 2017-05-11 NOTE — Telephone Encounter (Signed)
Done hardcopy to [place]  

## 2017-05-11 NOTE — Telephone Encounter (Signed)
Script faxed back to [facility]...Denise Park

## 2017-06-03 ENCOUNTER — Other Ambulatory Visit: Payer: Self-pay | Admitting: Internal Medicine

## 2017-06-25 ENCOUNTER — Other Ambulatory Visit: Payer: Self-pay | Admitting: Internal Medicine

## 2017-08-25 ENCOUNTER — Other Ambulatory Visit: Payer: Self-pay | Admitting: Internal Medicine

## 2017-08-26 NOTE — Telephone Encounter (Signed)
Faxed

## 2017-08-26 NOTE — Telephone Encounter (Signed)
Done hardcopy to [place]  

## 2017-09-13 ENCOUNTER — Other Ambulatory Visit: Payer: Self-pay | Admitting: Internal Medicine

## 2017-09-14 NOTE — Telephone Encounter (Signed)
prozac done erx  Please let pt know, she is due for yearly f/u

## 2017-09-14 NOTE — Telephone Encounter (Signed)
Please schedule pt for a CPE Thanks!

## 2017-09-14 NOTE — Telephone Encounter (Signed)
LM with pt to schedule apt.

## 2017-10-06 ENCOUNTER — Encounter: Payer: Self-pay | Admitting: Internal Medicine

## 2017-10-06 ENCOUNTER — Telehealth: Payer: Self-pay

## 2017-10-06 ENCOUNTER — Ambulatory Visit (INDEPENDENT_AMBULATORY_CARE_PROVIDER_SITE_OTHER): Payer: BLUE CROSS/BLUE SHIELD | Admitting: Internal Medicine

## 2017-10-06 ENCOUNTER — Other Ambulatory Visit (INDEPENDENT_AMBULATORY_CARE_PROVIDER_SITE_OTHER): Payer: BLUE CROSS/BLUE SHIELD

## 2017-10-06 VITALS — BP 124/86 | HR 84 | Temp 98.4°F | Ht 67.0 in | Wt 165.0 lb

## 2017-10-06 DIAGNOSIS — Z114 Encounter for screening for human immunodeficiency virus [HIV]: Secondary | ICD-10-CM | POA: Diagnosis not present

## 2017-10-06 DIAGNOSIS — E785 Hyperlipidemia, unspecified: Secondary | ICD-10-CM | POA: Diagnosis not present

## 2017-10-06 DIAGNOSIS — Z23 Encounter for immunization: Secondary | ICD-10-CM | POA: Diagnosis not present

## 2017-10-06 DIAGNOSIS — Z0000 Encounter for general adult medical examination without abnormal findings: Secondary | ICD-10-CM | POA: Diagnosis not present

## 2017-10-06 LAB — BASIC METABOLIC PANEL
BUN: 9 mg/dL (ref 6–23)
CALCIUM: 9.8 mg/dL (ref 8.4–10.5)
CHLORIDE: 102 meq/L (ref 96–112)
CO2: 31 meq/L (ref 19–32)
CREATININE: 0.93 mg/dL (ref 0.40–1.20)
GFR: 65.54 mL/min (ref >60.00)
Glucose, Bld: 87 mg/dL (ref 70–99)
Potassium: 4 mEq/L (ref 3.5–5.1)
SODIUM: 140 meq/L (ref 135–145)

## 2017-10-06 LAB — LIPID PANEL
CHOL/HDL RATIO: 3
Cholesterol: 202 mg/dL — ABNORMAL HIGH (ref 0–200)
HDL: 59.3 mg/dL (ref >39.00)
LDL Cholesterol: 111 mg/dL — ABNORMAL HIGH (ref 0–99)
NONHDL: 143.11
Triglycerides: 160 mg/dL — ABNORMAL HIGH (ref 0.0–149.0)
VLDL: 32 mg/dL (ref 0.0–40.0)

## 2017-10-06 LAB — CBC WITH DIFFERENTIAL/PLATELET
BASOS PCT: 1.1 % (ref 0.0–3.0)
Basophils Absolute: 0.1 10*3/uL (ref 0.0–0.1)
Eosinophils Absolute: 0.2 10*3/uL (ref 0.0–0.7)
Eosinophils Relative: 4 % (ref 0.0–5.0)
HEMATOCRIT: 42.8 % (ref 36.0–46.0)
Hemoglobin: 14.3 g/dL (ref 12.0–15.0)
LYMPHS ABS: 2 10*3/uL (ref 0.7–4.0)
LYMPHS PCT: 35.7 % (ref 12.0–46.0)
MCHC: 33.3 g/dL (ref 30.0–36.0)
MCV: 88.4 fl (ref 78.0–100.0)
MONOS PCT: 9.2 % (ref 3.0–12.0)
Monocytes Absolute: 0.5 10*3/uL (ref 0.1–1.0)
NEUTROS ABS: 2.8 10*3/uL (ref 1.4–7.7)
NEUTROS PCT: 50 % (ref 43.0–77.0)
PLATELETS: 250 10*3/uL (ref 150.0–400.0)
RBC: 4.84 Mil/uL (ref 3.87–5.11)
RDW: 12.9 % (ref 11.5–15.5)
WBC: 5.7 10*3/uL (ref 4.0–10.5)

## 2017-10-06 LAB — URINALYSIS, ROUTINE W REFLEX MICROSCOPIC
BILIRUBIN URINE: NEGATIVE
Hgb urine dipstick: NEGATIVE
Ketones, ur: NEGATIVE
Leukocytes, UA: NEGATIVE
Nitrite: NEGATIVE
PH: 7 (ref 5.0–8.0)
RBC / HPF: NONE SEEN (ref 0–?)
SPECIFIC GRAVITY, URINE: 1.01 (ref 1.000–1.030)
Total Protein, Urine: NEGATIVE
Urine Glucose: NEGATIVE
Urobilinogen, UA: 0.2 (ref 0.0–1.0)
WBC UA: NONE SEEN (ref 0–?)

## 2017-10-06 LAB — TSH: TSH: 2.39 u[IU]/mL (ref 0.35–4.50)

## 2017-10-06 LAB — HEPATIC FUNCTION PANEL
ALK PHOS: 96 U/L (ref 39–117)
ALT: 14 U/L (ref 0–35)
AST: 20 U/L (ref 0–37)
Albumin: 4.5 g/dL (ref 3.5–5.2)
BILIRUBIN DIRECT: 0.1 mg/dL (ref 0.0–0.3)
BILIRUBIN TOTAL: 0.5 mg/dL (ref 0.2–1.2)
Total Protein: 7.5 g/dL (ref 6.0–8.3)

## 2017-10-06 MED ORDER — ESTRACE 0.1 MG/GM VA CREA: TOPICAL_CREAM | VAGINAL | 12 refills | Status: DC

## 2017-10-06 MED ORDER — CYCLOBENZAPRINE HCL 5 MG PO TABS: ORAL_TABLET | ORAL | 5 refills | Status: DC

## 2017-10-06 MED ORDER — BUSPIRONE HCL 10 MG PO TABS: 10.0000 mg | ORAL_TABLET | Freq: Three times a day (TID) | ORAL | 5 refills | Status: DC | PRN

## 2017-10-06 MED ORDER — ZOLPIDEM TARTRATE 10 MG PO TABS: 10.0000 mg | ORAL_TABLET | Freq: Every evening | ORAL | 1 refills | Status: DC | PRN

## 2017-10-06 MED ORDER — BUPROPION HCL ER (SR) 100 MG PO TB12: ORAL_TABLET | ORAL | 3 refills | Status: DC

## 2017-10-06 MED ORDER — FLUOXETINE HCL 20 MG PO CAPS: 20.0000 mg | ORAL_CAPSULE | Freq: Every day | ORAL | 3 refills | Status: DC

## 2017-10-06 MED ORDER — TRAMADOL HCL 50 MG PO TABS: 50.0000 mg | ORAL_TABLET | Freq: Four times a day (QID) | ORAL | 5 refills | Status: DC | PRN

## 2017-10-06 MED ORDER — DICLOFENAC SODIUM 1 % TD GEL: 4.0000 g | Freq: Four times a day (QID) | TRANSDERMAL | 11 refills | Status: DC | PRN

## 2017-10-06 NOTE — Progress Notes (Signed)
Subjective:    Patient ID: Denise Park, female    DOB: 1956-07-11, 61 y.o.   MRN: [id number]  HPI  Here for wellness and f/u;  Overall doing ok;  Pt denies Chest pain, worsening SOB, DOE, wheezing, orthopnea, PND, worsening LE edema, palpitations, dizziness or syncope.  Pt denies neurological change such as new headache, facial or extremity weakness.  Pt denies polydipsia, polyuria, or low sugar symptoms. Pt states overall good compliance with treatment and medications, good tolerability, and has been trying to follow appropriate diet.  Pt denies worsening depressive symptoms, suicidal ideation or panic. No fever, night sweats, wt loss, loss of appetite, or other constitutional symptoms.  Pt states good ability with ADL's, has low fall risk, home safety reviewed and adequate, no other significant changes in hearing or vision, and not active with exercise but plans to do better after finding she has gained several further lbs. No other interval hx Wt Readings from Last 3 Encounters:  10/06/17 165 lb (74.8 kg)  09/01/16 172 lb (78 kg)  09/03/15 156 lb (70.8 kg)  Declines flu shot, ok for tetanus  Does not take the fosamax, does not want to take, thinks her dentist did not want her to take it.  No plans for teeth work soon.  BP Readings from Last 3 Encounters:  10/06/17 124/86  09/01/16 128/78  09/03/15 120/82   Past Medical History:  Diagnosis Date  . ALLERGIC RHINITIS 10/30/2007  . ANEMIA-IRON DEFICIENCY 10/30/2007  . ANXIETY 10/30/2007  . BACK PAIN 02/13/2008  . COMMON MIGRAINE 10/30/2007  . DEPRESSION 10/30/2007  . GERD 10/30/2007  . Hyperlipidemia 09/26/2014  . HYPERSOMNIA 09/11/2008  . INSOMNIA-SLEEP DISORDER-UNSPEC 09/11/2008  . IRRITABLE BOWEL SYNDROME, HX OF 10/30/2007  . OSTEOPENIA 02/13/2008  . Osteopenia 05/22/2011  . PELVIC PAIN, CHRONIC 05/14/2009  . SINUSITIS- ACUTE-NOS 02/13/2008   Past Surgical History:  Procedure Laterality Date  . CESAREAN SECTION    . TUBAL LIGATION      reports that she has never smoked. She has never used smokeless tobacco. She reports that she drinks alcohol. She reports that she does not use drugs. family history includes Coronary artery disease in her other; Depression in her other. No Known Allergies Current Outpatient Prescriptions on File Prior to Visit  Medication Sig Dispense Refill  . alendronate (FOSAMAX) 70 MG tablet TAKE 1 TABLET BY MOUTH EVERY 7 DAYS. TAKE WITH A FULL GLASS OF WATER ON AN EMPTY STOMACH. 12 tablet 0  . CALCIUM-VITAMIN D PO Take by mouth daily.      . cetirizine (ZYRTEC) 10 MG tablet TAKE 1 TABLET(10 MG) BY MOUTH DAILY 90 tablet 0  . ibuprofen (ADVIL,MOTRIN) 600 MG tablet Take 1 tablet (600 mg total) by mouth every 6 (six) hours as needed. 60 tablet 2  . naproxen (NAPROSYN) 500 MG tablet Take 1 tablet (500 mg total) by mouth 2 (two) times daily with a meal. 60 tablet 11   No current facility-administered medications on file prior to visit.     Review of Systems Constitutional: Negative for other unusual diaphoresis, sweats, appetite or weight changes HENT: Negative for other worsening hearing loss, ear pain, facial swelling, mouth sores or neck stiffness.   Eyes: Negative for other worsening pain, redness or other visual disturbance.  Respiratory: Negative for other stridor or swelling Cardiovascular: Negative for other palpitations or other chest pain  Gastrointestinal: Negative for worsening diarrhea or loose stools, blood in stool, distention or other pain Genitourinary: Negative for hematuria, flank pain  or other change in urine volume.  Musculoskeletal: Negative for myalgias or other joint swelling.  Skin: Negative for other color change, or other wound or worsening drainage.  Neurological: Negative for other syncope or numbness. Hematological: Negative for other adenopathy or swelling Psychiatric/Behavioral: Negative for hallucinations, other worsening agitation, SI, self-injury, or new decreased  concentration All other system neg per pt    Objective:   Physical Exam BP 124/86   Pulse 84   Temp 98.4 F (36.9 C) (Oral)   Ht 5\' 7"  (1.702 m)   Wt 165 lb (74.8 kg)   SpO2 98%   BMI 25.84 kg/m  VS noted,  Constitutional: Pt is oriented to person, place, and time. Appears well-developed and well-nourished, in no significant distress and comfortable Head: Normocephalic and atraumatic  Eyes: Conjunctivae and EOM are normal. Pupils are equal, round, and reactive to light Right Ear: External ear normal without discharge Left Ear: External ear normal without discharge Nose: Nose without discharge or deformity Mouth/Throat: Oropharynx is without other ulcerations and moist  Neck: Normal range of motion. Neck supple. No JVD present. No tracheal deviation present or significant neck LA or mass Cardiovascular: Normal rate, regular rhythm, normal heart sounds and intact distal pulses.   Pulmonary/Chest: WOB normal and breath sounds without rales or wheezing  Abdominal: Soft. Bowel sounds are normal. NT. No HSM  Musculoskeletal: Normal range of motion. Exhibits no edema Lymphadenopathy: Has no other cervical adenopathy.  Neurological: Pt is alert and oriented to person, place, and time. Pt has normal reflexes. No cranial nerve deficit. Motor grossly intact, Gait intact Skin: Skin is warm and dry. No rash noted or new ulcerations Psychiatric:  Has mild nervousmood and affect. Behavior is normal without agitation No other exam findings  Lab Results  Component Value Date   WBC 7.2 09/01/2016   HGB 14.5 09/01/2016   HCT 42.2 09/01/2016   PLT 254.0 09/01/2016   GLUCOSE 68 (L) 09/01/2016   CHOL 223 (H) 09/01/2016   TRIG 189.0 (H) 09/01/2016   HDL 60.00 09/01/2016   LDLCALC 125 (H) 09/01/2016   ALT 14 09/01/2016   AST 20 09/01/2016   NA 139 09/01/2016   K 4.0 09/01/2016   CL 104 09/01/2016   CREATININE 0.84 09/01/2016   BUN 14 09/01/2016   CO2 32 09/01/2016   TSH 2.95 09/01/2016       Assessment & Plan:

## 2017-10-06 NOTE — Telephone Encounter (Signed)
-----   Message from Biagio Borg, MD sent at 10/06/2017  2:49 PM EDT ----- Regarding: prolia start Please look into starting the prolia for this pt

## 2017-10-06 NOTE — Assessment & Plan Note (Signed)
Goal < 100, consider statin, for lab today, cont lower chol diet

## 2017-10-06 NOTE — Patient Instructions (Addendum)
We will ask Jonelle Sidle in the office to check into the Prolia starting for you.    You had the Tetanus shot today (Tdap)  Please continue all other medications as before, and refills have been done if requested.  Please have the pharmacy call with any other refills you may need.  Please continue your efforts at being more active, low cholesterol diet, and weight control.  You are otherwise up to date with prevention measures today.  Please keep your appointments with your specialists as you may have planned  Please go to the LAB in the Basement (turn left off the elevator) for the tests to be done today  You will be contacted by phone if any changes need to be made immediately.  Otherwise, you will receive a letter about your results with an explanation, but please check with MyChart first.  Please remember to sign up for MyChart if you have not done so, as this will be important to you in the future with finding out test results, communicating by private email, and scheduling acute appointments online when needed.  Please return in 1 year for your yearly visit, or sooner if needed, with Lab testing done 3-5 days before

## 2017-10-06 NOTE — Assessment & Plan Note (Signed)

## 2017-10-06 NOTE — Telephone Encounter (Signed)
I will verifiy insurance benefits and call patient back to discuss

## 2017-10-07 ENCOUNTER — Telehealth: Payer: Self-pay

## 2017-10-07 LAB — HIV ANTIBODY (ROUTINE TESTING W REFLEX): HIV 1&2 Ab, 4th Generation: NONREACTIVE

## 2017-10-07 NOTE — Telephone Encounter (Signed)
Key: [id number] Approved 10/07/2017-12/20/2038

## 2017-11-01 ENCOUNTER — Encounter: Payer: Self-pay | Admitting: Internal Medicine

## 2017-11-14 ENCOUNTER — Encounter: Payer: Self-pay | Admitting: Internal Medicine

## 2017-11-26 ENCOUNTER — Telehealth: Payer: Self-pay

## 2017-11-26 NOTE — Telephone Encounter (Signed)
I have talked with patient about getting started on prolia injections for osteoporosis---patient;s benefits would be better to use her pharmacy benefits to cover this injection---this will require that she sign up with prolia copay program and pick up the med from her pharmacy to bring to my office for administration---patient is coming by to pick up paperwork to complete and call to get signed up on copay program---once that is finished, patient will be calling tamara, RN back to proceed with next step---patient advised to also call back if any further questions--can talk with tamara

## 2018-04-20 ENCOUNTER — Telehealth: Payer: Self-pay

## 2018-04-20 NOTE — Telephone Encounter (Signed)
Left message asking patient to either call back or bring insurance card by----prolia needs to be verified with patient's new insurance plan---can talk with Mahasin Riviere if any further questions

## 2018-06-16 ENCOUNTER — Encounter: Payer: Self-pay | Admitting: Internal Medicine

## 2018-06-24 NOTE — Telephone Encounter (Signed)
Left message for patient regarding message below.

## 2018-10-31 ENCOUNTER — Other Ambulatory Visit: Payer: Self-pay | Admitting: Internal Medicine

## 2018-11-12 ENCOUNTER — Other Ambulatory Visit: Payer: Self-pay | Admitting: Internal Medicine

## 2018-11-25 ENCOUNTER — Encounter: Payer: Self-pay | Admitting: Internal Medicine

## 2018-11-25 ENCOUNTER — Ambulatory Visit (INDEPENDENT_AMBULATORY_CARE_PROVIDER_SITE_OTHER): Payer: 59 | Admitting: Internal Medicine

## 2018-11-25 ENCOUNTER — Other Ambulatory Visit (INDEPENDENT_AMBULATORY_CARE_PROVIDER_SITE_OTHER): Payer: 59

## 2018-11-25 ENCOUNTER — Encounter

## 2018-11-25 VITALS — BP 126/82 | HR 80 | Temp 98.2°F | Ht 67.0 in | Wt 170.0 lb

## 2018-11-25 DIAGNOSIS — M810 Age-related osteoporosis without current pathological fracture: Secondary | ICD-10-CM

## 2018-11-25 DIAGNOSIS — Z0000 Encounter for general adult medical examination without abnormal findings: Secondary | ICD-10-CM | POA: Diagnosis not present

## 2018-11-25 DIAGNOSIS — D509 Iron deficiency anemia, unspecified: Secondary | ICD-10-CM | POA: Diagnosis not present

## 2018-11-25 DIAGNOSIS — E785 Hyperlipidemia, unspecified: Secondary | ICD-10-CM | POA: Diagnosis not present

## 2018-11-25 DIAGNOSIS — K1379 Other lesions of oral mucosa: Secondary | ICD-10-CM | POA: Diagnosis not present

## 2018-11-25 LAB — LIPID PANEL
CHOL/HDL RATIO: 3
Cholesterol: 215 mg/dL — ABNORMAL HIGH (ref 0–200)
HDL: 62 mg/dL (ref >39.00)
LDL Cholesterol: 126 mg/dL — ABNORMAL HIGH (ref 0–99)
NONHDL: 153.17
Triglycerides: 135 mg/dL (ref 0.0–149.0)
VLDL: 27 mg/dL (ref 0.0–40.0)

## 2018-11-25 LAB — URINALYSIS, ROUTINE W REFLEX MICROSCOPIC
Bilirubin Urine: NEGATIVE
Hgb urine dipstick: NEGATIVE
Ketones, ur: NEGATIVE
Leukocytes, UA: NEGATIVE
Nitrite: NEGATIVE
SPECIFIC GRAVITY, URINE: 1.01 (ref 1.000–1.030)
Total Protein, Urine: NEGATIVE
URINE GLUCOSE: NEGATIVE
Urobilinogen, UA: 0.2 (ref 0.0–1.0)
WBC, UA: NONE SEEN — AB (ref 0–?)
pH: 7 (ref 5.0–8.0)

## 2018-11-25 LAB — CBC WITH DIFFERENTIAL/PLATELET
Basophils Absolute: 0.1 10*3/uL (ref 0.0–0.1)
Basophils Relative: 1.3 % (ref 0.0–3.0)
EOS PCT: 2.9 % (ref 0.0–5.0)
Eosinophils Absolute: 0.2 10*3/uL (ref 0.0–0.7)
HCT: 42.4 % (ref 36.0–46.0)
Hemoglobin: 14.2 g/dL (ref 12.0–15.0)
Lymphocytes Relative: 33.4 % (ref 12.0–46.0)
Lymphs Abs: 2.4 10*3/uL (ref 0.7–4.0)
MCHC: 33.5 g/dL (ref 30.0–36.0)
MCV: 87.5 fl (ref 78.0–100.0)
Monocytes Absolute: 0.7 10*3/uL (ref 0.1–1.0)
Monocytes Relative: 9.9 % (ref 3.0–12.0)
Neutro Abs: 3.8 10*3/uL (ref 1.4–7.7)
Neutrophils Relative %: 52.5 % (ref 43.0–77.0)
Platelets: 261 10*3/uL (ref 150.0–400.0)
RBC: 4.85 Mil/uL (ref 3.87–5.11)
RDW: 13.2 % (ref 11.5–15.5)
WBC: 7.2 10*3/uL (ref 4.0–10.5)

## 2018-11-25 LAB — BASIC METABOLIC PANEL
BUN: 14 mg/dL (ref 6–23)
CO2: 28 mEq/L (ref 19–32)
Calcium: 9.5 mg/dL (ref 8.4–10.5)
Chloride: 104 mEq/L (ref 96–112)
Creatinine, Ser: 0.97 mg/dL (ref 0.40–1.20)
GFR: 62.19 mL/min (ref >60.00)
Glucose, Bld: 85 mg/dL (ref 70–99)
Potassium: 3.9 mEq/L (ref 3.5–5.1)
Sodium: 139 mEq/L (ref 135–145)

## 2018-11-25 LAB — HEPATIC FUNCTION PANEL
ALT: 13 U/L (ref 0–35)
AST: 18 U/L (ref 0–37)
Albumin: 4.4 g/dL (ref 3.5–5.2)
Alkaline Phosphatase: 94 U/L (ref 39–117)
Bilirubin, Direct: 0.1 mg/dL (ref 0.0–0.3)
Total Bilirubin: 0.4 mg/dL (ref 0.2–1.2)
Total Protein: 7.2 g/dL (ref 6.0–8.3)

## 2018-11-25 LAB — VITAMIN D 25 HYDROXY (VIT D DEFICIENCY, FRACTURES): VITD: 31.21 ng/mL (ref 30.00–100.00)

## 2018-11-25 LAB — IBC PANEL
Iron: 42 ug/dL (ref 42–145)
Saturation Ratios: 15.1 % — ABNORMAL LOW (ref 20.0–50.0)
Transferrin: 199 mg/dL — ABNORMAL LOW (ref 212.0–360.0)

## 2018-11-25 LAB — TSH: TSH: 4.66 u[IU]/mL — AB (ref 0.35–4.50)

## 2018-11-25 MED ORDER — BUSPIRONE HCL 10 MG PO TABS: 10.0000 mg | ORAL_TABLET | Freq: Three times a day (TID) | ORAL | 5 refills | Status: DC | PRN

## 2018-11-25 MED ORDER — DICLOFENAC SODIUM 1 % TD GEL: 4.0000 g | Freq: Four times a day (QID) | TRANSDERMAL | 11 refills | Status: DC | PRN

## 2018-11-25 MED ORDER — CETIRIZINE HCL 10 MG PO TABS: ORAL_TABLET | ORAL | 3 refills | Status: DC

## 2018-11-25 NOTE — Progress Notes (Signed)
Subjective:    Patient ID: Denise Park, female    DOB: 07-Jan-1956, 62 y.o.   MRN: [id number]  HPI  Here for wellness and f/u;  Overall doing ok;  Pt denies Chest pain, worsening SOB, DOE, wheezing, orthopnea, PND, worsening LE edema, palpitations, dizziness or syncope.  Pt denies neurological change such as new headache, facial or extremity weakness.  Pt denies polydipsia, polyuria, or low sugar symptoms. Pt states overall good compliance with treatment and medications, good tolerability, and has been trying to follow appropriate diet.  Pt denies worsening depressive symptoms, suicidal ideation or panic. No fever, night sweats, wt loss, loss of appetite, or other constitutional symptoms.  Pt states good ability with ADL's, has low fall risk, home safety reviewed and adequate, no other significant changes in hearing or vision, and only occasionally active with exercise.  Saw ENT for sublingual pain, tx with some type of subligual med that helped, but now ran out, plans to call for refill.  Last dxa 2016 with osteoporosis, does not want the fosamax, would like repeat dxa  No new complaints as migraine frequency has been less.  Denies worsening reflux, abd pain, dysphagia, vomiting, bowel change or blood.    No new complaints Past Medical History:  Diagnosis Date  . ALLERGIC RHINITIS 10/30/2007  . ANEMIA-IRON DEFICIENCY 10/30/2007  . ANXIETY 10/30/2007  . BACK PAIN 02/13/2008  . COMMON MIGRAINE 10/30/2007  . DEPRESSION 10/30/2007  . GERD 10/30/2007  . Hyperlipidemia 09/26/2014  . HYPERSOMNIA 09/11/2008  . INSOMNIA-SLEEP DISORDER-UNSPEC 09/11/2008  . IRRITABLE BOWEL SYNDROME, HX OF 10/30/2007  . OSTEOPENIA 02/13/2008  . Osteopenia 05/22/2011  . PELVIC PAIN, CHRONIC 05/14/2009  . SINUSITIS- ACUTE-NOS 02/13/2008   Past Surgical History:  Procedure Laterality Date  . CESAREAN SECTION    . TUBAL LIGATION      reports that she has never smoked. She has never used smokeless tobacco. She reports that she drinks  alcohol. She reports that she does not use drugs. family history includes Coronary artery disease in her other; Depression in her other. No Known Allergies Current Outpatient Medications on File Prior to Visit  Medication Sig Dispense Refill  . buPROPion (WELLBUTRIN SR) 100 MG 12 hr tablet TAKE 1 TABLET BY MOUTH TWICE A DAY 60 tablet 1  . CALCIUM-VITAMIN D PO Take by mouth daily.      . cyclobenzaprine (FLEXERIL) 5 MG tablet TAKE 1 TABLET(5 MG) BY MOUTH THREE TIMES DAILY AS NEEDED FOR MUSCLE SPASMS 60 tablet 5  . ESTRACE VAGINAL 0.1 MG/GM vaginal cream INSERT 1 GRAM VAGINALLY TWICE A WEEK 42.5 g 12  . FLUoxetine (PROZAC) 20 MG capsule TAKE 1 CAPSULE BY MOUTH EVERY DAY 90 capsule 1  . ibuprofen (ADVIL,MOTRIN) 600 MG tablet Take 1 tablet (600 mg total) by mouth every 6 (six) hours as needed. 60 tablet 2  . naproxen (NAPROSYN) 500 MG tablet Take 1 tablet (500 mg total) by mouth 2 (two) times daily with a meal. 60 tablet 11  . traMADol (ULTRAM) 50 MG tablet Take 1 tablet (50 mg total) by mouth every 6 (six) hours as needed. 120 tablet 5  . zolpidem (AMBIEN) 10 MG tablet Take 1 tablet (10 mg total) by mouth at bedtime as needed. 90 tablet 1   No current facility-administered medications on file prior to visit.    Review of Systems Constitutional: Negative for other unusual diaphoresis, sweats, appetite or weight changes HENT: Negative for other worsening hearing loss, ear pain, facial swelling, mouth sores or  neck stiffness.   Eyes: Negative for other worsening pain, redness or other visual disturbance.  Respiratory: Negative for other stridor or swelling Cardiovascular: Negative for other palpitations or other chest pain  Gastrointestinal: Negative for worsening diarrhea or loose stools, blood in stool, distention or other pain Genitourinary: Negative for hematuria, flank pain or other change in urine volume.  Musculoskeletal: Negative for myalgias or other joint swelling.  Skin: Negative for  other color change, or other wound or worsening drainage.  Neurological: Negative for other syncope or numbness. Hematological: Negative for other adenopathy or swelling Psychiatric/Behavioral: Negative for hallucinations, other worsening agitation, SI, self-injury, or new decreased concentration All other system neg per pt    Objective:   Physical Exam BP 126/82   Pulse 80   Temp 98.2 F (36.8 C) (Oral)   Ht 5\' 7"  (1.702 m)   Wt 170 lb (77.1 kg)   SpO2 95%   BMI 26.63 kg/m  VS noted,  Constitutional: Pt is oriented to person, place, and time. Appears well-developed and well-nourished, in no significant distress and comfortable Head: Normocephalic and atraumatic  Eyes: Conjunctivae and EOM are normal. Pupils are equal, round, and reactive to light Right Ear: External ear normal without discharge Left Ear: External ear normal without discharge Nose: Nose without discharge or deformity Mouth/Throat: Oropharynx is without other ulcerations and moist  Neck: Normal range of motion. Neck supple. No JVD present. No tracheal deviation present or significant neck LA or mass Cardiovascular: Normal rate, regular rhythm, normal heart sounds and intact distal pulses.   Pulmonary/Chest: WOB normal and breath sounds without rales or wheezing  Abdominal: Soft. Bowel sounds are normal. NT. No HSM  Musculoskeletal: Normal range of motion. Exhibits no edema Lymphadenopathy: Has no other cervical adenopathy.  Neurological: Pt is alert and oriented to person, place, and time. Pt has normal reflexes. No cranial nerve deficit. Motor grossly intact, Gait intact Skin: Skin is warm and dry. No rash noted or new ulcerations Psychiatric:  Has normal mood and affect. Behavior is normal without agitation No other exam findings Lab Results  Component Value Date   WBC 7.2 11/25/2018   HGB 14.2 11/25/2018   HCT 42.4 11/25/2018   PLT 261.0 11/25/2018   GLUCOSE 85 11/25/2018   CHOL 215 (H) 11/25/2018   TRIG  135.0 11/25/2018   HDL 62.00 11/25/2018   LDLCALC 126 (H) 11/25/2018   ALT 13 11/25/2018   AST 18 11/25/2018   NA 139 11/25/2018   K 3.9 11/25/2018   CL 104 11/25/2018   CREATININE 0.97 11/25/2018   BUN 14 11/25/2018   CO2 28 11/25/2018   TSH 4.66 (H) 11/25/2018       Assessment & Plan:

## 2018-11-25 NOTE — Patient Instructions (Signed)
Very sorry, they apparently dont make the paste anymore that we discussed;  Please ask at your pharmacy for any steroid type mouth paste that we could prescribe  Please continue all other medications as before, and refills have been done if requested.  Please have the pharmacy call with any other refills you may need.  Please continue your efforts at being more active, low cholesterol diet, and weight control.  You are otherwise up to date with prevention measures today.  Please keep your appointments with your specialists as you may have planned  Please schedule the bone density test before leaving today at the scheduling desk (where you check out)  Please go to the LAB in the Basement (turn left off the elevator) for the tests to be done today  You will be contacted by phone if any changes need to be made immediately.  Otherwise, you will receive a letter about your results with an explanation, but please check with MyChart first.  Please remember to sign up for MyChart if you have not done so, as this will be important to you in the future with finding out test results, communicating by private email, and scheduling acute appointments online when needed.  Please return in 1 year for your yearly visit, or sooner if needed, with Lab testing done 3-5 days before

## 2018-11-26 ENCOUNTER — Encounter: Payer: Self-pay | Admitting: Internal Medicine

## 2018-11-26 NOTE — Assessment & Plan Note (Signed)
Remote hx, for f/u iron with labs today

## 2018-11-26 NOTE — Assessment & Plan Note (Signed)
Exam benign, cont f/u ent as planned

## 2018-11-26 NOTE — Assessment & Plan Note (Signed)
stable overall by history and exam, recent data reviewed with pt, and pt to continue medical treatment as before,  to f/u any worsening symptoms or concerns, for f/u lab 

## 2018-11-26 NOTE — Assessment & Plan Note (Signed)
Pt may be willing to take prolia, for f/u dxa as is due

## 2018-11-26 NOTE — Assessment & Plan Note (Signed)

## 2018-12-06 ENCOUNTER — Other Ambulatory Visit: Payer: Self-pay | Admitting: Internal Medicine

## 2018-12-08 ENCOUNTER — Encounter: Payer: BLUE CROSS/BLUE SHIELD | Admitting: Internal Medicine

## 2018-12-23 ENCOUNTER — Telehealth: Payer: Self-pay | Admitting: Internal Medicine

## 2018-12-23 MED ORDER — PREDNISOLONE SODIUM PHOSPHATE 10 MG PO TBDP: 10.0000 mg | ORAL_TABLET | Freq: Every day | ORAL | 1 refills | Status: DC

## 2018-12-23 NOTE — Telephone Encounter (Signed)
Copied from [place] [id number]. Topic: Quick Communication - Rx Refill/Question >> Dec 23, 2018  3:52 PM [name] wrote: Medication: traMADol (ULTRAM) 50 MG tablet [[id number]]  Has the patient contacted their pharmacy? Yes  (Agent: If no, request that the patient contact the pharmacy for the refill.) (Agent: If yes, when and what did the pharmacy advise?)  Preferred Pharmacy (with phone number or street name): [id number] - [place], [place] [phone] (Phone) [phone] (Fax)    Agent: Please be advised that RX refills may take up to 3 business days. We ask that you follow-up with your pharmacy.

## 2018-12-23 NOTE — Telephone Encounter (Signed)
Done erx 

## 2018-12-23 NOTE — Addendum Note (Signed)
Addended by: Biagio Borg on: 12/23/2018 04:47 PM   Modules accepted: Orders

## 2018-12-23 NOTE — Telephone Encounter (Signed)
Copied from [place] [id number]. Topic: Quick Communication - See Telephone Encounter >> Dec 23, 2018  3:53 PM [name] wrote: CRM for notification. See Telephone encounter for: 12/23/18.  Patient is calling because she having a sore in her mouth in her mouth. Dr. Jenny Reichmann told the patient if she got the name of a medication he would write a script for it.  The patient states the medication is Prednisone ODT [id number] [address], [place] [phone] (Phone) [phone] (Fax)

## 2018-12-26 MED ORDER — TRAMADOL HCL 50 MG PO TABS: 50.0000 mg | ORAL_TABLET | Freq: Four times a day (QID) | ORAL | 2 refills | Status: DC | PRN

## 2018-12-26 NOTE — Telephone Encounter (Signed)
Done erx 

## 2019-01-09 ENCOUNTER — Ambulatory Visit (INDEPENDENT_AMBULATORY_CARE_PROVIDER_SITE_OTHER)
Admission: RE | Admit: 2019-01-09 | Discharge: 2019-01-09 | Disposition: A | Discharge status: Home / Self Care | Payer: 59 | Source: Ambulatory Visit | Attending: Internal Medicine | Admitting: Internal Medicine

## 2019-01-09 DIAGNOSIS — M810 Age-related osteoporosis without current pathological fracture: Secondary | ICD-10-CM | POA: Diagnosis not present

## 2019-01-10 ENCOUNTER — Encounter: Payer: Self-pay | Admitting: Internal Medicine

## 2019-03-07 ENCOUNTER — Telehealth: Payer: 59 | Admitting: Nurse Practitioner

## 2019-03-07 DIAGNOSIS — R05 Cough: Secondary | ICD-10-CM | POA: Diagnosis not present

## 2019-03-07 DIAGNOSIS — R059 Cough, unspecified: Secondary | ICD-10-CM

## 2019-03-07 MED ORDER — BENZONATATE 100 MG PO CAPS: 100.0000 mg | ORAL_CAPSULE | Freq: Three times a day (TID) | ORAL | 0 refills | Status: DC | PRN

## 2019-03-07 MED ORDER — AZITHROMYCIN 250 MG PO TABS: ORAL_TABLET | ORAL | 0 refills | Status: DC

## 2019-03-07 MED ORDER — PREDNISONE 10 MG (21) PO TBPK: ORAL_TABLET | ORAL | 0 refills | Status: DC

## 2019-03-07 NOTE — Progress Notes (Signed)
We are sorry that you are not feeling well.  Here is how we plan to help!  Based on your presentation I believe you most likely have A cough due to bacteria.  When patients have a fever and a productive cough with a change in color or increased sputum production, we are concerned about bacterial bronchitis.  If left untreated it can progress to pneumonia.  If your symptoms do not improve with your treatment plan it is important that you contact your provider.   I have prescribed Azithromyin 250 mg: two tablets now and then one tablet daily for 4 additonal days    In addition you may use A prescription cough medication called Tessalon Perles 100mg . You may take 1-2 capsules every 8 hours as needed for your cough.  Prednisone 10 mg daily for 6 days (see taper instructions below)  From your responses in the eVisit questionnaire you describe inflammation in the upper respiratory tract which is causing a significant cough.  This is commonly called Bronchitis and has four common causes:    Allergies  Viral Infections  Acid Reflux  Bacterial Infection Allergies, viruses and acid reflux are treated by controlling symptoms or eliminating the cause. An example might be a cough caused by taking certain blood pressure medications. You stop the cough by changing the medication. Another example might be a cough caused by acid reflux. Controlling the reflux helps control the cough.  USE OF BRONCHODILATOR ("RESCUE") INHALERS: There is a risk from using your bronchodilator too frequently.  The risk is that over-reliance on a medication which only relaxes the muscles surrounding the breathing tubes can reduce the effectiveness of medications prescribed to reduce swelling and congestion of the tubes themselves.  Although you feel brief relief from the bronchodilator inhaler, your asthma may actually be worsening with the tubes becoming more swollen and filled with mucus.  This can delay other crucial treatments,  such as oral steroid medications. If you need to use a bronchodilator inhaler daily, several times per day, you should discuss this with your provider.  There are probably better treatments that could be used to keep your asthma under control.     HOME CARE . Only take medications as instructed by your medical team. . Complete the entire course of an antibiotic. . Drink plenty of fluids and get plenty of rest. . Avoid close contacts especially the very young and the elderly . Cover your mouth if you cough or cough into your sleeve. . Always remember to wash your hands . A steam or ultrasonic humidifier can help congestion.   GET HELP RIGHT AWAY IF: . You develop worsening fever. . You become short of breath . You cough up blood. . Your symptoms persist after you have completed your treatment plan MAKE SURE YOU   Understand these instructions.  Will watch your condition.  Will get help right away if you are not doing well or get worse.  Your e-visit answers were reviewed by a board certified advanced clinical practitioner to complete your personal care plan.  Depending on the condition, your plan could have included both over the counter or prescription medications. If there is a problem please reply  once you have received a response from your provider. Your safety is important to [place].  If you have drug allergies check your prescription carefully.    You can use MyChart to ask questions about today's visit, request a non-urgent call back, or ask for a work or school excuse  for 24 hours related to this e-Visit. If it has been greater than 24 hours you will need to follow up with your provider, or enter a new e-Visit to address those concerns. You will get an e-mail in the next two days asking about your experience.  I hope that your e-visit has been valuable and will speed your recovery. Thank you for using e-visits.  5 minutes spent reviewing and documenting in chart.

## 2019-04-17 ENCOUNTER — Encounter: Payer: Self-pay | Admitting: Internal Medicine

## 2019-04-17 MED ORDER — ZOLPIDEM TARTRATE 10 MG PO TABS: 10.0000 mg | ORAL_TABLET | Freq: Every evening | ORAL | 1 refills | Status: DC | PRN

## 2019-04-17 MED ORDER — TRAMADOL HCL 50 MG PO TABS: 50.0000 mg | ORAL_TABLET | Freq: Four times a day (QID) | ORAL | 2 refills | Status: DC | PRN

## 2019-04-17 NOTE — Telephone Encounter (Signed)
Done erx 

## 2019-04-19 ENCOUNTER — Telehealth: Payer: Self-pay

## 2019-04-19 NOTE — Telephone Encounter (Signed)
Approved 04/19/2019-04/18/2020  Determination sent to scan

## 2019-04-19 NOTE — Telephone Encounter (Signed)
Key: [id number]

## 2019-05-24 ENCOUNTER — Telehealth: Payer: Self-pay

## 2019-05-24 NOTE — Telephone Encounter (Signed)
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  Dr Jenny Reichmann has recommended the intervention to be prolia if she would like to start injections---ok to transfer patient to [place] office and talk with tamara,RN

## 2019-05-29 ENCOUNTER — Other Ambulatory Visit: Payer: Self-pay | Admitting: Internal Medicine

## 2019-06-17 ENCOUNTER — Other Ambulatory Visit: Payer: Self-pay | Admitting: Internal Medicine

## 2019-10-03 ENCOUNTER — Telehealth: Payer: Self-pay | Admitting: Emergency Medicine

## 2019-10-18 NOTE — Telephone Encounter (Signed)
Error

## 2019-11-21 ENCOUNTER — Other Ambulatory Visit: Payer: Self-pay | Admitting: Internal Medicine

## 2019-11-28 ENCOUNTER — Other Ambulatory Visit (INDEPENDENT_AMBULATORY_CARE_PROVIDER_SITE_OTHER): Payer: 59

## 2019-11-28 ENCOUNTER — Ambulatory Visit (INDEPENDENT_AMBULATORY_CARE_PROVIDER_SITE_OTHER): Payer: 59 | Admitting: Internal Medicine

## 2019-11-28 ENCOUNTER — Other Ambulatory Visit: Payer: Self-pay

## 2019-11-28 ENCOUNTER — Encounter: Payer: Self-pay | Admitting: Internal Medicine

## 2019-11-28 VITALS — BP 122/78 | HR 88 | Temp 98.5°F | Ht 67.0 in | Wt 168.0 lb

## 2019-11-28 DIAGNOSIS — K219 Gastro-esophageal reflux disease without esophagitis: Secondary | ICD-10-CM

## 2019-11-28 DIAGNOSIS — M79671 Pain in right foot: Secondary | ICD-10-CM | POA: Insufficient documentation

## 2019-11-28 DIAGNOSIS — F32A Depression, unspecified: Secondary | ICD-10-CM

## 2019-11-28 DIAGNOSIS — F411 Generalized anxiety disorder: Secondary | ICD-10-CM

## 2019-11-28 DIAGNOSIS — R0683 Snoring: Secondary | ICD-10-CM

## 2019-11-28 DIAGNOSIS — E538 Deficiency of other specified B group vitamins: Secondary | ICD-10-CM

## 2019-11-28 DIAGNOSIS — E611 Iron deficiency: Secondary | ICD-10-CM | POA: Diagnosis not present

## 2019-11-28 DIAGNOSIS — M1990 Unspecified osteoarthritis, unspecified site: Secondary | ICD-10-CM

## 2019-11-28 DIAGNOSIS — Z0001 Encounter for general adult medical examination with abnormal findings: Secondary | ICD-10-CM

## 2019-11-28 DIAGNOSIS — Z0000 Encounter for general adult medical examination without abnormal findings: Secondary | ICD-10-CM

## 2019-11-28 DIAGNOSIS — E785 Hyperlipidemia, unspecified: Secondary | ICD-10-CM

## 2019-11-28 DIAGNOSIS — E559 Vitamin D deficiency, unspecified: Secondary | ICD-10-CM

## 2019-11-28 DIAGNOSIS — F329 Major depressive disorder, single episode, unspecified: Secondary | ICD-10-CM

## 2019-11-28 LAB — BASIC METABOLIC PANEL
BUN: 14 mg/dL (ref 6–23)
CO2: 29 mEq/L (ref 19–32)
Calcium: 9.7 mg/dL (ref 8.4–10.5)
Chloride: 104 mEq/L (ref 96–112)
Creatinine, Ser: 0.97 mg/dL (ref 0.40–1.20)
GFR: 58.32 mL/min — ABNORMAL LOW (ref >60.00)
Glucose, Bld: 86 mg/dL (ref 70–99)
Potassium: 3.9 mEq/L (ref 3.5–5.1)
Sodium: 139 mEq/L (ref 135–145)

## 2019-11-28 LAB — CBC WITH DIFFERENTIAL/PLATELET
Basophils Absolute: 0.1 10*3/uL (ref 0.0–0.1)
Basophils Relative: 1.1 % (ref 0.0–3.0)
Eosinophils Absolute: 0.2 10*3/uL (ref 0.0–0.7)
Eosinophils Relative: 2.8 % (ref 0.0–5.0)
HCT: 43.4 % (ref 36.0–46.0)
Hemoglobin: 14.3 g/dL (ref 12.0–15.0)
Lymphocytes Relative: 29.9 % (ref 12.0–46.0)
Lymphs Abs: 2.4 10*3/uL (ref 0.7–4.0)
MCHC: 32.9 g/dL (ref 30.0–36.0)
MCV: 87.7 fl (ref 78.0–100.0)
Monocytes Absolute: 0.7 10*3/uL (ref 0.1–1.0)
Monocytes Relative: 8.9 % (ref 3.0–12.0)
Neutro Abs: 4.6 10*3/uL (ref 1.4–7.7)
Neutrophils Relative %: 57.3 % (ref 43.0–77.0)
Platelets: 261 10*3/uL (ref 150.0–400.0)
RBC: 4.95 Mil/uL (ref 3.87–5.11)
RDW: 13 % (ref 11.5–15.5)
WBC: 8 10*3/uL (ref 4.0–10.5)

## 2019-11-28 LAB — IBC PANEL
Iron: 70 ug/dL (ref 42–145)
Saturation Ratios: 29.1 % (ref 20.0–50.0)
Transferrin: 172 mg/dL — ABNORMAL LOW (ref 212.0–360.0)

## 2019-11-28 LAB — URINALYSIS, ROUTINE W REFLEX MICROSCOPIC
Bilirubin Urine: NEGATIVE
Hgb urine dipstick: NEGATIVE
Ketones, ur: NEGATIVE
Nitrite: NEGATIVE
Specific Gravity, Urine: 1.01 (ref 1.000–1.030)
Total Protein, Urine: NEGATIVE
Urine Glucose: NEGATIVE
Urobilinogen, UA: 0.2 (ref 0.0–1.0)
pH: 7 (ref 5.0–8.0)

## 2019-11-28 LAB — LIPID PANEL
Cholesterol: 202 mg/dL — ABNORMAL HIGH (ref 0–200)
HDL: 57.4 mg/dL (ref >39.00)
LDL Cholesterol: 125 mg/dL — ABNORMAL HIGH (ref 0–99)
NonHDL: 145.09
Total CHOL/HDL Ratio: 4
Triglycerides: 100 mg/dL (ref 0.0–149.0)
VLDL: 20 mg/dL (ref 0.0–40.0)

## 2019-11-28 LAB — HEPATIC FUNCTION PANEL
ALT: 13 U/L (ref 0–35)
AST: 19 U/L (ref 0–37)
Albumin: 4.4 g/dL (ref 3.5–5.2)
Alkaline Phosphatase: 100 U/L (ref 39–117)
Bilirubin, Direct: 0.1 mg/dL (ref 0.0–0.3)
Total Bilirubin: 0.5 mg/dL (ref 0.2–1.2)
Total Protein: 7.1 g/dL (ref 6.0–8.3)

## 2019-11-28 LAB — TSH: TSH: 2.51 u[IU]/mL (ref 0.35–4.50)

## 2019-11-28 LAB — VITAMIN D 25 HYDROXY (VIT D DEFICIENCY, FRACTURES): VITD: 36.06 ng/mL (ref 30.00–100.00)

## 2019-11-28 LAB — VITAMIN B12: Vitamin B-12: 288 pg/mL (ref 211–911)

## 2019-11-28 MED ORDER — ALPRAZOLAM 0.25 MG PO TABS: 0.2500 mg | ORAL_TABLET | Freq: Two times a day (BID) | ORAL | 2 refills | Status: DC | PRN

## 2019-11-28 MED ORDER — MELOXICAM 15 MG PO TABS: 15.0000 mg | ORAL_TABLET | Freq: Every day | ORAL | 3 refills | Status: DC | PRN

## 2019-11-28 MED ORDER — FLUOXETINE HCL 20 MG PO CAPS: ORAL_CAPSULE | ORAL | 3 refills | Status: DC

## 2019-11-28 MED ORDER — BUPROPION HCL ER (SR) 100 MG PO TB12: 100.0000 mg | ORAL_TABLET | Freq: Two times a day (BID) | ORAL | 3 refills | Status: DC

## 2019-11-28 MED ORDER — BUSPIRONE HCL 10 MG PO TABS: 10.0000 mg | ORAL_TABLET | Freq: Three times a day (TID) | ORAL | 5 refills | Status: DC | PRN

## 2019-11-28 MED ORDER — ZOLPIDEM TARTRATE 10 MG PO TABS: 10.0000 mg | ORAL_TABLET | Freq: Every evening | ORAL | 1 refills | Status: DC | PRN

## 2019-11-28 MED ORDER — TRAMADOL HCL 50 MG PO TABS: 50.0000 mg | ORAL_TABLET | Freq: Four times a day (QID) | ORAL | 2 refills | Status: DC | PRN

## 2019-11-28 NOTE — Assessment & Plan Note (Signed)
For xanax prn,  to f/u any worsening symptoms or concerns 

## 2019-11-28 NOTE — Assessment & Plan Note (Signed)
stable overall by history and exam, recent data reviewed with pt, and pt to continue medical treatment as before,  to f/u any worsening symptoms or concerns  

## 2019-11-28 NOTE — Patient Instructions (Signed)
Please take all new medication as prescribed - the xanax, and mobic for pain  Please continue all other medications as before, and refills have been done if requested, and also the pepcid at 20 mg twice per day is OK  Please have the pharmacy call with any other refills you may need.  Please continue your efforts at being more active, low cholesterol diet, and weight control.  You are otherwise up to date with prevention measures today.  Please keep your appointments with your specialists as you may have planned  Please call if you change your mind about the podiatry for the right foot, or pulmonary for possible sleep apnea  Please go to the LAB in the Basement (turn left off the elevator) for the tests to be done today You will be contacted by phone if any changes need to be made immediately.  Otherwise, you will receive a letter about your results with an explanation, but please check with MyChart first.  Please remember to sign up for MyChart if you have not done so, as this will be important to you in the future with finding out test results, communicating by private email, and scheduling acute appointments online when needed.  Please return in 6 months, or sooner if needed

## 2019-11-28 NOTE — Assessment & Plan Note (Signed)
For mobic prn, declines podiatry

## 2019-11-28 NOTE — Assessment & Plan Note (Addendum)
With worsening symptoms for pepcid 20 bid otc  In addition to the time spent performing CPE, I spent an additional 25 minutes face to face,in which greater than 50% of this time was spent in counseling and coordination of care for patient's acute illness as documented, including the differential dx, treatment, further evaluation and other management of gerd, HLD, depression, anxiety, snoring, right foot pain, arthritis

## 2019-11-28 NOTE — Assessment & Plan Note (Signed)

## 2019-11-28 NOTE — Assessment & Plan Note (Signed)
With possible osa, decilnes pulm referral

## 2019-11-28 NOTE — Progress Notes (Signed)
Subjective:    Patient ID: Denise Park, female    DOB: 07-14-1956, 63 y.o.   MRN: [id number]  HPI  Here for wellness and f/u;  Overall doing ok;  Pt denies Chest pain, worsening SOB, DOE, wheezing, orthopnea, PND, worsening LE edema, palpitations, dizziness or syncope.  Pt denies neurological change such as new headache, facial or extremity weakness.  Pt denies polydipsia, polyuria, or low sugar symptoms. Pt states overall good compliance with treatment and medications, good tolerability, and has been trying to follow appropriate diet. No fever, night sweats, wt loss, loss of appetite, or other constitutional symptoms.  Pt states good ability with ADL's, has low fall risk, home safety reviewed and adequate, no other significant changes in hearing or vision, and only occasionally active with exercise. Also, Denies worsening depressive symptoms, suicidal ideation, or panic; has ongoing anxiety, increased recently due to stressors Also with ongoing arthritis pain, mild to mod, intermittent, worse in the hands after use, nothing else makes better or worse.  Also with pain and a lump sometimes feeling to the plantar mid foot with ambuation.  Also Denies has had some wt gain and mild increased reflux, abd pain, dysphagia, n/v, bowel change or blood.  Also with increased snoring and daytime fatigue, tends to fall asleep easy after work. Past Medical History:  Diagnosis Date  . ALLERGIC RHINITIS 10/30/2007  . ANEMIA-IRON DEFICIENCY 10/30/2007  . ANXIETY 10/30/2007  . BACK PAIN 02/13/2008  . COMMON MIGRAINE 10/30/2007  . DEPRESSION 10/30/2007  . GERD 10/30/2007  . Hyperlipidemia 09/26/2014  . HYPERSOMNIA 09/11/2008  . INSOMNIA-SLEEP DISORDER-UNSPEC 09/11/2008  . IRRITABLE BOWEL SYNDROME, HX OF 10/30/2007  . OSTEOPENIA 02/13/2008  . Osteopenia 05/22/2011  . PELVIC PAIN, CHRONIC 05/14/2009  . SINUSITIS- ACUTE-NOS 02/13/2008   Past Surgical History:  Procedure Laterality Date  . CESAREAN SECTION    . TUBAL  LIGATION      reports that she has never smoked. She has never used smokeless tobacco. She reports current alcohol use. She reports that she does not use drugs. family history includes Coronary artery disease in an other family member; Depression in an other family member. No Known Allergies Current Outpatient Medications on File Prior to Visit  Medication Sig Dispense Refill  . CALCIUM-VITAMIN D PO Take by mouth daily.      . cetirizine (ZYRTEC) 10 MG tablet TAKE 1 TABLET(10 MG) BY MOUTH DAILY 90 tablet 3  . diclofenac sodium (VOLTAREN) 1 % GEL Apply 4 g topically 4 (four) times daily as needed. 400 g 11  . ESTRACE VAGINAL 0.1 MG/GM vaginal cream INSERT 1 GRAM VAGINALLY TWICE A WEEK 42.5 g 12   No current facility-administered medications on file prior to visit.    Review of Systems Constitutional: Negative for other unusual diaphoresis, sweats, appetite or weight changes HENT: Negative for other worsening hearing loss, ear pain, facial swelling, mouth sores or neck stiffness.   Eyes: Negative for other worsening pain, redness or other visual disturbance.  Respiratory: Negative for other stridor or swelling Cardiovascular: Negative for other palpitations or other chest pain  Gastrointestinal: Negative for worsening diarrhea or loose stools, blood in stool, distention or other pain Genitourinary: Negative for hematuria, flank pain or other change in urine volume.  Musculoskeletal: Negative for myalgias or other joint swelling.  Skin: Negative for other color change, or other wound or worsening drainage.  Neurological: Negative for other syncope or numbness. Hematological: Negative for other adenopathy or swelling Psychiatric/Behavioral: Negative for hallucinations, other worsening agitation,  SI, self-injury, or new decreased concentration All otherwise neg per pt     Objective:   Physical Exam BP 122/78   Pulse 88   Temp 98.5 F (36.9 C) (Oral)   Ht 5\' 7"  (1.702 m)   Wt 168 lb  (76.2 kg)   SpO2 95%   BMI 26.31 kg/m  VS noted,  Constitutional: Pt is oriented to person, place, and time. Appears well-developed and well-nourished, in no significant distress and comfortable Head: Normocephalic and atraumatic  Eyes: Conjunctivae and EOM are normal. Pupils are equal, round, and reactive to light Right Ear: External ear normal without discharge Left Ear: External ear normal without discharge Nose: Nose without discharge or deformity Mouth/Throat: Oropharynx is without other ulcerations and moist  Neck: Normal range of motion. Neck supple. No JVD present. No tracheal deviation present or significant neck LA or mass Cardiovascular: Normal rate, regular rhythm, normal heart sounds and intact distal pulses.   Pulmonary/Chest: WOB normal and breath sounds without rales or wheezing  Abdominal: Soft. Bowel sounds are normal. NT. No HSM  Musculoskeletal: Normal range of motion. Exhibits no edema Lymphadenopathy: Has no other cervical adenopathy.  Neurological: Pt is alert and oriented to person, place, and time. Pt has normal reflexes. No cranial nerve deficit. Motor grossly intact, Gait intact Skin: Skin is warm and dry. No rash noted or new ulcerations Psychiatric:  Has nervous mood and affect. Behavior is normal without agitation All otherwise neg per pt Lab Results  Component Value Date   WBC 8.0 11/28/2019   HGB 14.3 11/28/2019   HCT 43.4 11/28/2019   PLT 261.0 11/28/2019   GLUCOSE 86 11/28/2019   CHOL 202 (H) 11/28/2019   TRIG 100.0 11/28/2019   HDL 57.40 11/28/2019   LDLCALC 125 (H) 11/28/2019   ALT 13 11/28/2019   AST 19 11/28/2019   NA 139 11/28/2019   K 3.9 11/28/2019   CL 104 11/28/2019   CREATININE 0.97 11/28/2019   BUN 14 11/28/2019   CO2 29 11/28/2019   TSH 2.51 11/28/2019        Assessment & Plan:

## 2019-11-28 NOTE — Assessment & Plan Note (Addendum)
Ok for mobic prn,  to f/u any worsening symptoms or concerns 

## 2019-12-30 ENCOUNTER — Other Ambulatory Visit: Payer: Self-pay | Admitting: Internal Medicine

## 2019-12-30 NOTE — Telephone Encounter (Signed)
Please to call pt - needs ROV for further refills

## 2020-01-01 NOTE — Telephone Encounter (Signed)
Patient is scheduled with PCP 05/28/20.

## 2020-01-25 ENCOUNTER — Other Ambulatory Visit: Payer: Self-pay | Admitting: Internal Medicine

## 2020-05-28 ENCOUNTER — Ambulatory Visit: Payer: 59 | Admitting: Internal Medicine

## 2020-05-29 ENCOUNTER — Other Ambulatory Visit: Payer: Self-pay

## 2020-05-29 ENCOUNTER — Ambulatory Visit (INDEPENDENT_AMBULATORY_CARE_PROVIDER_SITE_OTHER): Payer: 59 | Admitting: Internal Medicine

## 2020-05-29 ENCOUNTER — Encounter: Payer: Self-pay | Admitting: Internal Medicine

## 2020-05-29 VITALS — BP 126/72 | HR 83 | Temp 98.5°F | Ht 67.0 in | Wt 168.0 lb

## 2020-05-29 DIAGNOSIS — E559 Vitamin D deficiency, unspecified: Secondary | ICD-10-CM | POA: Diagnosis not present

## 2020-05-29 DIAGNOSIS — E785 Hyperlipidemia, unspecified: Secondary | ICD-10-CM | POA: Diagnosis not present

## 2020-05-29 DIAGNOSIS — F329 Major depressive disorder, single episode, unspecified: Secondary | ICD-10-CM

## 2020-05-29 DIAGNOSIS — Z0000 Encounter for general adult medical examination without abnormal findings: Secondary | ICD-10-CM

## 2020-05-29 DIAGNOSIS — K219 Gastro-esophageal reflux disease without esophagitis: Secondary | ICD-10-CM | POA: Diagnosis not present

## 2020-05-29 DIAGNOSIS — F32A Depression, unspecified: Secondary | ICD-10-CM

## 2020-05-29 DIAGNOSIS — M778 Other enthesopathies, not elsewhere classified: Secondary | ICD-10-CM

## 2020-05-29 DIAGNOSIS — Z0001 Encounter for general adult medical examination with abnormal findings: Secondary | ICD-10-CM | POA: Diagnosis not present

## 2020-05-29 DIAGNOSIS — N951 Menopausal and female climacteric states: Secondary | ICD-10-CM | POA: Insufficient documentation

## 2020-05-29 DIAGNOSIS — N952 Postmenopausal atrophic vaginitis: Secondary | ICD-10-CM | POA: Insufficient documentation

## 2020-05-29 DIAGNOSIS — E538 Deficiency of other specified B group vitamins: Secondary | ICD-10-CM | POA: Diagnosis not present

## 2020-05-29 DIAGNOSIS — N950 Postmenopausal bleeding: Secondary | ICD-10-CM | POA: Insufficient documentation

## 2020-05-29 MED ORDER — DICLOFENAC SODIUM 1 % EX GEL: 2.0000 g | Freq: Four times a day (QID) | CUTANEOUS | 5 refills | Status: DC

## 2020-05-29 NOTE — Patient Instructions (Addendum)
Ok to stop the prozac all at once now  Please take all new medication as prescribed - the voltaren gel as needed  Please consider an appointment with Sports Medicine in this office if the tendonitis is not improved in 1-2 weeks  Please continue all other medications as before, including the wellbutrin for now  Please have the pharmacy call with any other refills you may need.  Please continue your efforts at being more active, low cholesterol diet, and weight control.  Please keep your appointments with your specialists as you may have planned  Please make an Appointment to return in 6 months, or sooner if needed, also with Lab Appointment for testing done 3-5 days before at the [facility] (so this is for TWO appointments - please see the scheduling desk as you leave)

## 2020-05-29 NOTE — Progress Notes (Signed)
Subjective:    Patient ID: Denise Park, female    DOB: May 21, 1956, 64 y.o.   MRN: [id number]  HPI  Here for wellness and f/u;  Overall doing ok;  Pt denies Chest pain, worsening SOB, DOE, wheezing, orthopnea, PND, worsening LE edema, palpitations, dizziness or syncope.  Pt denies neurological change such as new headache, facial or extremity weakness.  Pt denies polydipsia, polyuria, or low sugar symptoms. Pt states overall good compliance with treatment and medications, good tolerability, and has been trying to follow appropriate diet.  Pt denies worsening depressive symptoms, suicidal ideation or panic. No fever, night sweats, wt loss, loss of appetite, or other constitutional symptoms.  Pt states good ability with ADL's, has low fall risk, home safety reviewed and adequate, no other significant changes in hearing or vision, and only occasionally active with exercise.   Also has c/o pain to left thumb dorsal compartment for several wks mild to mod.  Asks to d/c prozac if possible.  Denies worsening reflux, abd pain, dysphagia, n/v, bowel change or blood. Past Medical History:  Diagnosis Date  . ALLERGIC RHINITIS 10/30/2007  . ANEMIA-IRON DEFICIENCY 10/30/2007  . ANXIETY 10/30/2007  . BACK PAIN 02/13/2008  . COMMON MIGRAINE 10/30/2007  . DEPRESSION 10/30/2007  . GERD 10/30/2007  . Hyperlipidemia 09/26/2014  . HYPERSOMNIA 09/11/2008  . INSOMNIA-SLEEP DISORDER-UNSPEC 09/11/2008  . IRRITABLE BOWEL SYNDROME, HX OF 10/30/2007  . OSTEOPENIA 02/13/2008  . Osteopenia 05/22/2011  . PELVIC PAIN, CHRONIC 05/14/2009  . SINUSITIS- ACUTE-NOS 02/13/2008   Past Surgical History:  Procedure Laterality Date  . CESAREAN SECTION    . TUBAL LIGATION      reports that she has never smoked. She has never used smokeless tobacco. She reports current alcohol use. She reports that she does not use drugs. family history includes Coronary artery disease in an other family member; Depression in an other family member. No Known  Allergies Current Outpatient Medications on File Prior to Visit  Medication Sig Dispense Refill  . ALPRAZolam (XANAX) 0.25 MG tablet Take 1 tablet (0.25 mg total) by mouth 2 (two) times daily as needed for anxiety. 60 tablet 2  . buPROPion (WELLBUTRIN SR) 100 MG 12 hr tablet Take 1 tablet (100 mg total) by mouth 2 (two) times daily. 180 tablet 3  . busPIRone (BUSPAR) 10 MG tablet TAKE 1 TABLET BY MOUTH THREE TIMES A DAY AS NEEDED 270 tablet 1  . CALCIUM-VITAMIN D PO Take by mouth daily.      . cetirizine (ZYRTEC) 10 MG tablet TAKE 1 TABLET(10 MG) BY MOUTH DAILY 90 tablet 3  . ESTRACE VAGINAL 0.1 MG/GM vaginal cream INSERT 1 GRAM VAGINALLY TWICE A WEEK 42.5 g 12  . FLUoxetine (PROZAC) 20 MG capsule TAKE 1 CAPSULE BY MOUTH EVERY DAY 90 capsule 3  . IMVEXXY MAINTENANCE PACK 4 MCG INST Place 1 capsule vaginally 2 (two) times a week.    . meloxicam (MOBIC) 15 MG tablet Take 1 tablet (15 mg total) by mouth daily as needed for pain. 90 tablet 3  . traMADol (ULTRAM) 50 MG tablet Take 1 tablet (50 mg total) by mouth every 6 (six) hours as needed. 120 tablet 2  . zolpidem (AMBIEN) 10 MG tablet Take 1 tablet (10 mg total) by mouth at bedtime as needed. 90 tablet 1   No current facility-administered medications on file prior to visit.   Review of Systems All otherwise neg per pt    Objective:   Physical Exam BP 126/72 (BP Location:  Left Arm, Patient Position: Sitting, Cuff Size: Large)   Pulse 83   Temp 98.5 F (36.9 C) (Oral)   Ht 5\' 7"  (1.702 m)   Wt 168 lb (76.2 kg)   SpO2 96%   BMI 26.31 kg/m  \VS noted,  Constitutional: Pt appears in NAD HENT: Head: NCAT.  Right Ear: External ear normal.  Left Ear: External ear normal.  Eyes: . Pupils are equal, round, and reactive to light. Conjunctivae and EOM are normal Nose: without d/c or deformity Neck: Neck supple. Gross normal ROM Cardiovascular: Normal rate and regular rhythm.   Pulmonary/Chest: Effort normal and breath sounds without rales  or wheezing.  Abd:  Soft, NT, ND, + BS, no organomegaly Left thumb first dorsal compartment with tender to left arm prox to the wrist Neurological: Pt is alert. At baseline orientation, motor grossly intact Skin: Skin is warm. No rashes, other new lesions, no LE edema Psychiatric: Pt behavior is normal without agitation  All otherwise neg per pt Lab Results  Component Value Date   WBC 8.0 11/28/2019   HGB 14.3 11/28/2019   HCT 43.4 11/28/2019   PLT 261.0 11/28/2019   GLUCOSE 86 11/28/2019   CHOL 202 (H) 11/28/2019   TRIG 100.0 11/28/2019   HDL 57.40 11/28/2019   LDLCALC 125 (H) 11/28/2019   ALT 13 11/28/2019   AST 19 11/28/2019   NA 139 11/28/2019   K 3.9 11/28/2019   CL 104 11/28/2019   CREATININE 0.97 11/28/2019   BUN 14 11/28/2019   CO2 29 11/28/2019   TSH 2.51 11/28/2019      Assessment & Plan:

## 2020-05-31 ENCOUNTER — Ambulatory Visit: Payer: 59 | Admitting: Internal Medicine

## 2020-06-02 ENCOUNTER — Encounter: Payer: Self-pay | Admitting: Internal Medicine

## 2020-06-02 DIAGNOSIS — M778 Other enthesopathies, not elsewhere classified: Secondary | ICD-10-CM | POA: Insufficient documentation

## 2020-06-02 NOTE — Assessment & Plan Note (Addendum)
Mild to mod, for volt gel prn, consider f/u sports medicine,  to f/u any worsening symptoms or concerns  I spent 31 minutes in addition to time for CPX wellness examination in preparing to see the patient by review of recent labs, imaging and procedures, obtaining and reviewing separately obtained history, communicating with the patient and family or caregiver, ordering medications, tests or procedures, and documenting clinical information in the EHR including the differential Dx, treatment, and any further evaluation and other management of left thumb tendonitis, hld, gerd, depression

## 2020-06-02 NOTE — Assessment & Plan Note (Signed)

## 2020-06-02 NOTE — Assessment & Plan Note (Signed)
Ok to d/c prozac, o/w stable overall by history and exam, recent data reviewed with pt, and pt to continue medical treatment as before,  to f/u any worsening symptoms or concerns

## 2020-06-02 NOTE — Assessment & Plan Note (Signed)
stable overall by history and exam, recent data reviewed with pt, and pt to continue medical treatment as before,  to f/u any worsening symptoms or concerns  

## 2020-09-30 ENCOUNTER — Encounter: Payer: Self-pay | Admitting: Internal Medicine

## 2020-09-30 ENCOUNTER — Other Ambulatory Visit: Payer: Self-pay

## 2020-09-30 ENCOUNTER — Ambulatory Visit: Payer: 59 | Admitting: Internal Medicine

## 2020-09-30 VITALS — BP 126/78 | HR 71 | Temp 98.2°F | Resp 16 | Ht 67.0 in | Wt 161.0 lb

## 2020-09-30 DIAGNOSIS — M7022 Olecranon bursitis, left elbow: Secondary | ICD-10-CM | POA: Insufficient documentation

## 2020-09-30 DIAGNOSIS — L03114 Cellulitis of left upper limb: Secondary | ICD-10-CM | POA: Diagnosis not present

## 2020-09-30 MED ORDER — METHYLPREDNISOLONE ACETATE 80 MG/ML IJ SUSP
80.0000 mg | Freq: Once | INTRAMUSCULAR | Status: AC
  Administered 2020-09-30: 80 mg via INTRAMUSCULAR

## 2020-09-30 MED ORDER — AMOXICILLIN-POT CLAVULANATE 875-125 MG PO TABS: 1.0000 | ORAL_TABLET | Freq: Two times a day (BID) | ORAL | 0 refills | Status: DC

## 2020-09-30 NOTE — Progress Notes (Signed)
Subjective:  Patient ID: Denise Park, female    DOB: 07/24/1956  Age: 64 y.o. MRN: [id number]  CC: Elbow Pain  This visit occurred during the SARS-CoV-2 public health emergency.  Safety protocols were in place, including screening questions prior to the visit, additional usage of staff PPE, and extensive cleaning of exam room while observing appropriate contact time as indicated for disinfecting solutions.    HPI Denise Park presents for the complaint of a 1 week history of pain, redness, and swelling over her left elbow.  She denies trauma or injury.  Outpatient Medications Prior to Visit  Medication Sig Dispense Refill  . ALPRAZolam (XANAX) 0.25 MG tablet Take 1 tablet (0.25 mg total) by mouth 2 (two) times daily as needed for anxiety. 60 tablet 2  . buPROPion (WELLBUTRIN SR) 100 MG 12 hr tablet Take 1 tablet (100 mg total) by mouth 2 (two) times daily. 180 tablet 3  . busPIRone (BUSPAR) 10 MG tablet TAKE 1 TABLET BY MOUTH THREE TIMES A DAY AS NEEDED 270 tablet 1  . CALCIUM-VITAMIN D PO Take by mouth daily.      . cetirizine (ZYRTEC) 10 MG tablet TAKE 1 TABLET(10 MG) BY MOUTH DAILY 90 tablet 3  . diclofenac Sodium (VOLTAREN) 1 % GEL Apply 2 g topically 4 (four) times daily. 100 g 5  . ESTRACE VAGINAL 0.1 MG/GM vaginal cream INSERT 1 GRAM VAGINALLY TWICE A WEEK 42.5 g 12  . FLUoxetine (PROZAC) 20 MG capsule TAKE 1 CAPSULE BY MOUTH EVERY DAY 90 capsule 3  . IMVEXXY MAINTENANCE PACK 4 MCG INST Place 1 capsule vaginally 2 (two) times a week.    . meloxicam (MOBIC) 15 MG tablet Take 1 tablet (15 mg total) by mouth daily as needed for pain. 90 tablet 3  . traMADol (ULTRAM) 50 MG tablet Take 1 tablet (50 mg total) by mouth every 6 (six) hours as needed. 120 tablet 2  . zolpidem (AMBIEN) 10 MG tablet Take 1 tablet (10 mg total) by mouth at bedtime as needed. 90 tablet 1   No facility-administered medications prior to visit.    ROS Review of Systems  Constitutional: Negative for  chills, fatigue and fever.  HENT: Negative.   Eyes: Negative.   Respiratory: Negative for cough, chest tightness and wheezing.   Cardiovascular: Negative for chest pain and palpitations.  Gastrointestinal: Negative for abdominal pain.  Endocrine: Negative.   Genitourinary: Negative.  Negative for difficulty urinating.  Musculoskeletal: Positive for arthralgias.  Skin: Positive for color change.  Hematological: Negative for adenopathy. Does not bruise/bleed easily.    Objective:  BP 126/78   Pulse 71   Temp 98.2 F (36.8 C) (Oral)   Resp 16   Ht 5\' 7"  (1.702 m)   Wt 161 lb (73 kg)   SpO2 96%   BMI 25.22 kg/m   BP Readings from Last 3 Encounters:  09/30/20 126/78  05/29/20 126/72  11/28/19 122/78    Wt Readings from Last 3 Encounters:  09/30/20 161 lb (73 kg)  05/29/20 168 lb (76.2 kg)  11/28/19 168 lb (76.2 kg)    Physical Exam Musculoskeletal:     Right elbow: Normal.     Left elbow: Swelling and effusion present. Normal range of motion. Tenderness present.     Comments: Over the left olecranon bursa there is a moderately sized area of erythema, warmth, and tenderness.  In the center this is a small, healed excoriation.  There is no induration, fluctuance, pustules, or  formed abscess. There is no lymphangitic streaking.  There is a palpable fluid accumulation over the olecranon bursa.     Lab Results  Component Value Date   WBC 8.0 11/28/2019   HGB 14.3 11/28/2019   HCT 43.4 11/28/2019   PLT 261.0 11/28/2019   GLUCOSE 86 11/28/2019   CHOL 202 (H) 11/28/2019   TRIG 100.0 11/28/2019   HDL 57.40 11/28/2019   LDLCALC 125 (H) 11/28/2019   ALT 13 11/28/2019   AST 19 11/28/2019   NA 139 11/28/2019   K 3.9 11/28/2019   CL 104 11/28/2019   CREATININE 0.97 11/28/2019   BUN 14 11/28/2019   CO2 29 11/28/2019   TSH 2.51 11/28/2019    DG Bone Density  Result Date: 01/15/2019 Date of study: 01/09/19 Exam: DUAL X-RAY ABSORPTIOMETRY (DXA) FOR BONE MINERAL DENSITY  (BMD) Instrument: [employer] Provider: PCP Indication: follow up for low BMD Comparison: none (please note that it is not possible to compare data from different instruments) Clinical data: Pt is a 64 y.o. female without previous history of fracture. On calcium and vitamin D. Results:  Lumbar spine L1-L4 Femoral neck (FN) 33% distal radius T-score -2.3 RFN: -1.0 LFN: -1.0 n/a Change in BMD from previous DXA test (%) Up 3.1% Down 3.1% n/a (*) statistically significant Assessment: the BMD is low according to the [facility] classification for osteoporosis (see below). Fracture risk: moderate FRAX score: 10 year major osteoporotic risk: 7.2%. 10 year hip fracture risk: 8.4%. The thresholds for treatment are 20% and 3%, respectively. Comments: the technical quality of the study is good. Evaluation for secondary causes should be considered if clinically indicated. Recommend optimizing calcium (1200 mg/day) and vitamin D (800 IU/day) intake. Followup: Repeat BMD is appropriate after 2 years or after 1-2 years if starting treatment. WHO criteria for diagnosis of osteoporosis in postmenopausal women and in men 49 y/o or older: - normal: T-score -1.0 to + 1.0 - osteopenia/low bone density: T-score between -2.5 and -1.0 - osteoporosis: T-score below -2.5 - severe osteoporosis: T-score below -2.5 with history of fragility fracture Note: although not part of the WHO classification, the presence of a fragility fracture, regardless of the T-score, should be considered diagnostic of osteoporosis, provided other causes for the fracture have been excluded. Treatment: The National Osteoporosis Foundation recommends that treatment be considered in postmenopausal women and men age 63 or older with: 1. Hip or vertebral (clinical or morphometric) fracture 2. T-score of - 2.5 or lower at the spine or hip 3. 10-year fracture probability by FRAX of at least 20% for a major osteoporotic fracture and 3% for a hip fracture Loura Pardon MD   After informed verbal consent was obtained. Using Betadine for cleansing and 1% Lidocaine with epinephrine for anesthetic (2.5 cc's used). With sterile technique an 18 gauge needle was used to enter the bursa - it was aspirated and 3 cc's of straw-colored fluid (see photo) was obtained and 40 mg of depo-medrol was injected into the bursa. Hemostasis was obtained.  The specimen is labeled and sent for evaluation. The procedure was well tolerated without complications. A dressing and ACE wrap were applied.  Assessment & Plan:   Denise Park was seen today for elbow pain.  Diagnoses and all orders for this visit:  Olecranon bursitis of left elbow- The differential includes gouty arthropathy, traumatic bursitis, inflammatory bursitis, or an infected bursa.  She was given 40 mg of Depo-Medrol into the bursa to reduce the pain and inflammation.  I  have sent off the fluid for identification of any crystals.  I am also concerned that there may be strep infection so I recommended that she take a course of Augmentin.  The fluid has been sent for culture as well as cell count with differential. -     WOUND CULTURE; Future -     Body fluid cell count with differential; Future -     Body Fluid Crystal; Future -     Cell count + diff,  w/ cryst-synvl fld; Future -     Cell count + diff,  w/ cryst-synvl fld -     Body Fluid Crystal -     WOUND CULTURE -     methylPREDNISolone acetate (DEPO-MEDROL) injection 80 mg  Cellulitis of left elbow- The culture returns positive for light growth of staph aureus.  I recommended that she change her antibiotic regimen from Augmentin to sulfamethoxazole/trimethoprim. -     amoxicillin-clavulanate (AUGMENTIN) 875-125 MG tablet; Take 1 tablet by mouth 2 (two) times daily. -     sulfamethoxazole-trimethoprim (BACTRIM DS) 800-160 MG tablet; Take 1 tablet by mouth 2 (two) times daily for 10 days.   I am having Denise Park start on amoxicillin-clavulanate and  sulfamethoxazole-trimethoprim. I am also having her maintain her CALCIUM-VITAMIN D PO, ESTRACE VAGINAL, cetirizine, buPROPion, FLUoxetine, zolpidem, traMADol, ALPRAZolam, meloxicam, busPIRone, Imvexxy Maintenance Pack, and diclofenac Sodium. We administered methylPREDNISolone acetate.  Meds ordered this encounter  Medications  . amoxicillin-clavulanate (AUGMENTIN) 875-125 MG tablet    Sig: Take 1 tablet by mouth 2 (two) times daily.    Dispense:  20 tablet    Refill:  0  . methylPREDNISolone acetate (DEPO-MEDROL) injection 80 mg  . sulfamethoxazole-trimethoprim (BACTRIM DS) 800-160 MG tablet    Sig: Take 1 tablet by mouth 2 (two) times daily for 10 days.    Dispense:  20 tablet    Refill:  0     Follow-up: Return in about 3 days (around 10/03/2020).  Scarlette Calico, MD

## 2020-09-30 NOTE — Patient Instructions (Signed)
Elbow Bursitis ° °Bursitis is swelling and pain at the tip of the elbow. This happens when fluid builds up in a sac under the skin (bursa). This may also be called olecranon bursitis. °What are the causes? °Elbow bursitis may be caused by: °· Elbow injury, such as falling onto the elbow. °· Leaning on hard surfaces for long periods of time. °· Infection from an injury that breaks the skin near the elbow. °· A bone growth (spur) that forms at the tip of the elbow. °· A medical condition that causes inflammation, such as gout or rheumatoid arthritis. °Sometimes the cause is not known. °What are the signs or symptoms? °The first sign of elbow bursitis is usually swelling at the tip of the elbow. This can grow to be about the size of a golf ball. Swelling may start suddenly or develop gradually. Other symptoms may include: °· Pain when bending or leaning on the elbow. °· Not being able to move the elbow normally. °If bursitis is caused by an infection, you may have: °· Redness, warmth, and tenderness of the elbow. °· Drainage of pus from the swollen area over the elbow, if the skin breaks open. °How is this diagnosed? °This condition may be diagnosed based on: °· Your symptoms and medical history. °· Any recent injuries you have had. °· A physical exam. °· X-rays to check for a bone spur or fracture. °· Draining fluid from the bursa to test it for infection. °· Blood tests to rule out gout or rheumatoid arthritis. °How is this treated? °Treatment for elbow bursitis depends on the cause. Treatment may include: °· Medicines. These may include: °? Over-the-counter medicines to relieve pain and inflammation. °? Antibiotic medicines. °? Injections of anti-inflammatory medicines (steroids). °· Draining fluid from the bursa. °· Wrapping your elbow with a bandage. °· Wearing elbow pads. °If these treatments do not help, you may need surgery to remove the bursa. °Follow these instructions at home: °Medicines °· Take  over-the-counter and prescription medicines only as told by your health care provider. °· If you were prescribed an antibiotic medicine, take it as told by your health care provider. Do not stop taking the antibiotic even if you start to feel better. °Managing pain, stiffness, and swelling ° °· If directed, put ice on your elbow: °? Put ice in a plastic bag. °? Place a towel between your skin and the bag. °? Leave the ice on for 20 minutes, 2-3 times a day. °· If your bursitis is caused by an injury, rest your elbow and wear your bandage as told by your health care provider. °· Use elbow pads or elbow wraps to cushion your elbow as needed. °General instructions °· Avoid any activities that cause elbow pain. Ask your health care provider what activities are safe for you. °· Keep all follow-up visits as told by your health care provider. This is important. °Contact a health care provider if you have: °· A fever. °· Symptoms that do not get better with treatment. °· Pain or swelling that: °? Gets worse. °? Goes away and then comes back. °· Pus draining from your elbow. °Get help right away if you have: °· Trouble moving your arm, hand, or fingers. °Summary °· Elbow bursitis is inflammation of the fluid-filled sac (bursa) between the tip of your elbow bone (olecranon) and your skin. °· Treatment for elbow bursitis depends on the cause. It may include medicines to relieve pain and inflammation, antibiotic medicines, and draining fluid from your elbow. °·   Contact a health care provider if your symptoms do not get better with treatment, or if your symptoms go away and then come back. °This information is not intended to replace advice given to you by your health care provider. Make sure you discuss any questions you have with your health care provider. °Document Revised: 11/19/2017 Document Reviewed: 11/16/2017 °Elsevier Patient Education © 2020 [employer] ° °

## 2020-10-01 LAB — BODY FLUID CRYSTAL

## 2020-10-03 ENCOUNTER — Encounter: Payer: Self-pay | Admitting: Internal Medicine

## 2020-10-03 ENCOUNTER — Telehealth: Payer: Self-pay

## 2020-10-03 LAB — SYNOVIAL FLUID ANALYSIS, COMPLETE
Basophils, %: 0 %
Eosinophils-Synovial: 0 % (ref 0–2)
Lymphocytes-Synovial Fld: 24 % (ref 0–74)
Monocyte/Macrophage: 10 % (ref 0–69)
Neutrophil, Synovial: 66 % — ABNORMAL HIGH (ref 0–24)
Synoviocytes, %: 0 % (ref 0–15)
WBC, Synovial: 4154 cells/uL — ABNORMAL HIGH (ref <150)

## 2020-10-03 LAB — WOUND CULTURE

## 2020-10-03 MED ORDER — SULFAMETHOXAZOLE-TRIMETHOPRIM 800-160 MG PO TABS: 1.0000 | ORAL_TABLET | Freq: Two times a day (BID) | ORAL | 0 refills | Status: AC

## 2020-10-03 NOTE — Telephone Encounter (Signed)
Pt notified that the culture taken on 10/11 shows staphyloccocus aureus & that abx.  Pt has questions about what caused the infection.

## 2020-10-03 NOTE — Telephone Encounter (Signed)
During last call, pt inquired about how she got infected. Per Dr Ronnald Ramp, he noted that she had a scratch on her elbow.  Attempted to notify patient of this, but unable to leave message at this time.

## 2020-10-03 NOTE — Telephone Encounter (Signed)
Ok to Dr Ronnald Ramp, please

## 2020-10-03 NOTE — Telephone Encounter (Signed)
CRITICAL VALUE STICKER  CRITICAL VALUE: Aerobic Gram Stain Culture: Rare WBC, no organisms, no epitheleal cells, moderate growth of staph aureus  RECEIVER (on-site recipient of call): Denise Park [facility]  DATE & TIME NOTIFIED: 10/03/20 at 0850  MESSENGER (representative from lab): Shirlean Mylar  MD NOTIFIED: Dr Ronnald Ramp  TIME OF NOTIFICATION: [phone]  RESPONSE: let her know that the clx was positive for staph and I have sent an additional antibiotic RX to her pharmacy to treat staph

## 2020-10-04 NOTE — Telephone Encounter (Signed)
Please see patient message sent 10/14.   Closing this encounter since pt question has been addressed.

## 2020-10-07 ENCOUNTER — Encounter: Payer: Self-pay | Admitting: Internal Medicine

## 2020-10-07 ENCOUNTER — Ambulatory Visit (INDEPENDENT_AMBULATORY_CARE_PROVIDER_SITE_OTHER): Payer: 59 | Admitting: Internal Medicine

## 2020-10-07 ENCOUNTER — Other Ambulatory Visit: Payer: Self-pay

## 2020-10-07 VITALS — BP 122/76 | HR 96 | Temp 98.3°F | Resp 16 | Ht 67.0 in | Wt 156.0 lb

## 2020-10-07 DIAGNOSIS — M7022 Olecranon bursitis, left elbow: Secondary | ICD-10-CM

## 2020-10-07 DIAGNOSIS — L03114 Cellulitis of left upper limb: Secondary | ICD-10-CM | POA: Diagnosis not present

## 2020-10-07 NOTE — Progress Notes (Signed)
Subjective:  Patient ID: Denise Park, female    DOB: January 25, 1956  Age: 64 y.o. MRN: [id number]  CC: Cellulitis  This visit occurred during the SARS-CoV-2 public health emergency.  Safety protocols were in place, including screening questions prior to the visit, additional usage of staff PPE, and extensive cleaning of exam room while observing appropriate contact time as indicated for disinfecting solutions.    HPI Denise Park presents for f/up - She underwent aspiration of a fluid collection over her left elbow a week ago for olecranon bursitis.  The fluid was negative for crystals.  It was positive for white cells and the culture was positive for staph aureus.  She initially took Augmentin but then was switched to Bactrim.  She tells me the area feels much better.  Her only day complaint today is that the tip of the elbow has some itching and peeling.  Outpatient Medications Prior to Visit  Medication Sig Dispense Refill  . ALPRAZolam (XANAX) 0.25 MG tablet Take 1 tablet (0.25 mg total) by mouth 2 (two) times daily as needed for anxiety. 60 tablet 2  . amoxicillin-clavulanate (AUGMENTIN) 875-125 MG tablet Take 1 tablet by mouth 2 (two) times daily. 20 tablet 0  . buPROPion (WELLBUTRIN SR) 100 MG 12 hr tablet Take 1 tablet (100 mg total) by mouth 2 (two) times daily. 180 tablet 3  . busPIRone (BUSPAR) 10 MG tablet TAKE 1 TABLET BY MOUTH THREE TIMES A DAY AS NEEDED 270 tablet 1  . CALCIUM-VITAMIN D PO Take by mouth daily.      . cetirizine (ZYRTEC) 10 MG tablet TAKE 1 TABLET(10 MG) BY MOUTH DAILY 90 tablet 3  . diclofenac Sodium (VOLTAREN) 1 % GEL Apply 2 g topically 4 (four) times daily. 100 g 5  . ESTRACE VAGINAL 0.1 MG/GM vaginal cream INSERT 1 GRAM VAGINALLY TWICE A WEEK 42.5 g 12  . IMVEXXY MAINTENANCE PACK 4 MCG INST Place 1 capsule vaginally 2 (two) times a week.    . meloxicam (MOBIC) 15 MG tablet Take 1 tablet (15 mg total) by mouth daily as needed for pain. 90 tablet 3  .  sulfamethoxazole-trimethoprim (BACTRIM DS) 800-160 MG tablet Take 1 tablet by mouth 2 (two) times daily for 10 days. 20 tablet 0  . traMADol (ULTRAM) 50 MG tablet Take 1 tablet (50 mg total) by mouth every 6 (six) hours as needed. 120 tablet 2  . zolpidem (AMBIEN) 10 MG tablet Take 1 tablet (10 mg total) by mouth at bedtime as needed. 90 tablet 1  . FLUoxetine (PROZAC) 20 MG capsule TAKE 1 CAPSULE BY MOUTH EVERY DAY 90 capsule 3   No facility-administered medications prior to visit.    ROS Review of Systems  Constitutional: Negative for chills and fever.  HENT: Negative.   Eyes: Negative.   Respiratory: Negative for cough.   Cardiovascular: Negative for chest pain.  Gastrointestinal: Negative for abdominal pain and nausea.  Endocrine: Negative.   Genitourinary: Negative.  Negative for difficulty urinating.  Musculoskeletal: Negative for arthralgias.  Skin: Negative for color change and rash.  Hematological: Negative for adenopathy. Does not bruise/bleed easily.  Psychiatric/Behavioral: Negative.     Objective:  BP 122/76   Pulse 96   Temp 98.3 F (36.8 C) (Oral)   Resp 16   Ht 5\' 7"  (1.702 m)   Wt 156 lb (70.8 kg)   SpO2 96%   BMI 24.43 kg/m   BP Readings from Last 3 Encounters:  10/07/20 122/76  09/30/20 126/78  05/29/20 126/72    Wt Readings from Last 3 Encounters:  10/07/20 156 lb (70.8 kg)  09/30/20 161 lb (73 kg)  05/29/20 168 lb (76.2 kg)    Physical Exam Musculoskeletal:     Left elbow: Deformity present. No swelling, effusion or lacerations. Normal range of motion. No tenderness.     Comments: Over the left olecranon process there is mild peeling.  There is no swelling, induration, warmth, streaking, fluctuance, or exudate.  The elbow shows free range of motion.     Lab Results  Component Value Date   WBC 8.0 11/28/2019   HGB 14.3 11/28/2019   HCT 43.4 11/28/2019   PLT 261.0 11/28/2019   GLUCOSE 86 11/28/2019   CHOL 202 (H) 11/28/2019   TRIG  100.0 11/28/2019   HDL 57.40 11/28/2019   LDLCALC 125 (H) 11/28/2019   ALT 13 11/28/2019   AST 19 11/28/2019   NA 139 11/28/2019   K 3.9 11/28/2019   CL 104 11/28/2019   CREATININE 0.97 11/28/2019   BUN 14 11/28/2019   CO2 29 11/28/2019   TSH 2.51 11/28/2019    DG Bone Density  Result Date: 01/15/2019 Date of study: 01/09/19 Exam: DUAL X-RAY ABSORPTIOMETRY (DXA) FOR BONE MINERAL DENSITY (BMD) Instrument: [employer] Provider: PCP Indication: follow up for low BMD Comparison: none (please note that it is not possible to compare data from different instruments) Clinical data: Pt is a 64 y.o. female without previous history of fracture. On calcium and vitamin D. Results:  Lumbar spine L1-L4 Femoral neck (FN) 33% distal radius T-score -2.3 RFN: -1.0 LFN: -1.0 n/a Change in BMD from previous DXA test (%) Up 3.1% Down 3.1% n/a (*) statistically significant Assessment: the BMD is low according to the [facility] classification for osteoporosis (see below). Fracture risk: moderate FRAX score: 10 year major osteoporotic risk: 7.2%. 10 year hip fracture risk: 8.4%. The thresholds for treatment are 20% and 3%, respectively. Comments: the technical quality of the study is good. Evaluation for secondary causes should be considered if clinically indicated. Recommend optimizing calcium (1200 mg/day) and vitamin D (800 IU/day) intake. Followup: Repeat BMD is appropriate after 2 years or after 1-2 years if starting treatment. WHO criteria for diagnosis of osteoporosis in postmenopausal women and in men 94 y/o or older: - normal: T-score -1.0 to + 1.0 - osteopenia/low bone density: T-score between -2.5 and -1.0 - osteoporosis: T-score below -2.5 - severe osteoporosis: T-score below -2.5 with history of fragility fracture Note: although not part of the WHO classification, the presence of a fragility fracture, regardless of the T-score, should be considered diagnostic of osteoporosis, provided other causes for  the fracture have been excluded. Treatment: The National Osteoporosis Foundation recommends that treatment be considered in postmenopausal women and men age 71 or older with: 1. Hip or vertebral (clinical or morphometric) fracture 2. T-score of - 2.5 or lower at the spine or hip 3. 10-year fracture probability by FRAX of at least 20% for a major osteoporotic fracture and 3% for a hip fracture Loura Pardon MD   Component 8 d ago  Source: BODY FLUID   Status: FINAL   Gram Stain: Rare White blood cells seen No organisms seen No epithelial cells seenAbnormal   Isolate 1: Staphylococcus aureusAbnormal   Comment: Moderate growth of Staphylococcus aureus  Resulting Agency [facility]  Susceptibility   Staphylococcus aureus    AEROBIC CULT, GRAM STAIN POSITIVE 1    CIPROFLOXACIN <=0.5  Sensitive    CLINDAMYCIN <=0.25  Sensitive    ERYTHROMYCIN <=0.25  Sensitive    GENTAMICIN <=0.5  Sensitive    LEVOFLOXACIN <=0.12  Sensitive    OXACILLIN <=0.25  Sensitive 1    TETRACYCLINE <=1  Sensitive    TRIMETH/SULFA <=10  Sensitive 2    VANCOMYCIN 1  Sensitive         1 Oxacillin-susceptible staphylococci are        Assessment & Plan:   Magdaline was seen today for cellulitis.  Diagnoses and all orders for this visit:  Olecranon bursitis of left elbow- Improvement noted.  Cellulitis of left elbow- This is resolving with a course of SMX-TMP.  She agrees to complete the regimen as directed.   I have discontinued Charika Huynh's FLUoxetine. I am also having her maintain her CALCIUM-VITAMIN D PO, ESTRACE VAGINAL, cetirizine, buPROPion, zolpidem, traMADol, ALPRAZolam, meloxicam, busPIRone, Imvexxy Maintenance Pack, diclofenac Sodium, amoxicillin-clavulanate, and sulfamethoxazole-trimethoprim.  No orders of the defined types were placed in this encounter.    Follow-up: No follow-ups on file.  Scarlette Calico, MD

## 2020-10-22 ENCOUNTER — Other Ambulatory Visit: Payer: Self-pay | Admitting: Internal Medicine

## 2020-10-22 ENCOUNTER — Telehealth: Payer: Self-pay | Admitting: Internal Medicine

## 2020-10-22 DIAGNOSIS — L03114 Cellulitis of left upper limb: Secondary | ICD-10-CM

## 2020-10-22 MED ORDER — SULFAMETHOXAZOLE-TRIMETHOPRIM 800-160 MG PO TABS: 1.0000 | ORAL_TABLET | Freq: Two times a day (BID) | ORAL | 0 refills | Status: AC

## 2020-10-22 NOTE — Telephone Encounter (Signed)
Pt has been informed.

## 2020-10-22 NOTE — Telephone Encounter (Signed)
Patient saw Dr. Ronnald Ramp for a infection on her elbow on 10/18 and she finished her antibiotic and it is still somewhat puffy and infected and she was wondering if she could get another antibiotic   sulfamethoxazole-trimethoprim   [id number] [address], [place] Phone:  [phone]  Fax:  [phone]

## 2020-11-12 ENCOUNTER — Telehealth (INDEPENDENT_AMBULATORY_CARE_PROVIDER_SITE_OTHER): Payer: 59 | Admitting: Family Medicine

## 2020-11-12 ENCOUNTER — Other Ambulatory Visit: Payer: Self-pay

## 2020-11-12 DIAGNOSIS — R059 Cough, unspecified: Secondary | ICD-10-CM

## 2020-11-12 DIAGNOSIS — R0981 Nasal congestion: Secondary | ICD-10-CM | POA: Diagnosis not present

## 2020-11-12 DIAGNOSIS — R52 Pain, unspecified: Secondary | ICD-10-CM

## 2020-11-12 MED ORDER — BENZONATATE 100 MG PO CAPS: 100.0000 mg | ORAL_CAPSULE | Freq: Three times a day (TID) | ORAL | 0 refills | Status: DC | PRN

## 2020-11-12 NOTE — Progress Notes (Signed)
Virtual Visit via Video Note  I connected with Denise Park  on 11/12/20 at  1:20 PM EST by a video enabled telemedicine application and verified that I am speaking with the correct person using two identifiers.  Location patient: home, [place] Location provider:work or home office Persons participating in the virtual visit: patient, provider  I discussed the limitations of evaluation and management by telemedicine and the availability of in person appointments. The patient expressed understanding and agreed to proceed.   HPI:  Acute telemedicine visit for "flu" like symptoms: -Onset: 4 days ago -Symptoms include: tired, achy, nasal congestion, dry cough -Denies:fever, CP, SOB, NVD -Has tried:nothing -Pertinent past medical history:see below -Pertinent medication allergies: nkda -COVID-19 vaccine status: not vaccinated  ROS: See pertinent positives and negatives per HPI.  Past Medical History:  Diagnosis Date  . ALLERGIC RHINITIS 10/30/2007  . ANEMIA-IRON DEFICIENCY 10/30/2007  . ANXIETY 10/30/2007  . BACK PAIN 02/13/2008  . COMMON MIGRAINE 10/30/2007  . DEPRESSION 10/30/2007  . GERD 10/30/2007  . Hyperlipidemia 09/26/2014  . HYPERSOMNIA 09/11/2008  . INSOMNIA-SLEEP DISORDER-UNSPEC 09/11/2008  . IRRITABLE BOWEL SYNDROME, HX OF 10/30/2007  . OSTEOPENIA 02/13/2008  . Osteopenia 05/22/2011  . PELVIC PAIN, CHRONIC 05/14/2009  . SINUSITIS- ACUTE-NOS 02/13/2008    Past Surgical History:  Procedure Laterality Date  . CESAREAN SECTION    . TUBAL LIGATION       Current Outpatient Medications:  .  ALPRAZolam (XANAX) 0.25 MG tablet, Take 1 tablet (0.25 mg total) by mouth 2 (two) times daily as needed for anxiety., Disp: 60 tablet, Rfl: 2 .  benzonatate (TESSALON PERLES) 100 MG capsule, Take 1 capsule (100 mg total) by mouth 3 (three) times daily as needed., Disp: 20 capsule, Rfl: 0 .  buPROPion (WELLBUTRIN SR) 100 MG 12 hr tablet, Take 1 tablet (100 mg total) by mouth 2 (two) times daily., Disp: 180  tablet, Rfl: 3 .  busPIRone (BUSPAR) 10 MG tablet, TAKE 1 TABLET BY MOUTH THREE TIMES A DAY AS NEEDED, Disp: 270 tablet, Rfl: 1 .  CALCIUM-VITAMIN D PO, Take by mouth daily.  , Disp: , Rfl:  .  cetirizine (ZYRTEC) 10 MG tablet, TAKE 1 TABLET(10 MG) BY MOUTH DAILY, Disp: 90 tablet, Rfl: 3 .  diclofenac Sodium (VOLTAREN) 1 % GEL, Apply 2 g topically 4 (four) times daily., Disp: 100 g, Rfl: 5 .  ESTRACE VAGINAL 0.1 MG/GM vaginal cream, INSERT 1 GRAM VAGINALLY TWICE A WEEK, Disp: 42.5 g, Rfl: 12 .  IMVEXXY MAINTENANCE PACK 4 MCG INST, Place 1 capsule vaginally 2 (two) times a week., Disp: , Rfl:  .  meloxicam (MOBIC) 15 MG tablet, Take 1 tablet (15 mg total) by mouth daily as needed for pain., Disp: 90 tablet, Rfl: 3 .  traMADol (ULTRAM) 50 MG tablet, Take 1 tablet (50 mg total) by mouth every 6 (six) hours as needed., Disp: 120 tablet, Rfl: 2 .  zolpidem (AMBIEN) 10 MG tablet, Take 1 tablet (10 mg total) by mouth at bedtime as needed., Disp: 90 tablet, Rfl: 1  EXAM:  VITALS per patient if applicable:  GENERAL: alert, oriented, appears well and in no acute distress  HEENT: atraumatic, conjunttiva clear, no obvious abnormalities on inspection of external nose and ears  NECK: normal movements of the head and neck  LUNGS: on inspection no signs of respiratory distress, breathing rate appears normal, no obvious gross SOB, gasping or wheezing  CV: no obvious cyanosis  MS: moves all visible extremities without noticeable abnormality  PSYCH/NEURO: pleasant and  cooperative, no obvious depression or anxiety, speech and thought processing grossly intact  ASSESSMENT AND PLAN:  Discussed the following assessment and plan:  Cough  Nasal congestion  Body aches  -we discussed possible serious and likely etiologies, options for evaluation and workup, limitations of telemedicine visit vs in person visit, treatment, treatment risks and precautions. Pt prefers to treat via telemedicine empirically  rather than in person at this moment. Query viral upper respiratory illness, mild influenza, COVID-19 versus other. Given timing/duration of symptoms, we decided that testing for influenza would not likely change her treatment at this point. Advised Covid testing and discussed options. She plans to get a Covid test one of the testing sites near where she lives. Discussed potential complications, precautions, treatment options and home isolation. Provided her with the number for the Covid outpatient treatment center in case she has a positive test. She wrote this down, and I included in her patient instructions. Discussed symptomatic care with analgesics, nasal saline and sent Tessalon Rx for cough. Work/School slipped offered: provided in patient instructions  Scheduled follow up with PCP offered: She agrees to follow-up as needed. Advised to seek prompt in person care if worsening, new symptoms arise, or if is not improving with treatment. Discussed options for inperson care if PCP office not available. Did let this patient know that I only do telemedicine on Tuesdays and Thursdays for [place]. Advised to schedule follow up visit with PCP or [facility] if any further questions or concerns to avoid delays in care.   I discussed the assessment and treatment plan with the patient. The patient was provided an opportunity to ask questions and all were answered. The patient agreed with the plan and demonstrated an understanding of the instructions.     Lucretia Kern, DO

## 2020-11-12 NOTE — Patient Instructions (Addendum)
   ---------------------------------------------------------------------------------------------------------------------------      WORK SLIP:  Patient Denise Park,  02-20-1956, was seen for a medical visit today, 11/12/20 . Please excuse from work according to the [facility] guidelines for a COVID like illness. We advise 10 days minimum from the onset of symptoms (11/08/2020) PLUS 1 day of no fever and improved symptoms. Will defer to employer for a sooner return to work if [place] testing is negative and the symptoms have resolved. Advise following CDC guidelines.  She/He may work remotely from home in self isolation if she is feeling better and wishes to do so.  Sincerely: E-signature: Dr. Colin Benton, DO [place] Ph: [phone]   ------------------------------------------------------------------------------------------------------------------------------    HOME CARE TIPS:  -[place] testing information: https://www.[facility].org/ OR [phone] Most pharmacies also offer testing and home test kits.  -I sent the medication(s) we discussed to your pharmacy: Meds ordered this encounter  Medications  . benzonatate (TESSALON PERLES) 100 MG capsule    Sig: Take 1 capsule (100 mg total) by mouth 3 (three) times daily as needed.    Dispense:  20 capsule    Refill:  0     -COVID19 outpatient treatment center: [phone] (only call if your Covid test is positive and you are interested in monoclonal antibody treatment which is available to those with risk factors within 10 days of symptom onset)  -can use tylenol or aleve if needed for fevers, aches and pains per instructions  -can use nasal saline a few times per day if nasal congestion, sometime a short course of Afrin nasal spray for 3 days can help as well  -stay hydrated, drink plenty of fluids and eat small healthy meals - avoid dairy  -can take 1000 IU Vit  D3 and Vit C lozenges per instructions  -If the Covid test is positive, check out the CDC website for more information on home care, transmission and treatment for COVID19  -follow up with your doctor in 2-3 days unless improving and feeling better  -stay home while sick, except to seek medical care, and if you have COVID19 please stay home for a full 10 days since the onset of symptoms PLUS one day of no fever and feeling better.  It was nice to meet you today, and I really hope you are feeling better soon. I help [facility] out with telemedicine visits on Tuesdays and Thursdays and am available for visits on those days. If you have any concerns or questions following this visit please schedule a follow up visit with your Primary Care doctor or seek care at a local urgent care clinic to avoid delays in care.    Seek in person care promptly if your symptoms worsen, new concerns arise or you are not improving with treatment. Call 911 and/or seek emergency care if you symptoms are severe or life threatening.

## 2020-12-17 ENCOUNTER — Other Ambulatory Visit: Payer: Self-pay | Admitting: Internal Medicine

## 2020-12-25 ENCOUNTER — Telehealth: Payer: Self-pay | Admitting: *Deleted

## 2020-12-25 NOTE — Telephone Encounter (Signed)
Received fax stating Tramadol 50 mg remains covered from 12/23/20- 06/21/21.

## 2021-01-17 ENCOUNTER — Other Ambulatory Visit: Payer: Self-pay

## 2021-01-20 ENCOUNTER — Other Ambulatory Visit: Payer: Self-pay

## 2021-01-20 ENCOUNTER — Encounter: Payer: Self-pay | Admitting: Internal Medicine

## 2021-01-20 ENCOUNTER — Other Ambulatory Visit (INDEPENDENT_AMBULATORY_CARE_PROVIDER_SITE_OTHER): Payer: 59

## 2021-01-20 ENCOUNTER — Ambulatory Visit: Payer: 59 | Admitting: Internal Medicine

## 2021-01-20 VITALS — BP 126/82 | HR 83 | Temp 98.3°F | Ht 67.0 in | Wt 150.0 lb

## 2021-01-20 DIAGNOSIS — E538 Deficiency of other specified B group vitamins: Secondary | ICD-10-CM | POA: Diagnosis not present

## 2021-01-20 DIAGNOSIS — Z0001 Encounter for general adult medical examination with abnormal findings: Secondary | ICD-10-CM

## 2021-01-20 DIAGNOSIS — M7022 Olecranon bursitis, left elbow: Secondary | ICD-10-CM

## 2021-01-20 DIAGNOSIS — E7849 Other hyperlipidemia: Secondary | ICD-10-CM

## 2021-01-20 DIAGNOSIS — E611 Iron deficiency: Secondary | ICD-10-CM

## 2021-01-20 DIAGNOSIS — E559 Vitamin D deficiency, unspecified: Secondary | ICD-10-CM

## 2021-01-20 DIAGNOSIS — D509 Iron deficiency anemia, unspecified: Secondary | ICD-10-CM

## 2021-01-20 DIAGNOSIS — L03114 Cellulitis of left upper limb: Secondary | ICD-10-CM | POA: Diagnosis not present

## 2021-01-20 DIAGNOSIS — R739 Hyperglycemia, unspecified: Secondary | ICD-10-CM

## 2021-01-20 LAB — CBC WITH DIFFERENTIAL/PLATELET
Basophils Absolute: 0.1 10*3/uL (ref 0.0–0.1)
Basophils Relative: 1.7 % (ref 0.0–3.0)
Eosinophils Absolute: 0.1 10*3/uL (ref 0.0–0.7)
Eosinophils Relative: 1.5 % (ref 0.0–5.0)
HCT: 46.7 % — ABNORMAL HIGH (ref 36.0–46.0)
Hemoglobin: 15.7 g/dL — ABNORMAL HIGH (ref 12.0–15.0)
Lymphocytes Relative: 28.2 % (ref 12.0–46.0)
Lymphs Abs: 2.3 10*3/uL (ref 0.7–4.0)
MCHC: 33.6 g/dL (ref 30.0–36.0)
MCV: 88.7 fl (ref 78.0–100.0)
Monocytes Absolute: 0.6 10*3/uL (ref 0.1–1.0)
Monocytes Relative: 7.8 % (ref 3.0–12.0)
Neutro Abs: 5 10*3/uL (ref 1.4–7.7)
Neutrophils Relative %: 60.8 % (ref 43.0–77.0)
Platelets: 298 10*3/uL (ref 150.0–400.0)
RBC: 5.27 Mil/uL — ABNORMAL HIGH (ref 3.87–5.11)
RDW: 13.8 % (ref 11.5–15.5)
WBC: 8.2 10*3/uL (ref 4.0–10.5)

## 2021-01-20 LAB — BASIC METABOLIC PANEL
BUN: 19 mg/dL (ref 6–23)
CO2: 30 mEq/L (ref 19–32)
Calcium: 10.3 mg/dL (ref 8.4–10.5)
Chloride: 105 mEq/L (ref 96–112)
Creatinine, Ser: 0.87 mg/dL (ref 0.40–1.20)
GFR: 71.38 mL/min (ref >60.00)
Glucose, Bld: 100 mg/dL — ABNORMAL HIGH (ref 70–99)
Potassium: 4.1 mEq/L (ref 3.5–5.1)
Sodium: 142 mEq/L (ref 135–145)

## 2021-01-20 LAB — URINALYSIS, ROUTINE W REFLEX MICROSCOPIC
Bilirubin Urine: NEGATIVE
Hgb urine dipstick: NEGATIVE
Leukocytes,Ua: NEGATIVE
Nitrite: NEGATIVE
Specific Gravity, Urine: >=1.03 — AB (ref 1.000–1.030)
Total Protein, Urine: NEGATIVE
Urine Glucose: NEGATIVE
Urobilinogen, UA: 0.2 (ref 0.0–1.0)
pH: 5.5 (ref 5.0–8.0)

## 2021-01-20 LAB — IBC PANEL
Iron: 76 ug/dL (ref 42–145)
Saturation Ratios: 26.4 % (ref 20.0–50.0)
Transferrin: 206 mg/dL — ABNORMAL LOW (ref 212.0–360.0)

## 2021-01-20 LAB — HEPATIC FUNCTION PANEL
ALT: 12 U/L (ref 0–35)
AST: 18 U/L (ref 0–37)
Albumin: 4.6 g/dL (ref 3.5–5.2)
Alkaline Phosphatase: 96 U/L (ref 39–117)
Bilirubin, Direct: 0.1 mg/dL (ref 0.0–0.3)
Total Bilirubin: 0.6 mg/dL (ref 0.2–1.2)
Total Protein: 7.7 g/dL (ref 6.0–8.3)

## 2021-01-20 LAB — LIPID PANEL
Cholesterol: 166 mg/dL (ref 0–200)
HDL: 67.6 mg/dL (ref >39.00)
LDL Cholesterol: 85 mg/dL (ref 0–99)
NonHDL: 98.2
Total CHOL/HDL Ratio: 2
Triglycerides: 64 mg/dL (ref 0.0–149.0)
VLDL: 12.8 mg/dL (ref 0.0–40.0)

## 2021-01-20 LAB — FERRITIN: Ferritin: 75.6 ng/mL (ref 10.0–291.0)

## 2021-01-20 LAB — VITAMIN D 25 HYDROXY (VIT D DEFICIENCY, FRACTURES): VITD: 40.81 ng/mL (ref 30.00–100.00)

## 2021-01-20 LAB — HEMOGLOBIN A1C: Hgb A1c MFr Bld: 5.5 % (ref 4.6–6.5)

## 2021-01-20 LAB — TSH: TSH: 1.01 u[IU]/mL (ref 0.35–4.50)

## 2021-01-20 LAB — VITAMIN B12: Vitamin B-12: 337 pg/mL (ref 211–911)

## 2021-01-20 NOTE — Assessment & Plan Note (Signed)
Age and sex appropriate education and counseling updated with regular exercise and diet Referrals for preventative services - none needed - declines mamogram but will call on her own Immunizations addressed - none needed - declines flu shot Smoking counseling  - none needed Evidence for depression or other mood disorder - none significant Most recent labs reviewed. I have personally reviewed and have noted: 1) the patient's medical and social history 2) The patient's current medications and supplements 3) The patient's height, weight, and BMI have been recorded in the chart

## 2021-01-20 NOTE — Assessment & Plan Note (Signed)
Lab Results  Component Value Date   LDLCALC 85 01/20/2021   Stable, pt to continue current diet control    Current Outpatient Medications (Respiratory):  .  benzonatate (TESSALON PERLES) 100 MG capsule, Take 1 capsule (100 mg total) by mouth 3 (three) times daily as needed. .  cetirizine (ZYRTEC) 10 MG tablet, TAKE 1 TABLET(10 MG) BY MOUTH DAILY  Current Outpatient Medications (Analgesics):  .  meloxicam (MOBIC) 15 MG tablet, TAKE 1 TABLET BY MOUTH EVERY DAY AS NEEDED FOR PAIN .  traMADol (ULTRAM) 50 MG tablet, TAKE 1 TABLET BY MOUTH EVERY 6 HOURS AS NEEDED.   Current Outpatient Medications (Other):  Marland Kitchen  ALPRAZolam (XANAX) 0.25 MG tablet, Take 1 tablet (0.25 mg total) by mouth 2 (two) times daily as needed for anxiety. Marland Kitchen  buPROPion (WELLBUTRIN SR) 100 MG 12 hr tablet, Take 1 tablet (100 mg total) by mouth 2 (two) times daily. .  busPIRone (BUSPAR) 10 MG tablet, TAKE 1 TABLET BY MOUTH THREE TIMES A DAY AS NEEDED .  CALCIUM-VITAMIN D PO, Take by mouth daily. .  diclofenac Sodium (VOLTAREN) 1 % GEL, Apply 2 g topically 4 (four) times daily. Marland Kitchen  ESTRACE VAGINAL 0.1 MG/GM vaginal cream, INSERT 1 GRAM VAGINALLY TWICE A WEEK .  IMVEXXY MAINTENANCE PACK 4 MCG INST, Place 1 capsule vaginally 2 (two) times a week. .  zolpidem (AMBIEN) 10 MG tablet, Take 1 tablet (10 mg total) by mouth at bedtime as needed.

## 2021-01-20 NOTE — Progress Notes (Signed)
Established Patient Office Visit  Subjective:  Patient ID: Denise Park, female    DOB: [birth year]  Age: 65 y.o. MRN: [id number]        Chief Complaint:: wellness exam and Elbow Injury        HPI:  Denise Park is a 65 y.o. female here for wellness exam. Plans to call for her mammogram, declines flu shot   Wt Readings from Last 3 Encounters:  01/20/21 150 lb (68 kg)  10/07/20 156 lb (70.8 kg)  09/30/20 161 lb (73 kg)   BP Readings from Last 3 Encounters:  01/20/21 126/82  10/07/20 122/76  09/30/20 126/78   Immunization History  Administered Date(s) Administered  . Tdap 10/06/2017   There are no preventive care reminders to display for this patient.      Also with c/o slight worsening left elbow red and tender worse again x 3 days, without fever, chills , red streaks or drainage after recent tx with 2 course antibx for staph cellulitis and bursitis.  Works for [employer] and rests the left elbow tip on the door much of the time, tries not to but mostly just keeps doing it.  Nothing really makes better or worse.  No overt trauma  Past Medical History:  Diagnosis Date  . ALLERGIC RHINITIS 10/30/2007  . ANEMIA-IRON DEFICIENCY 10/30/2007  . ANXIETY 10/30/2007  . BACK PAIN 02/13/2008  . COMMON MIGRAINE 10/30/2007  . DEPRESSION 10/30/2007  . GERD 10/30/2007  . Hyperlipidemia 09/26/2014  . HYPERSOMNIA 09/11/2008  . INSOMNIA-SLEEP DISORDER-UNSPEC 09/11/2008  . IRRITABLE BOWEL SYNDROME, HX OF 10/30/2007  . OSTEOPENIA 02/13/2008  . Osteopenia 05/22/2011  . PELVIC PAIN, CHRONIC 05/14/2009  . SINUSITIS- ACUTE-NOS 02/13/2008   Past Surgical History:  Procedure Laterality Date  . CESAREAN SECTION    . TUBAL LIGATION      reports that she has never smoked. She has never used smokeless tobacco. She reports current alcohol use. She reports that she does not use drugs. family history includes Coronary artery disease in an other family member; Depression in an other family member. No Known  Allergies Current Outpatient Medications on File Prior to Visit  Medication Sig Dispense Refill  . ALPRAZolam (XANAX) 0.25 MG tablet Take 1 tablet (0.25 mg total) by mouth 2 (two) times daily as needed for anxiety. 60 tablet 2  . benzonatate (TESSALON PERLES) 100 MG capsule Take 1 capsule (100 mg total) by mouth 3 (three) times daily as needed. 20 capsule 0  . buPROPion (WELLBUTRIN SR) 100 MG 12 hr tablet Take 1 tablet (100 mg total) by mouth 2 (two) times daily. 180 tablet 3  . busPIRone (BUSPAR) 10 MG tablet TAKE 1 TABLET BY MOUTH THREE TIMES A DAY AS NEEDED 270 tablet 1  . CALCIUM-VITAMIN D PO Take by mouth daily.    . cetirizine (ZYRTEC) 10 MG tablet TAKE 1 TABLET(10 MG) BY MOUTH DAILY 90 tablet 3  . diclofenac Sodium (VOLTAREN) 1 % GEL Apply 2 g topically 4 (four) times daily. 100 g 5  . ESTRACE VAGINAL 0.1 MG/GM vaginal cream INSERT 1 GRAM VAGINALLY TWICE A WEEK 42.5 g 12  . IMVEXXY MAINTENANCE PACK 4 MCG INST Place 1 capsule vaginally 2 (two) times a week.    . meloxicam (MOBIC) 15 MG tablet TAKE 1 TABLET BY MOUTH EVERY DAY AS NEEDED FOR PAIN 90 tablet 1  . traMADol (ULTRAM) 50 MG tablet TAKE 1 TABLET BY MOUTH EVERY 6 HOURS AS NEEDED. 120 tablet 2  . zolpidem (  AMBIEN) 10 MG tablet Take 1 tablet (10 mg total) by mouth at bedtime as needed. 90 tablet 1   No current facility-administered medications on file prior to visit.        ROS:  All others reviewed and negative.  Objective        PE:  BP 126/82   Pulse 83   Temp 98.3 F (36.8 C) (Oral)   Ht 5\' 7"  (1.702 m)   Wt 150 lb (68 kg)   SpO2 96%   BMI 23.49 kg/m                 Constitutional: Pt appears in NAD               HENT: Head: NCAT.                Right Ear: External ear normal.                 Left Ear: External ear normal.                Eyes: . Pupils are equal, round, and reactive to light. Conjunctivae and EOM are normal               Nose: without d/c or deformity               Neck: Neck supple. Gross normal  ROM               Cardiovascular: Normal rate and regular rhythm.                 Pulmonary/Chest: Effort normal and breath sounds without rales or wheezing.                Abd:  Soft, NT, ND, + BS, no organomegaly                Left elbow with slight red, swelling tender at the bursa               Neurological: Pt is alert. At baseline orientation, motor grossly intact               Skin: Skin is warm. No rashes, no other new lesions, LE edema - none               Psychiatric: Pt behavior is normal without agitation   Assessment/Plan:  Denise Park is a 65 y.o. White or Caucasian [1] female with  has a past medical history of ALLERGIC RHINITIS (10/30/2007), ANEMIA-IRON DEFICIENCY (10/30/2007), ANXIETY (10/30/2007), BACK PAIN (02/13/2008), COMMON MIGRAINE (10/30/2007), DEPRESSION (10/30/2007), GERD (10/30/2007), Hyperlipidemia (09/26/2014), HYPERSOMNIA (09/11/2008), INSOMNIA-SLEEP DISORDER-UNSPEC (09/11/2008), IRRITABLE BOWEL SYNDROME, HX OF (10/30/2007), OSTEOPENIA (02/13/2008), Osteopenia (05/22/2011), PELVIC PAIN, CHRONIC (05/14/2009), and SINUSITIS- ACUTE-NOS (02/13/2008).  Micro: none  Cardiac tracings I have personally interpreted today:  none  Pertinent Radiological findings (summarize): none   Lab Results  Component Value Date   WBC 8.2 01/20/2021   HGB 15.7 (H) 01/20/2021   HCT 46.7 (H) 01/20/2021   PLT 298.0 01/20/2021   GLUCOSE 100 (H) 01/20/2021   CHOL 166 01/20/2021   TRIG 64.0 01/20/2021   HDL 67.60 01/20/2021   LDLCALC 85 01/20/2021   ALT 12 01/20/2021   AST 18 01/20/2021   NA 142 01/20/2021   K 4.1 01/20/2021   CL 105 01/20/2021   CREATININE 0.87 01/20/2021   BUN 19 01/20/2021   CO2 30 01/20/2021   TSH 1.01 01/20/2021   HGBA1C 5.5 01/20/2021  Assessment & Plan:   Problem List Items Addressed This Visit      High   Encounter for well adult exam with abnormal findings - Primary    Age and sex appropriate education and counseling updated with regular exercise and  diet Referrals for preventative services - none needed - declines mamogram but will call on her own Immunizations addressed - none needed - declines flu shot Smoking counseling  - none needed Evidence for depression or other mood disorder - none significant Most recent labs reviewed. I have personally reviewed and have noted: 1) the patient's medical and social history 2) The patient's current medications and supplements 3) The patient's height, weight, and BMI have been recorded in the chart         Medium   Olecranon bursitis of left elbow    Mild persistent, likely slightly worsening again, pt reassured, for volt gel prn, avoid any pressure to elbow at work, and f/u any worsening s/s      Hyperlipidemia    Lab Results  Component Value Date   LDLCALC 85 01/20/2021   Stable, pt to continue current diet control    Current Outpatient Medications (Respiratory):  .  benzonatate (TESSALON PERLES) 100 MG capsule, Take 1 capsule (100 mg total) by mouth 3 (three) times daily as needed. .  cetirizine (ZYRTEC) 10 MG tablet, TAKE 1 TABLET(10 MG) BY MOUTH DAILY  Current Outpatient Medications (Analgesics):  .  meloxicam (MOBIC) 15 MG tablet, TAKE 1 TABLET BY MOUTH EVERY DAY AS NEEDED FOR PAIN .  traMADol (ULTRAM) 50 MG tablet, TAKE 1 TABLET BY MOUTH EVERY 6 HOURS AS NEEDED.   Current Outpatient Medications (Other):  Marland Kitchen  ALPRAZolam (XANAX) 0.25 MG tablet, Take 1 tablet (0.25 mg total) by mouth 2 (two) times daily as needed for anxiety. Marland Kitchen  buPROPion (WELLBUTRIN SR) 100 MG 12 hr tablet, Take 1 tablet (100 mg total) by mouth 2 (two) times daily. .  busPIRone (BUSPAR) 10 MG tablet, TAKE 1 TABLET BY MOUTH THREE TIMES A DAY AS NEEDED .  CALCIUM-VITAMIN D PO, Take by mouth daily. .  diclofenac Sodium (VOLTAREN) 1 % GEL, Apply 2 g topically 4 (four) times daily. Marland Kitchen  ESTRACE VAGINAL 0.1 MG/GM vaginal cream, INSERT 1 GRAM VAGINALLY TWICE A WEEK .  IMVEXXY MAINTENANCE PACK 4 MCG INST, Place 1  capsule vaginally 2 (two) times a week. .  zolpidem (AMBIEN) 10 MG tablet, Take 1 tablet (10 mg total) by mouth at bedtime as needed.       Relevant Orders   Lipid panel (Completed)   Hepatic function panel (Completed)   CBC with Differential/Platelet (Completed)   TSH (Completed)   Urinalysis, Routine w reflex microscopic (Completed)   Basic metabolic panel (Completed)   Cellulitis of left elbow    Resolved, ok to follow for now      [facility]    Also for iron lab f/u, no overt bleeding       Other Visit Diagnoses    Hyperglycemia       Relevant Orders   Hemoglobin A1c (Completed)   B12 deficiency       Relevant Orders   Vitamin B12 (Completed)   Iron deficiency       Relevant Orders   IBC panel (Completed)   Ferritin (Completed)   Vitamin D deficiency       Relevant Orders   VITAMIN D 25 Hydroxy (Vit-D Deficiency, Fractures) (Completed)      No orders of  the defined types were placed in this encounter.   Follow-up: Return in about 6 months (around 07/20/2021).   Cathlean Cower, MD 01/20/2021 10:05 AM [facility]

## 2021-01-20 NOTE — Patient Instructions (Signed)
Ok to try the otc voltaren gel as needed  Please continue all other medications as before, and refills have been done if requested.  Please have the pharmacy call with any other refills you may need.  Please continue your efforts at being more active, low cholesterol diet, and weight control.  You are otherwise up to date with prevention measures today.  Please keep your appointments with your specialists as you may have planned  Please go to the LAB at the blood drawing area for the tests to be done - at the [place] will be contacted by phone if any changes need to be made immediately.  Otherwise, you will receive a letter about your results with an explanation, but please check with MyChart first.  Please remember to sign up for MyChart if you have not done so, as this will be important to you in the future with finding out test results, communicating by private email, and scheduling acute appointments online when needed.  Please make an Appointment to return in 6 months, or sooner if needed

## 2021-01-20 NOTE — Assessment & Plan Note (Signed)
Resolved, ok to follow for now

## 2021-01-20 NOTE — Assessment & Plan Note (Signed)
Mild persistent, likely slightly worsening again, pt reassured, for volt gel prn, avoid any pressure to elbow at work, and f/u any worsening s/s

## 2021-01-20 NOTE — Assessment & Plan Note (Signed)
Also for iron lab f/u, no overt bleeding

## 2021-01-21 ENCOUNTER — Encounter: Payer: Self-pay | Admitting: Internal Medicine

## 2021-03-07 ENCOUNTER — Telehealth: Payer: 59 | Admitting: Family

## 2021-03-12 ENCOUNTER — Ambulatory Visit: Payer: 59 | Admitting: Internal Medicine

## 2021-04-22 ENCOUNTER — Other Ambulatory Visit: Payer: Self-pay | Admitting: Internal Medicine

## 2021-08-07 ENCOUNTER — Telehealth: Payer: Self-pay | Admitting: Emergency Medicine

## 2021-08-07 NOTE — Telephone Encounter (Signed)
Pt called and needs a refill on her cetirizine (ZYRTEC) 10 MG tablet. Pharmacy is [facility] [address]. Thanks.

## 2021-08-07 NOTE — Telephone Encounter (Signed)
Prescription has not been written since 2019.  Pt also due OV since last OV was 12/2020 & MD recommended 7mof/u. LVM at both numbers on file instructing pt to call back to schedule appt.

## 2021-08-12 ENCOUNTER — Encounter: Payer: Self-pay | Admitting: Internal Medicine

## 2021-09-29 ENCOUNTER — Other Ambulatory Visit: Payer: Self-pay

## 2021-09-29 ENCOUNTER — Encounter: Payer: Self-pay | Admitting: Internal Medicine

## 2021-09-29 ENCOUNTER — Telehealth: Payer: Self-pay

## 2021-09-29 ENCOUNTER — Ambulatory Visit (INDEPENDENT_AMBULATORY_CARE_PROVIDER_SITE_OTHER): Payer: 59 | Admitting: Internal Medicine

## 2021-09-29 ENCOUNTER — Ambulatory Visit (INDEPENDENT_AMBULATORY_CARE_PROVIDER_SITE_OTHER): Payer: 59

## 2021-09-29 VITALS — BP 118/70 | HR 82 | Temp 97.8°F | Ht 67.0 in | Wt 151.0 lb

## 2021-09-29 DIAGNOSIS — M545 Low back pain, unspecified: Secondary | ICD-10-CM

## 2021-09-29 DIAGNOSIS — H6992 Unspecified Eustachian tube disorder, left ear: Secondary | ICD-10-CM | POA: Diagnosis not present

## 2021-09-29 DIAGNOSIS — N390 Urinary tract infection, site not specified: Secondary | ICD-10-CM | POA: Diagnosis not present

## 2021-09-29 DIAGNOSIS — J309 Allergic rhinitis, unspecified: Secondary | ICD-10-CM

## 2021-09-29 LAB — HEPATIC FUNCTION PANEL
ALT: 12 U/L (ref 0–35)
AST: 18 U/L (ref 0–37)
Albumin: 4.3 g/dL (ref 3.5–5.2)
Alkaline Phosphatase: 86 U/L (ref 39–117)
Bilirubin, Direct: 0.1 mg/dL (ref 0.0–0.3)
Total Bilirubin: 0.4 mg/dL (ref 0.2–1.2)
Total Protein: 7 g/dL (ref 6.0–8.3)

## 2021-09-29 LAB — CBC WITH DIFFERENTIAL/PLATELET
Basophils Absolute: 0.1 10*3/uL (ref 0.0–0.1)
Basophils Relative: 1.1 % (ref 0.0–3.0)
Eosinophils Absolute: 0.2 10*3/uL (ref 0.0–0.7)
Eosinophils Relative: 3.1 % (ref 0.0–5.0)
HCT: 42.8 % (ref 36.0–46.0)
Hemoglobin: 14.2 g/dL (ref 12.0–15.0)
Lymphocytes Relative: 31.1 % (ref 12.0–46.0)
Lymphs Abs: 1.8 10*3/uL (ref 0.7–4.0)
MCHC: 33.1 g/dL (ref 30.0–36.0)
MCV: 89.7 fl (ref 78.0–100.0)
Monocytes Absolute: 0.5 10*3/uL (ref 0.1–1.0)
Monocytes Relative: 8.4 % (ref 3.0–12.0)
Neutro Abs: 3.3 10*3/uL (ref 1.4–7.7)
Neutrophils Relative %: 56.3 % (ref 43.0–77.0)
Platelets: 245 10*3/uL (ref 150.0–400.0)
RBC: 4.77 Mil/uL (ref 3.87–5.11)
RDW: 13.2 % (ref 11.5–15.5)
WBC: 5.8 10*3/uL (ref 4.0–10.5)

## 2021-09-29 LAB — BASIC METABOLIC PANEL
BUN: 16 mg/dL (ref 6–23)
CO2: 30 mEq/L (ref 19–32)
Calcium: 9.8 mg/dL (ref 8.4–10.5)
Chloride: 104 mEq/L (ref 96–112)
Creatinine, Ser: 1.23 mg/dL — ABNORMAL HIGH (ref 0.40–1.20)
GFR: 46.88 mL/min — ABNORMAL LOW (ref >60.00)
Glucose, Bld: 83 mg/dL (ref 70–99)
Potassium: 4.4 mEq/L (ref 3.5–5.1)
Sodium: 139 mEq/L (ref 135–145)

## 2021-09-29 MED ORDER — TRIAMCINOLONE ACETONIDE 55 MCG/ACT NA AERO: 2.0000 | INHALATION_SPRAY | Freq: Every day | NASAL | 12 refills | Status: DC

## 2021-09-29 MED ORDER — CYCLOBENZAPRINE HCL 5 MG PO TABS: 5.0000 mg | ORAL_TABLET | Freq: Three times a day (TID) | ORAL | 1 refills | Status: DC | PRN

## 2021-09-29 MED ORDER — PREDNISONE 10 MG PO TABS: ORAL_TABLET | ORAL | 0 refills | Status: DC

## 2021-09-29 MED ORDER — GUAIFENESIN ER 600 MG PO TB12: 1200.0000 mg | ORAL_TABLET | Freq: Two times a day (BID) | ORAL | 1 refills | Status: DC | PRN

## 2021-09-29 MED ORDER — TRAMADOL HCL 50 MG PO TABS: 50.0000 mg | ORAL_TABLET | Freq: Four times a day (QID) | ORAL | 1 refills | Status: DC | PRN

## 2021-09-29 NOTE — Telephone Encounter (Signed)
Patient stating she needs a prior auth completed asap for her tramadol.

## 2021-09-29 NOTE — Progress Notes (Signed)
Patient ID: Denise Park, female   DOB: 08/10/1956, 65 y.o.   MRN: [id number]        Chief Complaint: follow up recent uti, persistent right lower back pain, allergies and left ear discomfort       HPI:  Denise Park is a 65 y.o. female here with in f//u recent UTI now mid antibx course and improved dysuria and frequency tx in UC, but still has persistent right lower back pain that is not improved wondering what is going wrong, has mld to mod, radiation to right buttock area only and no RLE other pain, numbness, weakness, giat change or falls.  Also Does have several wks ongoing nasal allergy symptoms with clearish congestion, itch and sneezing, without fever, pain, ST, cough, swelling or wheezing except for left ear muffled hearing and popping and congestion.   Pt denies fever, wt loss, night sweats, loss of appetite, or other constitutional symptoms  Pt denies chest pain, increased sob or doe, wheezing, orthopnea, PND, increased LE swelling, palpitations, dizziness or syncope.   Pt denies polydipsia, polyuria, or new focal neuro s/s,       Wt Readings from Last 3 Encounters:  09/29/21 151 lb (68.5 kg)  01/20/21 150 lb (68 kg)  10/07/20 156 lb (70.8 kg)   BP Readings from Last 3 Encounters:  09/29/21 118/70  01/20/21 126/82  10/07/20 122/76         Past Medical History:  Diagnosis Date   ALLERGIC RHINITIS 10/30/2007   ANEMIA-IRON DEFICIENCY 10/30/2007   ANXIETY 10/30/2007   BACK PAIN 02/13/2008   COMMON MIGRAINE 10/30/2007   DEPRESSION 10/30/2007   GERD 10/30/2007   Hyperlipidemia 09/26/2014   HYPERSOMNIA 09/11/2008   INSOMNIA-SLEEP DISORDER-UNSPEC 09/11/2008   IRRITABLE BOWEL SYNDROME, HX OF 10/30/2007   OSTEOPENIA 02/13/2008   Osteopenia 05/22/2011   PELVIC PAIN, CHRONIC 05/14/2009   SINUSITIS- ACUTE-NOS 02/13/2008   Past Surgical History:  Procedure Laterality Date   CESAREAN SECTION     TUBAL LIGATION      reports that she has never smoked. She has never used smokeless tobacco. She  reports current alcohol use. She reports that she does not use drugs. family history includes Coronary artery disease in an other family member; Depression in an other family member. No Known Allergies Current Outpatient Medications on File Prior to Visit  Medication Sig Dispense Refill   ALPRAZolam (XANAX) 0.25 MG tablet Take 1 tablet (0.25 mg total) by mouth 2 (two) times daily as needed for anxiety. 60 tablet 2   buPROPion (WELLBUTRIN SR) 100 MG 12 hr tablet TAKE 1 TABLET BY MOUTH TWICE A DAY 180 tablet 2   busPIRone (BUSPAR) 10 MG tablet TAKE 1 TABLET BY MOUTH THREE TIMES A DAY AS NEEDED 270 tablet 1   CALCIUM-VITAMIN D PO Take by mouth daily.     cetirizine (ZYRTEC) 10 MG tablet TAKE 1 TABLET(10 MG) BY MOUTH DAILY 90 tablet 3   diclofenac Sodium (VOLTAREN) 1 % GEL Apply 2 g topically 4 (four) times daily. 100 g 5   IMVEXXY MAINTENANCE PACK 4 MCG INST Place 1 capsule vaginally 2 (two) times a week.     meloxicam (MOBIC) 15 MG tablet TAKE 1 TABLET BY MOUTH EVERY DAY AS NEEDED FOR PAIN 90 tablet 2   zolpidem (AMBIEN) 10 MG tablet TAKE 1 TABLET BY MOUTH AT BEDTIME AS NEEDED. 90 tablet 1   benzonatate (TESSALON PERLES) 100 MG capsule Take 1 capsule (100 mg total) by mouth 3 (three) times daily as  needed. (Patient not taking: Reported on 09/29/2021) 20 capsule 0   ESTRACE VAGINAL 0.1 MG/GM vaginal cream INSERT 1 GRAM VAGINALLY TWICE A WEEK (Patient not taking: Reported on 09/29/2021) 42.5 g 12   No current facility-administered medications on file prior to visit.        ROS:  All others reviewed and negative.  Objective        PE:  BP 118/70 (BP Location: Left Arm, Patient Position: Sitting, Cuff Size: Normal)   Pulse 82   Temp 97.8 F (36.6 C) (Oral)   Ht 5\' 7"  (1.702 m)   Wt 151 lb (68.5 kg)   SpO2 97%   BMI 23.65 kg/m                 Constitutional: Pt appears in NAD               HENT: Head: NCAT.                Right Ear: External ear normal.                 Left Ear: External  ear normal.                Eyes: . Pupils are equal, round, and reactive to light. Conjunctivae and EOM are normal               Nose: without d/c or deformity               Neck: Neck supple. Gross normal ROM               Cardiovascular: Normal rate and regular rhythm.                 Pulmonary/Chest: Effort normal and breath sounds without rales or wheezing.                Abd:  Soft, NT, ND, + BS, no organomegaly               Neurological: Pt is alert. At baseline orientation, motor grossly intact               Skin: Skin is warm. No rashes, no other new lesions, LE edema - none                Spine nontender in midline but has mild right lumbar low paravertebral tedner without rash or swelling               Psychiatric: Pt behavior is normal without agitation   Micro: none  Cardiac tracings I have personally interpreted today:  none  Pertinent Radiological findings (summarize): none   Lab Results  Component Value Date   WBC 5.8 09/29/2021   HGB 14.2 09/29/2021   HCT 42.8 09/29/2021   PLT 245.0 09/29/2021   GLUCOSE 83 09/29/2021   CHOL 166 01/20/2021   TRIG 64.0 01/20/2021   HDL 67.60 01/20/2021   LDLCALC 85 01/20/2021   ALT 12 09/29/2021   AST 18 09/29/2021   NA 139 09/29/2021   K 4.4 09/29/2021   CL 104 09/29/2021   CREATININE 1.23 (H) 09/29/2021   BUN 16 09/29/2021   CO2 30 09/29/2021   TSH 1.01 01/20/2021   HGBA1C 5.5 01/20/2021   Assessment/Plan:  Denise Park is a 65 y.o. White or Caucasian [1] female with  has a past medical history of ALLERGIC RHINITIS (10/30/2007), ANEMIA-IRON DEFICIENCY (10/30/2007), ANXIETY (10/30/2007), BACK PAIN (02/13/2008), COMMON MIGRAINE (  10/30/2007), DEPRESSION (10/30/2007), GERD (10/30/2007), Hyperlipidemia (09/26/2014), HYPERSOMNIA (09/11/2008), INSOMNIA-SLEEP DISORDER-UNSPEC (09/11/2008), IRRITABLE BOWEL SYNDROME, HX OF (10/30/2007), OSTEOPENIA (02/13/2008), Osteopenia (05/22/2011), PELVIC PAIN, CHRONIC (05/14/2009), and SINUSITIS- ACUTE-NOS  (02/13/2008).  UTI (urinary tract infection) Symptomatically improved, to finish antbx as rx  Lower back pain C/w likely right lumbar radiculitis - for flexeril 5 prn, predpac asd, tramadol prn, and film to document likely undelrying lumbar DDD  Allergic rhinitis Mild to mod flare, cont zyrtec prnm add nasacort asd ,  to f/u any worsening symptoms or concerns  Eustachian tube disorder, left Also for mucinex prn,  to f/u any worsening symptoms or concerns  Followup: Return in about 6 months (around 03/30/2022), or if symptoms worsen or fail to improve.  Cathlean Cower, MD 10/01/2021 10:36 PM [facility]

## 2021-09-29 NOTE — Patient Instructions (Addendum)
Ok to please finish your antibiotic  Ok to continue the cyclobenzaprine at lower dose 5 mg as needed  Please take all new medication as prescribed - the tramadol, and prednisone  Please take all new medication as prescribed - also the nasacort for allergies, and mucinex for the left ear clogging  Please continue all other medications as before, including the ceterizine (zyrtec)  Please have the pharmacy call with any other refills you may need.  Please continue your efforts at being more active, low cholesterol diet, and weight control.  Please keep your appointments with your specialists as you may have planned  Please go to the XRAY Department in the first floor for the x-ray testing  Please go to the LAB at the blood drawing area for the tests to be done  You will be contacted by phone if any changes need to be made immediately.  Otherwise, you will receive a letter about your results with an explanation, but please check with MyChart first.  Please remember to sign up for MyChart if you have not done so, as this will be important to you in the future with finding out test results, communicating by private email, and scheduling acute appointments online when needed.

## 2021-09-30 ENCOUNTER — Encounter: Payer: Self-pay | Admitting: Internal Medicine

## 2021-09-30 NOTE — Telephone Encounter (Signed)
PA submitted to plan. Key: [id number]

## 2021-10-01 ENCOUNTER — Encounter: Payer: Self-pay | Admitting: Internal Medicine

## 2021-10-01 DIAGNOSIS — H6992 Unspecified Eustachian tube disorder, left ear: Secondary | ICD-10-CM | POA: Insufficient documentation

## 2021-10-01 DIAGNOSIS — N390 Urinary tract infection, site not specified: Secondary | ICD-10-CM | POA: Insufficient documentation

## 2021-10-01 NOTE — Assessment & Plan Note (Signed)
Also for mucinex prn,  to f/u any worsening symptoms or concerns

## 2021-10-01 NOTE — Assessment & Plan Note (Signed)
Mild to mod flare, cont zyrtec prnm add nasacort asd ,  to f/u any worsening symptoms or concerns

## 2021-10-01 NOTE — Assessment & Plan Note (Signed)
Symptomatically improved, to finish antbx as rx

## 2021-10-01 NOTE — Assessment & Plan Note (Signed)
C/w likely right lumbar radiculitis - for flexeril 5 prn, predpac asd, tramadol prn, and film to document likely undelrying lumbar DDD

## 2021-10-02 MED ORDER — CETIRIZINE HCL 10 MG PO TABS: ORAL_TABLET | ORAL | 3 refills | Status: AC

## 2021-12-01 DIAGNOSIS — J22 Unspecified acute lower respiratory infection: Secondary | ICD-10-CM | POA: Insufficient documentation

## 2021-12-09 ENCOUNTER — Telehealth: Payer: Self-pay

## 2021-12-09 NOTE — Telephone Encounter (Signed)
Well, we try to avoid decongestants ike sudafed since they can raise the BP, but Mucinex or Mucinex DM would be fine.

## 2021-12-09 NOTE — Telephone Encounter (Signed)
Doxycycline has been rx'd by urgent care and pt has stated she is on day 8 of the 10 day tx.  Pt states she still feels likes ears are clogged and some congestion.  **Pt wants to know what OTC can she take to help her with her congestion?

## 2021-12-10 NOTE — Telephone Encounter (Signed)
Patient has been notified and verbalizes understanding ?

## 2022-02-13 ENCOUNTER — Encounter: Payer: Self-pay | Admitting: Internal Medicine

## 2022-02-13 ENCOUNTER — Other Ambulatory Visit: Payer: Self-pay

## 2022-02-13 ENCOUNTER — Ambulatory Visit: Payer: 59 | Admitting: Internal Medicine

## 2022-02-13 VITALS — BP 118/70 | HR 97 | Ht 67.0 in | Wt 153.0 lb

## 2022-02-13 DIAGNOSIS — M810 Age-related osteoporosis without current pathological fracture: Secondary | ICD-10-CM

## 2022-02-13 DIAGNOSIS — G8929 Other chronic pain: Secondary | ICD-10-CM

## 2022-02-13 DIAGNOSIS — M545 Low back pain, unspecified: Secondary | ICD-10-CM | POA: Diagnosis not present

## 2022-02-13 DIAGNOSIS — M519 Unspecified thoracic, thoracolumbar and lumbosacral intervertebral disc disorder: Secondary | ICD-10-CM

## 2022-02-13 DIAGNOSIS — J309 Allergic rhinitis, unspecified: Secondary | ICD-10-CM

## 2022-02-13 MED ORDER — TRAMADOL HCL 50 MG PO TABS: 50.0000 mg | ORAL_TABLET | Freq: Four times a day (QID) | ORAL | 0 refills | Status: DC | PRN

## 2022-02-13 MED ORDER — TRAMADOL HCL 50 MG PO TABS: 50.0000 mg | ORAL_TABLET | Freq: Four times a day (QID) | ORAL | 3 refills | Status: DC | PRN

## 2022-02-13 NOTE — Progress Notes (Signed)
Patient ID: Denise Park, female   DOB: 06/19/1956, 66 y.o.   MRN: [id number]        Chief Complaint: follow up acute on chronic lbp, osteopenia, allergies       HPI:  Denise Park is a 66 y.o. female here with c/o known hx of lumbar DDD with last plain films oct 2022 with progressive disase compared to most recent MRI may 2016, now Pt continues to have 2 months acute on chronic LBP across the lower back, but no bowel or bladder change, fever, wt loss,  worsening LE pain/numbness/weakness, gait change or falls.  Gradually worseniing, Pain now 8-9 /10  also due for DXA f/u bone loss after alst dxa jan 2020 with lowest t score -2.3.  Pt denies chest pain, increased sob or doe, wheezing, orthopnea, PND, increased LE swelling, palpitations, dizziness or syncope.  Pt denies new neurological symptoms such as new headache, or facial or extremity weakness or numbness   Pt denies fever, wt loss, night sweats, loss of appetite, or other constitutional symptoms Asks for refill tramadol.  Does have several wks ongoing nasal allergy symptoms with clearish congestion, itch and sneezing, without fever, pain, ST, cough, swelling or wheezing.       Wt Readings from Last 3 Encounters:  02/13/22 153 lb (69.4 kg)  09/29/21 151 lb (68.5 kg)  01/20/21 150 lb (68 kg)   BP Readings from Last 3 Encounters:  02/13/22 118/70  09/29/21 118/70  01/20/21 126/82         Past Medical History:  Diagnosis Date   ALLERGIC RHINITIS 10/30/2007   ANEMIA-IRON DEFICIENCY 10/30/2007   ANXIETY 10/30/2007   BACK PAIN 02/13/2008   COMMON MIGRAINE 10/30/2007   DEPRESSION 10/30/2007   GERD 10/30/2007   Hyperlipidemia 09/26/2014   HYPERSOMNIA 09/11/2008   INSOMNIA-SLEEP DISORDER-UNSPEC 09/11/2008   IRRITABLE BOWEL SYNDROME, HX OF 10/30/2007   OSTEOPENIA 02/13/2008   Osteopenia 05/22/2011   PELVIC PAIN, CHRONIC 05/14/2009   SINUSITIS- ACUTE-NOS 02/13/2008   Past Surgical History:  Procedure Laterality Date   CESAREAN SECTION     TUBAL  LIGATION      reports that she has never smoked. She has never used smokeless tobacco. She reports current alcohol use. She reports that she does not use drugs. family history includes Coronary artery disease in an other family member; Depression in an other family member. No Known Allergies Current Outpatient Medications on File Prior to Visit  Medication Sig Dispense Refill   ALPRAZolam (XANAX) 0.25 MG tablet Take 1 tablet (0.25 mg total) by mouth 2 (two) times daily as needed for anxiety. 60 tablet 2   buPROPion (WELLBUTRIN SR) 100 MG 12 hr tablet TAKE 1 TABLET BY MOUTH TWICE A DAY 180 tablet 2   busPIRone (BUSPAR) 10 MG tablet TAKE 1 TABLET BY MOUTH THREE TIMES A DAY AS NEEDED 270 tablet 1   CALCIUM-VITAMIN D PO Take by mouth daily.     cetirizine (ZYRTEC) 10 MG tablet TAKE 1 TABLET(10 MG) BY MOUTH DAILY 90 tablet 3   cyclobenzaprine (FLEXERIL) 5 MG tablet Take 1 tablet (5 mg total) by mouth 3 (three) times daily as needed for muscle spasms. 40 tablet 1   diclofenac Sodium (VOLTAREN) 1 % GEL Apply 2 g topically 4 (four) times daily. 100 g 5   guaiFENesin (MUCINEX) 600 MG 12 hr tablet Take 2 tablets (1,200 mg total) by mouth 2 (two) times daily as needed. 60 tablet 1   IMVEXXY MAINTENANCE PACK 4 MCG INST Place  1 capsule vaginally 2 (two) times a week.     meloxicam (MOBIC) 15 MG tablet TAKE 1 TABLET BY MOUTH EVERY DAY AS NEEDED FOR PAIN 90 tablet 2   triamcinolone (NASACORT) 55 MCG/ACT AERO nasal inhaler Place 2 sprays into the nose daily. 1 each 12   zolpidem (AMBIEN) 10 MG tablet TAKE 1 TABLET BY MOUTH AT BEDTIME AS NEEDED. 90 tablet 1   No current facility-administered medications on file prior to visit.        ROS:  All others reviewed and negative.  Objective        PE:  BP 118/70 (BP Location: Right Arm, Patient Position: Sitting, Cuff Size: Large)    Pulse 97    Ht 5\' 7"  (1.702 m)    Wt 153 lb (69.4 kg)    SpO2 95%    BMI 23.96 kg/m                 Constitutional: Pt appears in  NAD               HENT: Head: NCAT.                Right Ear: External ear normal.                 Left Ear: External ear normal.                Eyes: . Pupils are equal, round, and reactive to light. Conjunctivae and EOM are normal               Nose: without d/c or deformity               Neck: Neck supple. Gross normal ROM               Cardiovascular: Normal rate and regular rhythm.                 Pulmonary/Chest: Effort normal and breath sounds without rales or wheezing.                Abd:  Soft, NT, ND, + BS, no organomegaly               Neurological: Pt is alert. At baseline orientation, motor grossly intact; spine tender low lumbar in midline, no rash               Skin: Skin is warm. No rashes, no other new lesions, LE edema - none               Psychiatric: Pt behavior is normal without agitation , mild nervous  Micro: none  Cardiac tracings I have personally interpreted today:  none  Pertinent Radiological findings (summarize): none   Lab Results  Component Value Date   WBC 5.8 09/29/2021   HGB 14.2 09/29/2021   HCT 42.8 09/29/2021   PLT 245.0 09/29/2021   GLUCOSE 83 09/29/2021   CHOL 166 01/20/2021   TRIG 64.0 01/20/2021   HDL 67.60 01/20/2021   LDLCALC 85 01/20/2021   ALT 12 09/29/2021   AST 18 09/29/2021   NA 139 09/29/2021   K 4.4 09/29/2021   CL 104 09/29/2021   CREATININE 1.23 (H) 09/29/2021   BUN 16 09/29/2021   CO2 30 09/29/2021   TSH 1.01 01/20/2021   HGBA1C 5.5 01/20/2021   Assessment/Plan:  Denise Park is a 66 y.o. White or Caucasian [1] female with  has a past medical history of ALLERGIC RHINITIS (  10/30/2007), ANEMIA-IRON DEFICIENCY (10/30/2007), ANXIETY (10/30/2007), BACK PAIN (02/13/2008), COMMON MIGRAINE (10/30/2007), DEPRESSION (10/30/2007), GERD (10/30/2007), Hyperlipidemia (09/26/2014), HYPERSOMNIA (09/11/2008), INSOMNIA-SLEEP DISORDER-UNSPEC (09/11/2008), IRRITABLE BOWEL SYNDROME, HX OF (10/30/2007), OSTEOPENIA (02/13/2008), Osteopenia (05/22/2011),  PELVIC PAIN, CHRONIC (05/14/2009), and SINUSITIS- ACUTE-NOS (02/13/2008).  Lower back pain With acute on chronic pain but no new neuro changes, for LS spine MRI and refer ortho Dr Nelva Bush for likely cortisone, and tramadol prn  Osteoporosis Also for DXA f/u,  to f/u any worsening symptoms or concerns  Allergic rhinitis Mild to mod, for zyrtec prn restart,  to f/u any worsening symptoms or concerns  Followup: Return in about 3 months (around 05/13/2022).  Cathlean Cower, MD 02/14/2022 3:09 PM [facility]

## 2022-02-13 NOTE — Patient Instructions (Addendum)
Please take all new medication as prescribed - the tramadol as we discussed  Please continue all other medications as before, and refills have been done if requested.  Please have the pharmacy call with any other refills you may need.  Please continue your efforts at being more active, low cholesterol diet, and weight control  Please keep your appointments with your specialists as you may have planned  Please schedule the bone density test before leaving today at the scheduling desk (where you check out)  You will be contacted regarding the referral for: MRI, and Dr Nelva Bush  Please make an Appointment to return in 3 months, or sooner if needed

## 2022-02-14 ENCOUNTER — Encounter: Payer: Self-pay | Admitting: Internal Medicine

## 2022-02-14 NOTE — Assessment & Plan Note (Signed)
Also for DXA f/u,  to f/u any worsening symptoms or concerns

## 2022-02-14 NOTE — Assessment & Plan Note (Signed)
With acute on chronic pain but no new neuro changes, for LS spine MRI and refer ortho Dr Nelva Bush for likely cortisone, and tramadol prn

## 2022-02-14 NOTE — Assessment & Plan Note (Signed)
Mild to mod, for zyrtec prn restart,  to f/u any worsening symptoms or concerns

## 2022-02-16 ENCOUNTER — Other Ambulatory Visit: Payer: Self-pay | Admitting: Radiology

## 2022-03-06 ENCOUNTER — Other Ambulatory Visit: Payer: Self-pay | Admitting: Internal Medicine

## 2022-03-09 ENCOUNTER — Ambulatory Visit (INDEPENDENT_AMBULATORY_CARE_PROVIDER_SITE_OTHER)
Admission: RE | Admit: 2022-03-09 | Discharge: 2022-03-09 | Disposition: A | Discharge status: Home / Self Care | Payer: 59 | Source: Ambulatory Visit | Attending: Internal Medicine | Admitting: Internal Medicine

## 2022-03-09 ENCOUNTER — Other Ambulatory Visit: Payer: Self-pay

## 2022-03-09 ENCOUNTER — Ambulatory Visit
Admission: RE | Admit: 2022-03-09 | Discharge: 2022-03-09 | Disposition: A | Discharge status: Home / Self Care | Payer: 59 | Source: Ambulatory Visit | Attending: Internal Medicine | Admitting: Internal Medicine

## 2022-03-09 DIAGNOSIS — M810 Age-related osteoporosis without current pathological fracture: Secondary | ICD-10-CM | POA: Diagnosis not present

## 2022-03-09 DIAGNOSIS — M545 Low back pain, unspecified: Secondary | ICD-10-CM

## 2022-03-09 DIAGNOSIS — M519 Unspecified thoracic, thoracolumbar and lumbosacral intervertebral disc disorder: Secondary | ICD-10-CM

## 2022-03-13 ENCOUNTER — Ambulatory Visit (INDEPENDENT_AMBULATORY_CARE_PROVIDER_SITE_OTHER): Payer: 59

## 2022-03-13 ENCOUNTER — Other Ambulatory Visit: Payer: Self-pay

## 2022-03-13 DIAGNOSIS — M810 Age-related osteoporosis without current pathological fracture: Secondary | ICD-10-CM

## 2022-03-13 MED ORDER — DENOSUMAB 60 MG/ML ~~LOC~~ SOSY
60.0000 mg | PREFILLED_SYRINGE | Freq: Once | SUBCUTANEOUS | Status: AC
  Administered 2022-03-13: 60 mg via SUBCUTANEOUS

## 2022-03-13 NOTE — Progress Notes (Signed)
Prolia given.   Please co-sign.  

## 2022-04-08 ENCOUNTER — Other Ambulatory Visit: Payer: Self-pay | Admitting: Internal Medicine

## 2022-05-11 ENCOUNTER — Other Ambulatory Visit: Payer: Self-pay | Admitting: Internal Medicine

## 2022-05-15 ENCOUNTER — Ambulatory Visit: Payer: 59 | Admitting: Internal Medicine

## 2022-10-02 ENCOUNTER — Encounter: Payer: 59 | Admitting: Internal Medicine

## 2022-10-30 ENCOUNTER — Encounter: Payer: 59 | Admitting: Internal Medicine

## 2022-11-11 ENCOUNTER — Ambulatory Visit (INDEPENDENT_AMBULATORY_CARE_PROVIDER_SITE_OTHER): Payer: 59 | Admitting: Internal Medicine

## 2022-11-11 VITALS — BP 118/66 | HR 76 | Temp 98.0°F | Ht 67.0 in | Wt 157.0 lb

## 2022-11-11 DIAGNOSIS — Z0001 Encounter for general adult medical examination with abnormal findings: Secondary | ICD-10-CM

## 2022-11-11 DIAGNOSIS — R739 Hyperglycemia, unspecified: Secondary | ICD-10-CM | POA: Diagnosis not present

## 2022-11-11 DIAGNOSIS — G8929 Other chronic pain: Secondary | ICD-10-CM

## 2022-11-11 DIAGNOSIS — E538 Deficiency of other specified B group vitamins: Secondary | ICD-10-CM

## 2022-11-11 DIAGNOSIS — E7849 Other hyperlipidemia: Secondary | ICD-10-CM | POA: Diagnosis not present

## 2022-11-11 DIAGNOSIS — F411 Generalized anxiety disorder: Secondary | ICD-10-CM | POA: Diagnosis not present

## 2022-11-11 DIAGNOSIS — D509 Iron deficiency anemia, unspecified: Secondary | ICD-10-CM

## 2022-11-11 DIAGNOSIS — F5101 Primary insomnia: Secondary | ICD-10-CM | POA: Diagnosis not present

## 2022-11-11 DIAGNOSIS — M545 Low back pain, unspecified: Secondary | ICD-10-CM

## 2022-11-11 DIAGNOSIS — E559 Vitamin D deficiency, unspecified: Secondary | ICD-10-CM

## 2022-11-11 DIAGNOSIS — F32A Depression, unspecified: Secondary | ICD-10-CM

## 2022-11-11 DIAGNOSIS — G43009 Migraine without aura, not intractable, without status migrainosus: Secondary | ICD-10-CM

## 2022-11-11 DIAGNOSIS — R5383 Other fatigue: Secondary | ICD-10-CM

## 2022-11-11 LAB — HEPATIC FUNCTION PANEL
ALT: 12 U/L (ref 0–35)
AST: 20 U/L (ref 0–37)
Albumin: 4.5 g/dL (ref 3.5–5.2)
Alkaline Phosphatase: 74 U/L (ref 39–117)
Bilirubin, Direct: 0.1 mg/dL (ref 0.0–0.3)
Total Bilirubin: 0.5 mg/dL (ref 0.2–1.2)
Total Protein: 7.3 g/dL (ref 6.0–8.3)

## 2022-11-11 LAB — TSH: TSH: 1.92 u[IU]/mL (ref 0.35–5.50)

## 2022-11-11 LAB — FERRITIN: Ferritin: 60 ng/mL (ref 10.0–291.0)

## 2022-11-11 LAB — CBC WITH DIFFERENTIAL/PLATELET
Basophils Absolute: 0.1 10*3/uL (ref 0.0–0.1)
Basophils Relative: 1 % (ref 0.0–3.0)
Eosinophils Absolute: 0.2 10*3/uL (ref 0.0–0.7)
Eosinophils Relative: 3.6 % (ref 0.0–5.0)
HCT: 44 % (ref 36.0–46.0)
Hemoglobin: 14.8 g/dL (ref 12.0–15.0)
Lymphocytes Relative: 34.2 % (ref 12.0–46.0)
Lymphs Abs: 2 10*3/uL (ref 0.7–4.0)
MCHC: 33.7 g/dL (ref 30.0–36.0)
MCV: 87.9 fl (ref 78.0–100.0)
Monocytes Absolute: 0.6 10*3/uL (ref 0.1–1.0)
Monocytes Relative: 10.7 % (ref 3.0–12.0)
Neutro Abs: 3 10*3/uL (ref 1.4–7.7)
Neutrophils Relative %: 50.5 % (ref 43.0–77.0)
Platelets: 276 10*3/uL (ref 150.0–400.0)
RBC: 5 Mil/uL (ref 3.87–5.11)
RDW: 13.2 % (ref 11.5–15.5)
WBC: 6 10*3/uL (ref 4.0–10.5)

## 2022-11-11 LAB — URINALYSIS, ROUTINE W REFLEX MICROSCOPIC
Bilirubin Urine: NEGATIVE
Hgb urine dipstick: NEGATIVE
Ketones, ur: NEGATIVE
Leukocytes,Ua: NEGATIVE
Nitrite: NEGATIVE
Specific Gravity, Urine: 1.02 (ref 1.000–1.030)
Total Protein, Urine: NEGATIVE
Urine Glucose: NEGATIVE
Urobilinogen, UA: 0.2 (ref 0.0–1.0)
pH: 6.5 (ref 5.0–8.0)

## 2022-11-11 LAB — CK: Total CK: 187 U/L — ABNORMAL HIGH (ref 7–177)

## 2022-11-11 LAB — BASIC METABOLIC PANEL
BUN: 14 mg/dL (ref 6–23)
CO2: 29 mEq/L (ref 19–32)
Calcium: 9.5 mg/dL (ref 8.4–10.5)
Chloride: 104 mEq/L (ref 96–112)
Creatinine, Ser: 0.85 mg/dL (ref 0.40–1.20)
GFR: 72.47 mL/min (ref >60.00)
Glucose, Bld: 87 mg/dL (ref 70–99)
Potassium: 4.2 mEq/L (ref 3.5–5.1)
Sodium: 139 mEq/L (ref 135–145)

## 2022-11-11 LAB — VITAMIN B12: Vitamin B-12: 265 pg/mL (ref 211–911)

## 2022-11-11 LAB — LIPID PANEL
Cholesterol: 184 mg/dL (ref 0–200)
HDL: 72.4 mg/dL (ref >39.00)
LDL Cholesterol: 99 mg/dL (ref 0–99)
NonHDL: 112.03
Total CHOL/HDL Ratio: 3
Triglycerides: 67 mg/dL (ref 0.0–149.0)
VLDL: 13.4 mg/dL (ref 0.0–40.0)

## 2022-11-11 LAB — CORTISOL: Cortisol, Plasma: 7.9 ug/dL

## 2022-11-11 LAB — IBC PANEL
Iron: 95 ug/dL (ref 42–145)
Saturation Ratios: 38.6 % (ref 20.0–50.0)
TIBC: 246.4 ug/dL — ABNORMAL LOW (ref 250.0–450.0)
Transferrin: 176 mg/dL — ABNORMAL LOW (ref 212.0–360.0)

## 2022-11-11 LAB — SEDIMENTATION RATE: Sed Rate: 22 mm/hr (ref 0–30)

## 2022-11-11 LAB — HEMOGLOBIN A1C: Hgb A1c MFr Bld: 5.7 % (ref 4.6–6.5)

## 2022-11-11 LAB — VITAMIN D 25 HYDROXY (VIT D DEFICIENCY, FRACTURES): VITD: 39.91 ng/mL (ref 30.00–100.00)

## 2022-11-11 MED ORDER — RIZATRIPTAN BENZOATE 10 MG PO TBDP: 10.0000 mg | ORAL_TABLET | ORAL | 5 refills | Status: DC | PRN

## 2022-11-11 MED ORDER — ZOLPIDEM TARTRATE 10 MG PO TABS: ORAL_TABLET | ORAL | 1 refills | Status: DC

## 2022-11-11 MED ORDER — TRAMADOL HCL 50 MG PO TABS: 50.0000 mg | ORAL_TABLET | Freq: Four times a day (QID) | ORAL | 0 refills | Status: DC | PRN

## 2022-11-11 NOTE — Patient Instructions (Addendum)
Ok to stop the wellbutrin for now  We will need to check on the Prolia for you next shot  Please take all new medication as prescribed  - the generic maxalt as needed for Headache  Please continue all other medications as before, and refills have been done if requested - tramadol and ambien  Please have the pharmacy call with any other refills you may need.  Please continue your efforts at being more active, low cholesterol diet, and weight control.  You are otherwise up to date with prevention measures today.  Please keep your appointments with your specialists as you may have planned  Please go to the LAB at the blood drawing area for the tests to be done  You will be contacted by phone if any changes need to be made immediately.  Otherwise, you will receive a letter about your results with an explanation, but please check with MyChart first.  Please remember to sign up for MyChart if you have not done so, as this will be important to you in the future with finding out test results, communicating by private email, and scheduling acute appointments online when needed.  Please make an Appointment to return in 6 months, or sooner if needed

## 2022-11-11 NOTE — Progress Notes (Unsigned)
Patient ID: Denise Park, female   DOB: [birth year], 66 y.o.   MRN: [id number]         Chief Complaint:: wellness exam and Physical (Frequent headaches possibly due to stress and not eating a healthy diet, tiredness)  , fatigue, chronic lbp, depression anxiety, migraine, hld       HPI:  Denise Park is a 66 y.o. female here for wellness exam; plans to see GYN soon for pap and mammogram, declines covid booster, flu shot but might have shingrix at the pharmacy, o/w up to date                        Also c/o fatigue, exhaustion at times more than usual recently, getting older but hoping that is not the main problem; much more stress and recurring Headaches, throbbing, usually dcorwn but can be whole head or post HA.  No longer wants to take wellbutrin, trying to wean herself down as becoming concerned she has been taking several years albeit successfully, with now mild to mod worsening depressive symptoms, but no suicidal ideation, or panic; has ongoing anxiety, some worsening as well it seems. Also with worsening insomnia.   No specific stressors.  Not taking buspar but would consider starting. Gained several lbs with less activity and poor diet per pt, but plans to do better.  Wt still normal.  .  Pt continues to have recurring LBP without change in severity, bowel or bladder change, fever, wt loss,  worsening LE pain/numbness/weakness, gait change or falls. - tylenol advil not working well.  Due for prolia as soon as can be arranged. Wt Readings from Last 3 Encounters:  11/11/22 157 lb (71.2 kg)  02/13/22 153 lb (69.4 kg)  09/29/21 151 lb (68.5 kg)   BP Readings from Last 3 Encounters:  11/11/22 118/66  02/13/22 118/70  09/29/21 118/70   Immunization History  Administered Date(s) Administered   Tdap 10/06/2017   Health Maintenance Due  Topic Date Due   PAP SMEAR-Modifier  07/21/2021      Past Medical History:  Diagnosis Date   ALLERGIC RHINITIS 10/30/2007   ANEMIA-IRON DEFICIENCY  10/30/2007   ANXIETY 10/30/2007   BACK PAIN 02/13/2008   COMMON MIGRAINE 10/30/2007   DEPRESSION 10/30/2007   GERD 10/30/2007   Hyperlipidemia 09/26/2014   HYPERSOMNIA 09/11/2008   INSOMNIA-SLEEP DISORDER-UNSPEC 09/11/2008   IRRITABLE BOWEL SYNDROME, HX OF 10/30/2007   OSTEOPENIA 02/13/2008   Osteopenia 05/22/2011   PELVIC PAIN, CHRONIC 05/14/2009   SINUSITIS- ACUTE-NOS 02/13/2008   Past Surgical History:  Procedure Laterality Date   CESAREAN SECTION     TUBAL LIGATION      reports that she has never smoked. She has never used smokeless tobacco. She reports current alcohol use. She reports that she does not use drugs. family history includes Coronary artery disease in an other family member; Depression in an other family member. No Known Allergies Current Outpatient Medications on File Prior to Visit  Medication Sig Dispense Refill   ALPRAZolam (XANAX) 0.25 MG tablet Take 1 tablet (0.25 mg total) by mouth 2 (two) times daily as needed for anxiety. 60 tablet 2   busPIRone (BUSPAR) 10 MG tablet TAKE 1 TABLET BY MOUTH THREE TIMES A DAY AS NEEDED 270 tablet 1   CALCIUM-VITAMIN D PO Take by mouth daily.     cetirizine (ZYRTEC) 10 MG tablet TAKE 1 TABLET(10 MG) BY MOUTH DAILY 90 tablet 3   diclofenac Sodium (VOLTAREN) 1 % GEL Apply  2 g topically 4 (four) times daily. 100 g 5   guaiFENesin (MUCINEX) 600 MG 12 hr tablet Take 2 tablets (1,200 mg total) by mouth 2 (two) times daily as needed. 60 tablet 1   IMVEXXY MAINTENANCE PACK 4 MCG INST Place 1 capsule vaginally 2 (two) times a week.     meloxicam (MOBIC) 15 MG tablet TAKE 1 TABLET BY MOUTH EVERY DAY AS NEEDED FOR PAIN 90 tablet 2   triamcinolone (NASACORT) 55 MCG/ACT AERO nasal inhaler Place 2 sprays into the nose daily. 1 each 12   traMADol (ULTRAM) 50 MG tablet Take 1 tablet (50 mg total) by mouth every 6 (six) hours as needed. (Patient not taking: Reported on 11/11/2022) 120 tablet 3   tretinoin (RETIN-A) 0.05 % cream APPLY TO AFFECTED AREA EVERY  DAY AT BEDTIME     No current facility-administered medications on file prior to visit.        ROS:  All others reviewed and negative.  Objective        PE:  BP 118/66 (BP Location: Left Arm, Patient Position: Sitting, Cuff Size: Large)   Pulse 76   Temp 98 F (36.7 C) (Oral)   Ht '5\' 7"'$  (1.702 m)   Wt 157 lb (71.2 kg)   SpO2 95%   BMI 24.59 kg/m                 Constitutional: Pt appears in NAD               HENT: Head: NCAT.                Right Ear: External ear normal.                 Left Ear: External ear normal.                Eyes: . Pupils are equal, round, and reactive to light. Conjunctivae and EOM are normal               Nose: without d/c or deformity               Neck: Neck supple. Gross normal ROM               Cardiovascular: Normal rate and regular rhythm.                 Pulmonary/Chest: Effort normal and breath sounds without rales or wheezing.                Abd:  Soft, NT, ND, + BS, no organomegaly               Neurological: Pt is alert. At baseline orientation, motor grossly intact, CN 2-12 intact               Skin: Skin is warm. No rashes, no other new lesions, LE edema - none               Psychiatric: Pt behavior is normal without agitation but with depressed nervous affect  Micro: none  Cardiac tracings I have personally interpreted today:  none  Pertinent Radiological findings (summarize): none   Lab Results  Component Value Date   WBC 6.0 11/11/2022   HGB 14.8 11/11/2022   HCT 44.0 11/11/2022   PLT 276.0 11/11/2022   GLUCOSE 87 11/11/2022   CHOL 184 11/11/2022   TRIG 67.0 11/11/2022   HDL 72.40 11/11/2022   LDLCALC 99 11/11/2022   ALT  12 11/11/2022   AST 20 11/11/2022   NA 139 11/11/2022   K 4.2 11/11/2022   CL 104 11/11/2022   CREATININE 0.85 11/11/2022   BUN 14 11/11/2022   CO2 29 11/11/2022   TSH 1.92 11/11/2022   HGBA1C 5.7 11/11/2022   Assessment/Plan:  Denise Park is a 66 y.o. White or Caucasian [1] female with  has a  past medical history of ALLERGIC RHINITIS (10/30/2007), ANEMIA-IRON DEFICIENCY (10/30/2007), ANXIETY (10/30/2007), BACK PAIN (02/13/2008), COMMON MIGRAINE (10/30/2007), DEPRESSION (10/30/2007), GERD (10/30/2007), Hyperlipidemia (09/26/2014), HYPERSOMNIA (09/11/2008), INSOMNIA-SLEEP DISORDER-UNSPEC (09/11/2008), IRRITABLE BOWEL SYNDROME, HX OF (10/30/2007), OSTEOPENIA (02/13/2008), Osteopenia (05/22/2011), PELVIC PAIN, CHRONIC (05/14/2009), and SINUSITIS- ACUTE-NOS (02/13/2008).  Encounter for well adult exam with abnormal findings Age and sex appropriate education and counseling updated with regular exercise and diet Referrals for preventative services - none needed Immunizations addressed - declines covid booster, flu and shingrix for now Smoking counseling  - none needed Evidence for depression or other mood disorder - worsening anxiety/depression Most recent labs reviewed. I have personally reviewed and have noted: 1) the patient's medical and social history 2) The patient's current medications and supplements 3) The patient's height, weight, and BMI have been recorded in the chart   Migraine without aura No hx of CV disease, ok for maxalt mlt 10 mg prn,  to f/u any worsening symptoms or concerns  Lower back pain Chornic persistent moderate without new neuro changes, for tramadol 50 mg prn,  to f/u any worsening symptoms or concerns  Insomnia Ok for otc melatonin up to 10 mg qhs prn, as well as ambien 5 mg qhs prn if not working  Hyperlipidemia Lab Results  Component Value Date   [place] 99 11/11/2022   Stable, pt to continue current statin   Depression Pt endorses worsening mild to mod worsening, declines counseling referral, pt is determined to finally stop the wellbutrin though appears her symtpoms are worsening; declines other tx for now,  to f/u any worsening symptoms or concerns  Anxiety state Ok for buspar 10 mg tid prn,  to f/u any worsening symptoms or concerns  ANEMIA-IRON  DEFICIENCY No overt bleeding, also for lab f/u today  Fatigue I suspect primarily due to age, lack of conditioning and exercise, and worsening depression.  Also check cortisol level  Followup: Return in about 6 months (around 05/12/2023).  Cathlean Cower, MD 11/12/2022 12:32 PM [facility]

## 2022-11-12 ENCOUNTER — Encounter: Payer: Self-pay | Admitting: Internal Medicine

## 2022-11-12 NOTE — Assessment & Plan Note (Signed)
No hx of CV disease, ok for maxalt mlt 10 mg prn,  to f/u any worsening symptoms or concerns

## 2022-11-12 NOTE — Assessment & Plan Note (Signed)
Ok for buspar 10 mg tid prn,  to f/u any worsening symptoms or concerns

## 2022-11-12 NOTE — Assessment & Plan Note (Signed)
I suspect primarily due to age, lack of conditioning and exercise, and worsening depression.  Also check cortisol level

## 2022-11-12 NOTE — Assessment & Plan Note (Signed)
Age and sex appropriate education and counseling updated with regular exercise and diet Referrals for preventative services - none needed Immunizations addressed - declines covid booster, flu and shingrix for now Smoking counseling  - none needed Evidence for depression or other mood disorder - worsening anxiety/depression Most recent labs reviewed. I have personally reviewed and have noted: 1) the patient's medical and social history 2) The patient's current medications and supplements 3) The patient's height, weight, and BMI have been recorded in the chart

## 2022-11-12 NOTE — Assessment & Plan Note (Signed)
No overt bleeding, also for lab f/u today °

## 2022-11-12 NOTE — Assessment & Plan Note (Signed)
Chornic persistent moderate without new neuro changes, for tramadol 50 mg prn,  to f/u any worsening symptoms or concerns

## 2022-11-12 NOTE — Assessment & Plan Note (Signed)
Pt endorses worsening mild to mod worsening, declines counseling referral, pt is determined to finally stop the wellbutrin though appears her symtpoms are worsening; declines other tx for now,  to f/u any worsening symptoms or concerns

## 2022-11-12 NOTE — Assessment & Plan Note (Addendum)
Ok for otc melatonin up to 10 mg qhs prn, as well as ambien 5 mg qhs prn if not working

## 2022-11-12 NOTE — Assessment & Plan Note (Signed)
Lab Results  Component Value Date   LDLCALC 99 11/11/2022   Stable, pt to continue current statin

## 2022-11-18 ENCOUNTER — Telehealth: Payer: Self-pay

## 2022-11-18 NOTE — Telephone Encounter (Signed)
Pt PA for tramadol HCI '50mg'$  is send to plan, waiting for approval  Key Key: [id number]

## 2022-11-19 ENCOUNTER — Encounter: Payer: Self-pay | Admitting: Internal Medicine

## 2022-11-19 NOTE — Telephone Encounter (Signed)
Pt PA for tramadol '50mg'$  was approved

## 2022-11-19 NOTE — Telephone Encounter (Signed)
There is no specific treatment for the lab values she mentions,  but as the ferritin and iron levels were ok, we can hold change in tx at this time

## 2022-12-23 ENCOUNTER — Telehealth: Payer: Self-pay | Admitting: Internal Medicine

## 2022-12-23 NOTE — Telephone Encounter (Signed)
Patient called to see if her prolia injection was ever approved by her insurance.  Her last injection was in march, 2023\  Please call patient and let her know  Patient's phone #  [phone]

## 2022-12-24 NOTE — Telephone Encounter (Signed)
Patient is inquiring about Prolia, she had her last one in March of 2023.Please advise if possible

## 2022-12-31 NOTE — Telephone Encounter (Signed)
Pt has called to check if her insurance has approved her to receive the Prolia injections.

## 2023-01-01 NOTE — Telephone Encounter (Signed)
Prolia VOB initiated via [email or web address]  Last OV:  Next OV:  Last Prolia inj: 03/13/22 Next Prolia inj DUE: 09/14/22

## 2023-01-13 ENCOUNTER — Encounter: Payer: Self-pay | Admitting: Internal Medicine

## 2023-01-13 NOTE — Telephone Encounter (Signed)
Prior auth required for PROLIA - copay enrollment  COVERAGE DETAILS: Prolia and administration will be subject to $350.00 deductible ($0.00 met) and 15% coinsurance up to a $5000.00 out of pocket max ($0.00 met). Deductible does contribute to the OOP max. The benefits provided on this Verification of Benefits form are Medical Benefits and are the patient's In-Network benefits for Prolia. If you would like Pharmacy Benefits for Prolia, please call [phone]

## 2023-01-21 ENCOUNTER — Telehealth: Payer: Self-pay

## 2023-01-21 ENCOUNTER — Other Ambulatory Visit (HOSPITAL_COMMUNITY): Payer: Self-pay

## 2023-01-21 NOTE — Telephone Encounter (Signed)
Pt ready for scheduling on or after 01/21/23  Out-of-pocket cost due at time of visit: $430  Primary: [employer] - Commercial Prolia co-insurance: 15% (approximately $405) Admin fee co-insurance: 15% (approximately $25)  Deductible: $350 ($0 met)  Secondary:  Prolia co-insurance:  Admin fee co-insurance:   Deductible:   Prior Auth: submitted for pharmacy benefit PA# Valid:   ** This summary of benefits is an estimation of the patient's out-of-pocket cost. Exact cost may vary based on individual plan coverage.   ** Patient may be eligible for the [place] program to assist with Prolia co-pay at [email or web address]

## 2023-02-02 ENCOUNTER — Other Ambulatory Visit (HOSPITAL_COMMUNITY): Payer: Self-pay

## 2023-02-02 NOTE — Telephone Encounter (Signed)
Prior Authorization initiated for [facility] via [employer] Case ID: [id number], [id number] - pending tech review

## 2023-02-04 NOTE — Telephone Encounter (Signed)
Patient Advocate Encounter  Prior Authorization for Prolia 65m has been approved.    PA# [id number] dates: 02/02/23 through 02/02/24

## 2023-02-08 ENCOUNTER — Other Ambulatory Visit (HOSPITAL_COMMUNITY): Payer: Self-pay

## 2023-02-08 ENCOUNTER — Ambulatory Visit (INDEPENDENT_AMBULATORY_CARE_PROVIDER_SITE_OTHER): Payer: 59

## 2023-02-08 DIAGNOSIS — M810 Age-related osteoporosis without current pathological fracture: Secondary | ICD-10-CM

## 2023-02-08 MED ORDER — DENOSUMAB 60 MG/ML ~~LOC~~ SOSY
60.0000 mg | PREFILLED_SYRINGE | Freq: Once | SUBCUTANEOUS | Status: AC
  Administered 2023-02-08: 60 mg via SUBCUTANEOUS

## 2023-02-08 NOTE — Progress Notes (Signed)
Prolia given.   Please co-sign.

## 2023-02-08 NOTE — Telephone Encounter (Addendum)
PA# [id number] Valid: 02/02/23-02/02/24

## 2023-02-08 NOTE — Telephone Encounter (Signed)
Is the patient responsibility 430.00? She is scheduled for this afternoon and I just wanted to double check before we check her in.

## 2023-02-16 NOTE — Telephone Encounter (Signed)
Forwarding to Rx Prior Auth Team 

## 2023-04-23 ENCOUNTER — Other Ambulatory Visit: Payer: Self-pay | Admitting: Internal Medicine

## 2023-04-30 ENCOUNTER — Other Ambulatory Visit: Payer: Self-pay

## 2023-05-21 ENCOUNTER — Ambulatory Visit: Payer: 59 | Admitting: Internal Medicine

## 2023-05-21 ENCOUNTER — Encounter: Payer: Self-pay | Admitting: Internal Medicine

## 2023-05-21 ENCOUNTER — Ambulatory Visit (INDEPENDENT_AMBULATORY_CARE_PROVIDER_SITE_OTHER): Payer: 59

## 2023-05-21 ENCOUNTER — Ambulatory Visit: Payer: 59 | Admitting: Family Medicine

## 2023-05-21 ENCOUNTER — Other Ambulatory Visit: Payer: Self-pay

## 2023-05-21 VITALS — BP 122/76 | HR 65 | Ht 67.0 in | Wt 160.0 lb

## 2023-05-21 VITALS — BP 122/76 | HR 65 | Temp 98.5°F | Ht 67.0 in | Wt 160.0 lb

## 2023-05-21 DIAGNOSIS — M25561 Pain in right knee: Secondary | ICD-10-CM

## 2023-05-21 DIAGNOSIS — Z0001 Encounter for general adult medical examination with abnormal findings: Secondary | ICD-10-CM

## 2023-05-21 DIAGNOSIS — F411 Generalized anxiety disorder: Secondary | ICD-10-CM | POA: Diagnosis not present

## 2023-05-21 DIAGNOSIS — E7849 Other hyperlipidemia: Secondary | ICD-10-CM | POA: Diagnosis not present

## 2023-05-21 DIAGNOSIS — Z0000 Encounter for general adult medical examination without abnormal findings: Secondary | ICD-10-CM | POA: Diagnosis not present

## 2023-05-21 DIAGNOSIS — G8929 Other chronic pain: Secondary | ICD-10-CM

## 2023-05-21 DIAGNOSIS — F32A Depression, unspecified: Secondary | ICD-10-CM | POA: Diagnosis not present

## 2023-05-21 DIAGNOSIS — J309 Allergic rhinitis, unspecified: Secondary | ICD-10-CM

## 2023-05-21 MED ORDER — GUAIFENESIN ER 600 MG PO TB12: 1200.0000 mg | ORAL_TABLET | Freq: Two times a day (BID) | ORAL | 5 refills | Status: DC | PRN

## 2023-05-21 MED ORDER — DICLOFENAC SODIUM 1 % EX GEL: 2.0000 g | Freq: Four times a day (QID) | CUTANEOUS | 5 refills | Status: AC

## 2023-05-21 MED ORDER — TRIAMCINOLONE ACETONIDE 55 MCG/ACT NA AERO: 2.0000 | INHALATION_SPRAY | Freq: Every day | NASAL | 12 refills | Status: AC

## 2023-05-21 NOTE — Patient Instructions (Addendum)
Thank you for coming in today.   You received an injection today. Seek immediate medical attention if the joint becomes red, extremely painful, or is oozing fluid.   You received an injection today. Seek immediate medical attention if the joint becomes red, extremely painful, or is oozing fluid.   Please use Voltaren gel (Generic Diclofenac Gel) up to 4x daily for pain as needed.  This is available over-the-counter as both the name brand Voltaren gel and the generic diclofenac gel.   I recommend you obtained a compression sleeve to help with your joint problems. There are many options on the market however I recommend obtaining a full knee Body Helix compression sleeve.  You can find information (including how to appropriate measure yourself for sizing) can be found at [email or web address].ch.  Many of these products are health savings account (HSA) eligible.   You can use the compression sleeve at any time throughout the day but is most important to use while being active as well as for 2 hours post-activity.   It is appropriate to ice following activity with the compression sleeve in place.

## 2023-05-21 NOTE — Progress Notes (Signed)
Rubin Payor, PhD, LAT, ATC acting as a scribe for Clementeen Graham, MD.  Denise Park is a 67 y.o. female who presents to [facility] at [facility] today for R knee pain x several months. She recalls a morning where she stepped on her dog getting out of bed, falling, possibly hitting her knee.  Pt locates pain to the medial aspect of her R knee. She works as a Museum/gallery curator.   R Knee swelling: yes Mechanical symptoms: yes- sometimes feels like it going to "give out" Aggravates: deep bending, kneeling, varies day-to-day Treatments tried: arthritis cream, IBU  Pertinent review of systems: No fevers or chills  Relevant historical information: Osteoporosis   Exam:  BP 122/76   Pulse 65   Ht 5\' 7"  (1.702 m)   Wt 160 lb (72.6 kg)   LMP 03/31/2011   SpO2 99%   BMI 25.06 kg/m  General: Well Developed, well nourished, and in no acute distress.   MSK: Right knee mild effusion normal-appearing otherwise. Normal motion. Tender palpation medial joint line. Stable ligamentous exam. Intact strength. Minimally positive McMurray's test.    Lab and Radiology Results  Procedure: Real-time Ultrasound Guided Injection of right knee joint superior lateral patellar space Device: Philips Affiniti 50G Images permanently stored and available for review in PACS Verbal informed consent obtained.  Discussed risks and benefits of procedure. Warned about infection, bleeding, hyperglycemia damage to structures among others. Patient expresses understanding and agreement Time-out conducted.   Noted no overlying erythema, induration, or other signs of local infection.   Skin prepped in a sterile fashion.   Local anesthesia: Topical Ethyl chloride.   With sterile technique and under real time ultrasound guidance: 40 mg of Kenalog and 2 ml Marcaine injected into the joint. Fluid seen entering the joint capsule.   Completed without difficulty   Pain immediately resolved suggesting  accurate placement of the medication.   Advised to call if fevers/chills, erythema, induration, drainage, or persistent bleeding.   Images permanently stored and available for review in the ultrasound unit.  Impression: Technically successful ultrasound guided injection.    X-ray images right knee obtained today personally and independently interpreted. Mild medial compartment DJD.  No acute fractures. Await formal radiology review     Assessment and Plan: 67 y.o. 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  There may have the a degenerative meniscus tear as well although it is hard to tell for sure.  She may have worse articular cartilage wear that is apparent on x-ray.  She may have effectively a pothole of missing cartilage causing her pain.  Plan for steroid injection Voltaren gel and compression sleeve.  Check back as needed.   PDMP not reviewed this encounter. Orders Placed This Encounter  Procedures   [place] LIMITED JOINT SPACE STRUCTURES LOW RIGHT(NO LINKED CHARGES)    Order Specific Question:   Reason for Exam (SYMPTOM  OR DIAGNOSIS REQUIRED)    Answer:   right knee pain    Order Specific Question:   Preferred imaging location?    Answer:   [facility] Knee AP/LAT W/[place] Right    Standing Status:   Future    Number of Occurrences:   1    Standing Expiration Date:   06/20/2023    Order Specific Question:   Reason for Exam (SYMPTOM  OR DIAGNOSIS REQUIRED)    Answer:   right knee pain  Order Specific Question:   Preferred imaging location?    Answer:   [facility]   No orders of the defined types were placed in this encounter.    Discussed warning signs or symptoms. Please see discharge instructions. Patient expresses understanding.   The above documentation has been reviewed and is accurate and complete Clementeen Graham, M.D.

## 2023-05-21 NOTE — Patient Instructions (Addendum)
Please have your Shingrix (shingles) shots done at your local pharmacy.  Please continue all other medications as before, and refills have been done if requested.  Please have the pharmacy call with any other refills you may need.  Please continue your efforts at being more active, low cholesterol diet, and weight control.  You are otherwise up to date with prevention measures today.  Please keep your appointments with your specialists as you may have planned  Please see Sports medicine on the first floor for the right knee arthritis  We can hold on further lab testing  Please make an Appointment to return in 6 months, or sooner if needed

## 2023-05-21 NOTE — Progress Notes (Unsigned)
Patient ID: Denise Park, female   DOB: Dec 28, 1955, 67 y.o.   MRN: [id number]         Chief Complaint:: wellness exam and right knee pain, hld, depression, anxiety , allergies       HPI:  Denise Park is a 67 y.o. female here for wellness exam; for shingrix at pharmacy, pt to call for mammogram, has colonoscopy next month, o/w up to date                        Also has persistent right knee pain and mild intermittent swelling worsening in the past 6 mo, new problem for her, tylenol not helping  Feels like might giveaway but none yet, no falls, and pain is mostly medial right knee.  Pt denies chest pain, increased sob or doe, wheezing, orthopnea, PND, increased LE swelling, palpitations, dizziness or syncope.   Pt denies polydipsia, polyuria, or new focal neuro s/s.    Pt denies fever, wt loss, night sweats, loss of appetite, or other constitutional symptoms  Also has appt later today with dermatology for yearly exam   Wt Readings from Last 3 Encounters:  05/21/23 160 lb (72.6 kg)  05/21/23 160 lb (72.6 kg)  11/11/22 157 lb (71.2 kg)   BP Readings from Last 3 Encounters:  05/21/23 122/76  05/21/23 122/76  11/11/22 118/66   Immunization History  Administered Date(s) Administered   Tdap 10/06/2017   Health Maintenance Due  Topic Date Due   Zoster Vaccines- Shingrix (1 of 2) Never done   Colonoscopy  05/23/2023      Past Medical History:  Diagnosis Date   ALLERGIC RHINITIS 10/30/2007   ANEMIA-IRON DEFICIENCY 10/30/2007   ANXIETY 10/30/2007   BACK PAIN 02/13/2008   COMMON MIGRAINE 10/30/2007   DEPRESSION 10/30/2007   GERD 10/30/2007   Hyperlipidemia 09/26/2014   HYPERSOMNIA 09/11/2008   INSOMNIA-SLEEP DISORDER-UNSPEC 09/11/2008   IRRITABLE BOWEL SYNDROME, HX OF 10/30/2007   OSTEOPENIA 02/13/2008   Osteopenia 05/22/2011   PELVIC PAIN, CHRONIC 05/14/2009   SINUSITIS- ACUTE-NOS 02/13/2008   Past Surgical History:  Procedure Laterality Date   CESAREAN SECTION     TUBAL LIGATION       reports that she has never smoked. She has never used smokeless tobacco. She reports current alcohol use. She reports that she does not use drugs. family history includes Coronary artery disease in an other family member; Depression in an other family member. No Known Allergies Current Outpatient Medications on File Prior to Visit  Medication Sig Dispense Refill   ALPRAZolam (XANAX) 0.25 MG tablet Take 1 tablet (0.25 mg total) by mouth 2 (two) times daily as needed for anxiety. 60 tablet 2   buPROPion ER (WELLBUTRIN SR) 100 MG 12 hr tablet Take 100 mg by mouth 2 (two) times daily.     busPIRone (BUSPAR) 10 MG tablet TAKE 1 TABLET BY MOUTH THREE TIMES A DAY AS NEEDED 270 tablet 1   CALCIUM-VITAMIN D PO Take by mouth daily.     IMVEXXY MAINTENANCE PACK 4 MCG INST Place 1 capsule vaginally 2 (two) times a week.     meloxicam (MOBIC) 15 MG tablet TAKE 1 TABLET BY MOUTH EVERY DAY AS NEEDED FOR PAIN 90 tablet 2   tretinoin (RETIN-A) 0.05 % cream APPLY TO AFFECTED AREA EVERY DAY AT BEDTIME     zolpidem (AMBIEN) 10 MG tablet TAKE 1 TABLET BY MOUTH EVERY DAY AT BEDTIME AS NEEDED 90 tablet 1   cetirizine (ZYRTEC)  10 MG tablet TAKE 1 TABLET(10 MG) BY MOUTH DAILY 90 tablet 3   rizatriptan (MAXALT-MLT) 10 MG disintegrating tablet Take 1 tablet (10 mg total) by mouth as needed for migraine. May repeat in 2 hours if needed 10 tablet 5   traMADol (ULTRAM) 50 MG tablet Take 1 tablet (50 mg total) by mouth every 6 (six) hours as needed. 120 tablet 3   traMADol (ULTRAM) 50 MG tablet Take 1 tablet (50 mg total) by mouth every 6 (six) hours as needed. 120 tablet 0   No current facility-administered medications on file prior to visit.        ROS:  All others reviewed and negative.  Objective        PE:  BP 122/76 (BP Location: Right Arm, Patient Position: Sitting, Cuff Size: Normal)   Pulse 65   Temp 98.5 F (36.9 C) (Oral)   Ht 5\' 7"  (1.702 m)   Wt 160 lb (72.6 kg)   LMP 03/31/2011   SpO2 99%   BMI 25.06  kg/m                 Constitutional: Pt appears in NAD               HENT: Head: NCAT.                Right Ear: External ear normal.                 Left Ear: External ear normal.                Eyes: . Pupils are equal, round, and reactive to light. Conjunctivae and EOM are normal               Nose: without d/c or deformity               Neck: Neck supple. Gross normal ROM               Cardiovascular: Normal rate and regular rhythm.                 Pulmonary/Chest: Effort normal and breath sounds without rales or wheezing.                Abd:  Soft, NT, ND, + BS, no organomegaly               Neurological: Pt is alert. At baseline orientation, motor grossly intact               Skin: Skin is warm. No rashes, no other new lesions, LE edema - none, right knee with mild to mod bony degnenerative changes, no effusion, has FROM               Psychiatric: Pt behavior is normal without agitation   Micro: none  Cardiac tracings I have personally interpreted today:  none  Pertinent Radiological findings (summarize): none   Lab Results  Component Value Date   WBC 6.0 11/11/2022   HGB 14.8 11/11/2022   HCT 44.0 11/11/2022   PLT 276.0 11/11/2022   GLUCOSE 87 11/11/2022   CHOL 184 11/11/2022   TRIG 67.0 11/11/2022   HDL 72.40 11/11/2022   LDLCALC 99 11/11/2022   ALT 12 11/11/2022   AST 20 11/11/2022   NA 139 11/11/2022   K 4.2 11/11/2022   CL 104 11/11/2022   CREATININE 0.85 11/11/2022   BUN 14 11/11/2022   CO2 29 11/11/2022   TSH 1.92  11/11/2022   HGBA1C 5.7 11/11/2022   Assessment/Plan:  Denise Park is a 67 y.o. White or Caucasian [1] female with  has a past medical history of ALLERGIC RHINITIS (10/30/2007), ANEMIA-IRON DEFICIENCY (10/30/2007), ANXIETY (10/30/2007), BACK PAIN (02/13/2008), COMMON MIGRAINE (10/30/2007), DEPRESSION (10/30/2007), GERD (10/30/2007), Hyperlipidemia (09/26/2014), HYPERSOMNIA (09/11/2008), INSOMNIA-SLEEP DISORDER-UNSPEC (09/11/2008), IRRITABLE BOWEL SYNDROME,  HX OF (10/30/2007), OSTEOPENIA (02/13/2008), Osteopenia (05/22/2011), PELVIC PAIN, CHRONIC (05/14/2009), and SINUSITIS- ACUTE-NOS (02/13/2008).  Encounter for well adult exam with abnormal findings Age and sex appropriate education and counseling updated with regular exercise and diet Referrals for preventative services - pt to call for mammogram, has colonscopy sched for next month Immunizations addressed - for shingrix at pharmacy Smoking counseling  - none needed Evidence for depression or other mood disorder - none significant Most recent labs reviewed. I have personally reviewed and have noted: 1) the patient's medical and social history 2) The patient's current medications and supplements 3) The patient's height, weight, and BMI have been recorded in the chart   Hyperlipidemia Lab Results  Component Value Date   LDLCALC 99 11/11/2022   Stable, pt to continue current lower chol diet   Right knee pain C/w with mild to mod now symptomatic djd - for pt to see sports medicine on first floor, may need cortisone inj  Depression Stable, cont wellbutrin xl 100 bid  Anxiety state Stable, cont buspar 10 bid prn  Allergic rhinitis Stable, cont zyrtec 10 qd  Followup: Return in about 6 months (around 11/20/2023).  Oliver Barre, MD 05/22/2023 9:05 PM [facility]

## 2023-05-22 ENCOUNTER — Encounter: Payer: Self-pay | Admitting: Internal Medicine

## 2023-05-22 DIAGNOSIS — M25561 Pain in right knee: Secondary | ICD-10-CM | POA: Insufficient documentation

## 2023-05-22 NOTE — Assessment & Plan Note (Signed)
C/w with mild to mod now symptomatic djd - for pt to see sports medicine on first floor, may need cortisone inj

## 2023-05-22 NOTE — Assessment & Plan Note (Signed)
Stable, cont wellbutrin xl 100 bid

## 2023-05-22 NOTE — Assessment & Plan Note (Signed)
Age and sex appropriate education and counseling updated with regular exercise and diet Referrals for preventative services - pt to call for mammogram, has colonscopy sched for next month Immunizations addressed - for shingrix at pharmacy Smoking counseling  - none needed Evidence for depression or other mood disorder - none significant Most recent labs reviewed. I have personally reviewed and have noted: 1) the patient's medical and social history 2) The patient's current medications and supplements 3) The patient's height, weight, and BMI have been recorded in the chart

## 2023-05-22 NOTE — Assessment & Plan Note (Signed)
Stable, cont zyrtec 10 qd

## 2023-05-22 NOTE — Assessment & Plan Note (Signed)
Stable, cont buspar 10 bid prn

## 2023-05-22 NOTE — Assessment & Plan Note (Signed)
Lab Results  Component Value Date   LDLCALC 99 11/11/2022   Stable, pt to continue current lower chol diet

## 2023-05-23 ENCOUNTER — Encounter: Payer: Self-pay | Admitting: Internal Medicine

## 2023-05-23 DIAGNOSIS — Z1211 Encounter for screening for malignant neoplasm of colon: Secondary | ICD-10-CM

## 2023-05-24 NOTE — Progress Notes (Signed)
Right knee x-ray shows some arthritis especially the kneecap and on the inside part of the knee.

## 2023-06-27 ENCOUNTER — Other Ambulatory Visit: Payer: Self-pay | Admitting: Internal Medicine

## 2023-06-28 ENCOUNTER — Other Ambulatory Visit: Payer: Self-pay

## 2023-07-21 ENCOUNTER — Telehealth: Payer: Self-pay | Admitting: *Deleted

## 2023-07-21 ENCOUNTER — Ambulatory Visit: Payer: 59

## 2023-07-21 ENCOUNTER — Encounter: Payer: Self-pay | Admitting: Internal Medicine

## 2023-07-21 NOTE — Progress Notes (Unsigned)
Pt's name and DOB verified at the beginning of the pre-visit.  Pt denies any difficulty with ambulating,sitting, laying down or rolling side to side Gave both [facility] main # and MD on call # prior to instructions.  No egg or soy allergy known to patient  No issues known to pt with past sedation with any surgeries or procedures Pt denies having issues being intubated Patient denies ever being intubated Pt has no issues moving head neck or swallowing No FH of Malignant Hyperthermia Pt is not on diet pills Pt is not on home 02  Pt is not on blood thinners  Pt denies issues with constipation  Pt has frequent issues with constipation RN instructed pt to use Miralax per bottles instructions a week before prep days. Pt states they will Pt is not on dialysis Pt denise any abnormal heart rhythms  Pt denies any upcoming cardiac testing Pt encouraged to use to use Singlecare or Goodrx to reduce cost  Patient's chart reviewed by John Nulty CNRA prior to pre-visit and patient appropriate for the [facility].  Pre-visit completed and red dot placed by patient's name on their procedure day (on provider's schedule).  . Visit by phone Pt states weight is  Instructed pt why it is important to and  to call if they have any changes in health or new medications. Directed them to the # given and on instructions.   Pt states they will.  Instructions reviewed with pt and pt states understanding. Instructed to review again prior to procedure. Pt states they will.  Instructions sent by mail with coupon and by my chart   

## 2023-07-21 NOTE — Telephone Encounter (Signed)
Attempt to reach pt for pre-visit. LM with call back #.  Will attempt to reach again in  due to no other # listed in profile Second attempt to reach pt for pre-vist unsuccessful. LM with facility # for pt to call back. Instructed pt to call # given by end of the day and reschedule the pre-visit or the scheduled procedure will be canceled.

## 2023-08-12 ENCOUNTER — Ambulatory Visit (AMBULATORY_SURGERY_CENTER): Payer: 59 | Admitting: *Deleted

## 2023-08-12 VITALS — Ht 67.0 in | Wt 158.0 lb

## 2023-08-12 DIAGNOSIS — Z1211 Encounter for screening for malignant neoplasm of colon: Secondary | ICD-10-CM

## 2023-08-12 NOTE — Progress Notes (Addendum)
Pt's name and DOB verified at the beginning of the pre-visit.  Pt denies any difficulty with ambulating,sitting, laying down or rolling side to side Gave both [facility] main # and MD on call # prior to instructions.  No egg or soy allergy known to patient  No issues known to pt with past sedation with any surgeries or procedures Pt denies having issues being intubated Pt has no issues moving head neck or swallowing No FH of Malignant Hyperthermia Pt is not on diet pills Pt is not on home 02  Pt is not on blood thinners  Pt denies issues with constipation  Pt is not on dialysis Pt denise any abnormal heart rhythms  Pt denies any upcoming cardiac testing Pt encouraged to use to use Singlecare or Goodrx to reduce cost  Patient's chart reviewed by Cathlyn Parsons CNRA prior to pre-visit and patient appropriate for the [facility].  Pre-visit completed and red dot placed by patient's name on their procedure day (on provider's schedule).  . Visit by phone Pt states weight is 158 lb Instructed pt why it is important to and  to call if they have any changes in health or new medications. Directed them to the # given and on instructions.   Pt states they will.  Instructions reviewed with pt and pt states understanding. Instructed to review again prior to procedure. Pt states they will.  Instructions sent by mail with coupon and by my chart Pt states she has been having issues with swallowing and GERD  OV 10/27/23 with [place] [facility]   @ 9:30 am and come in 15 minutes early to discuss issues and set up Endo/Colon. Pt made aware that pre-visit is good for 90 days

## 2023-08-15 ENCOUNTER — Encounter: Payer: Self-pay | Admitting: Internal Medicine

## 2023-08-16 ENCOUNTER — Emergency Department (HOSPITAL_COMMUNITY): Payer: 59

## 2023-08-16 ENCOUNTER — Other Ambulatory Visit: Payer: Self-pay

## 2023-08-16 ENCOUNTER — Emergency Department (HOSPITAL_COMMUNITY)
Admission: EM | Admit: 2023-08-16 | Discharge: 2023-08-16 | Disposition: A | Discharge status: Home / Self Care | Payer: 59 | Attending: Emergency Medicine | Admitting: Emergency Medicine

## 2023-08-16 ENCOUNTER — Encounter (HOSPITAL_COMMUNITY): Payer: Self-pay

## 2023-08-16 DIAGNOSIS — J029 Acute pharyngitis, unspecified: Secondary | ICD-10-CM | POA: Diagnosis present

## 2023-08-16 DIAGNOSIS — K120 Recurrent oral aphthae: Secondary | ICD-10-CM | POA: Diagnosis not present

## 2023-08-16 DIAGNOSIS — B349 Viral infection, unspecified: Secondary | ICD-10-CM | POA: Diagnosis not present

## 2023-08-16 DIAGNOSIS — Z1152 Encounter for screening for COVID-19: Secondary | ICD-10-CM | POA: Insufficient documentation

## 2023-08-16 DIAGNOSIS — K1379 Other lesions of oral mucosa: Secondary | ICD-10-CM

## 2023-08-16 LAB — CBC WITH DIFFERENTIAL/PLATELET
Abs Immature Granulocytes: 0.03 10*3/uL (ref 0.00–0.07)
Basophils Absolute: 0.1 10*3/uL (ref 0.0–0.1)
Basophils Relative: 1 %
Eosinophils Absolute: 0.2 10*3/uL (ref 0.0–0.5)
Eosinophils Relative: 3 %
HCT: 47.8 % — ABNORMAL HIGH (ref 36.0–46.0)
Hemoglobin: 15.6 g/dL — ABNORMAL HIGH (ref 12.0–15.0)
Immature Granulocytes: 0 %
Lymphocytes Relative: 14 %
Lymphs Abs: 1 10*3/uL (ref 0.7–4.0)
MCH: 29.8 pg (ref 26.0–34.0)
MCHC: 32.6 g/dL (ref 30.0–36.0)
MCV: 91.4 fL (ref 80.0–100.0)
Monocytes Absolute: 0.5 10*3/uL (ref 0.1–1.0)
Monocytes Relative: 7 %
Neutro Abs: 5.8 10*3/uL (ref 1.7–7.7)
Neutrophils Relative %: 75 %
Platelets: 281 10*3/uL (ref 150–400)
RBC: 5.23 MIL/uL — ABNORMAL HIGH (ref 3.87–5.11)
RDW: 12.3 % (ref 11.5–15.5)
WBC: 7.7 10*3/uL (ref 4.0–10.5)
nRBC: 0 % (ref 0.0–0.2)

## 2023-08-16 LAB — HIV ANTIBODY (ROUTINE TESTING W REFLEX): HIV Screen 4th Generation wRfx: NONREACTIVE

## 2023-08-16 LAB — BASIC METABOLIC PANEL
Anion gap: 14 (ref 5–15)
BUN: 12 mg/dL (ref 8–23)
CO2: 27 mmol/L (ref 22–32)
Calcium: 9.9 mg/dL (ref 8.9–10.3)
Chloride: 101 mmol/L (ref 98–111)
Creatinine, Ser: 0.92 mg/dL (ref 0.44–1.00)
GFR, Estimated: >60 mL/min (ref >60)
Glucose, Bld: 103 mg/dL — ABNORMAL HIGH (ref 70–99)
Potassium: 4.3 mmol/L (ref 3.5–5.1)
Sodium: 142 mmol/L (ref 135–145)

## 2023-08-16 LAB — SARS CORONAVIRUS 2 BY RT PCR: SARS Coronavirus 2 by RT PCR: NEGATIVE

## 2023-08-16 MED ORDER — LIDOCAINE VISCOUS HCL 2 % MT SOLN
15.0000 mL | Freq: Once | OROMUCOSAL | Status: AC
  Administered 2023-08-16: 15 mL via OROMUCOSAL
  Filled 2023-08-16: qty 15

## 2023-08-16 MED ORDER — LIDOCAINE VISCOUS HCL 2 % MT SOLN: 15.0000 mL | OROMUCOSAL | 0 refills | Status: DC | PRN

## 2023-08-16 NOTE — Telephone Encounter (Signed)
Ok for OV next provider available or me, or UC or ED even if severe pain, thanks

## 2023-08-16 NOTE — Discharge Instructions (Signed)
You were seen in the emergency department for the ulcers in your mouth.  Your workup showed no signs of allergic reaction, dehydration, pneumonia and your COVID test was negative.  This is likely caused from a virus and we did test you for HIV and measles as well and if either of these come back positive you will receive a call.  It could also be from the virus from hand-foot-and-mouth.  It should resolve on its own.  You can use lidocaine oral solution to help with the pain and you can continue to take the antacids.  You should return to the emergency department if you are having worsening dehydration and passed out, you have swelling of your mouth and you are unable to swallow your own saliva or open your mouth or if you have any other new or concerning symptoms.

## 2023-08-16 NOTE — ED Provider Notes (Signed)
[place] EMERGENCY DEPARTMENT AT [facility] Provider Note   CSN: [id number] Arrival date & time: 08/16/23  1253     History  Chief Complaint  Patient presents with   Sore Throat    Denise Park is a 67 y.o. female.  Patient is a 68 year old female with a past medical history of depression and arthritis presenting to the emergency department with oral ulcers.  The patient states that last Thursday she started to feel feverish and had a sore throat.  She states that she also noted ulcers to her lips and in her mouth that were very painful.  She also reports that she was having increased acid reflux.  She states that she was seen at urgent care and tested negative for strep but was started on amoxicillin as well as an antifungal for possible thrush and antacid.  She states that the white patches on the tongue and the roof of her mouth have resolved and her acid reflux is improved however her ulcers have persisted.  She states that she feels like her throat is swollen.  She states that she has had a mild nonproductive cough.  She states that her fevers have essentially resolved.  She denies any rash anywhere else on her body.  She denies any eye redness or itching of her eyes.  She reports coworkers tested positive for COVID last week.  She denies any known measles exposures and states that she is up-to-date on her immunizations.  She denies any steroid or immunosuppressant use or history of HIV or cancer.  The history is provided by the patient.  Sore Throat       Home Medications Prior to Admission medications   Medication Sig Start Date End Date Taking? Authorizing Provider  lidocaine (XYLOCAINE) 2 % solution Use as directed 15 mLs in the mouth or throat as needed for mouth pain. 08/16/23  Yes Theresia Lo, [place] K, DO  ALPRAZolam Prudy Feeler) 0.25 MG tablet Take 1 tablet (0.25 mg total) by mouth 2 (two) times daily as needed for anxiety. Patient not taking: Reported on  08/12/2023 11/28/19   Corwin Levins, MD  buPROPion ER [facility] SR) 100 MG 12 hr tablet TAKE 1 TABLET BY MOUTH TWICE A DAY 06/28/23   Corwin Levins, MD  busPIRone (BUSPAR) 10 MG tablet TAKE 1 TABLET BY MOUTH THREE TIMES A DAY AS NEEDED Patient not taking: Reported on 08/12/2023 01/25/20   Corwin Levins, MD  CALCIUM-VITAMIN D PO Take by mouth daily. Patient not taking: Reported on 08/12/2023    [provider]  cetirizine (ZYRTEC) 10 MG tablet TAKE 1 TABLET(10 MG) BY MOUTH DAILY 10/02/21   Corwin Levins, MD  diclofenac Sodium (VOLTAREN) 1 % GEL Apply 2 g topically 4 (four) times daily. 05/21/23   Corwin Levins, MD  guaiFENesin (MUCINEX) 600 MG 12 hr tablet Take 2 tablets (1,200 mg total) by mouth 2 (two) times daily as needed. 05/21/23   Corwin Levins, MD  IMVEXXY MAINTENANCE PACK 4 MCG INST Place 1 capsule vaginally 2 (two) times a week. 03/25/20   [provider]  meloxicam (MOBIC) 15 MG tablet TAKE 1 TABLET BY MOUTH EVERY DAY AS NEEDED FOR PAIN 04/30/23   Corwin Levins, MD  rizatriptan (MAXALT-MLT) 10 MG disintegrating tablet Take 1 tablet (10 mg total) by mouth as needed for migraine. May repeat in 2 hours if needed Patient not taking: Reported on 08/12/2023 11/11/22   Corwin Levins, MD  traMADol Janean Sark)  50 MG tablet Take 1 tablet (50 mg total) by mouth every 6 (six) hours as needed. Patient not taking: Reported on 08/12/2023 02/13/22   Corwin Levins, MD  traMADol (ULTRAM) 50 MG tablet Take 1 tablet (50 mg total) by mouth every 6 (six) hours as needed. Patient not taking: Reported on 08/12/2023 11/11/22   Corwin Levins, MD  tretinoin (RETIN-A) 0.05 % cream APPLY TO AFFECTED AREA EVERY DAY AT BEDTIME    [provider]  triamcinolone (NASACORT) 55 MCG/ACT AERO nasal inhaler Place 2 sprays into the nose daily. 05/21/23   Corwin Levins, MD  zolpidem (AMBIEN) 10 MG tablet TAKE 1 TABLET BY MOUTH EVERY DAY AT BEDTIME AS NEEDED 11/11/22   Corwin Levins, MD      Allergies    Patient has  no known allergies.    Review of Systems   Review of Systems  Physical Exam Updated Vital Signs BP 131/86   Pulse 96   Temp 98 F (36.7 C)   Resp 20   Ht 5\' 7"  (1.702 m)   Wt 71.7 kg   LMP 03/31/2011   SpO2 95%   BMI 24.75 kg/m  Physical Exam Vitals and nursing note reviewed.  Constitutional:      General: She is not in acute distress.    Appearance: She is well-developed.  HENT:     Head: Normocephalic and atraumatic.     Mouth/Throat:     Mouth: Oral lesions (White ulcers with erythematous base around bilateral buccal mucosa, lips and posterior oropharynx) present.     Pharynx: Uvula midline. No pharyngeal swelling or uvula swelling.     Tonsils: No tonsillar exudate or tonsillar abscesses.     Comments: Dry lips otherwise mucous membranes moist No tonsillar swelling or exudates Normal phonation Tolerating secretions No trismus Eyes:     Conjunctiva/sclera: Conjunctivae normal.     Pupils: Pupils are equal, round, and reactive to light.  Cardiovascular:     Rate and Rhythm: Regular rhythm. Tachycardia present.     Heart sounds: Normal heart sounds.  Pulmonary:     Effort: Pulmonary effort is normal.     Breath sounds: Normal breath sounds.  Abdominal:     Palpations: Abdomen is soft.  Musculoskeletal:     Cervical back: Normal range of motion.  Lymphadenopathy:     Cervical: Cervical adenopathy present.  Skin:    General: Skin is warm and dry.     Findings: No rash (No lesions on palms or soles, no visible rash on arms or legs or face).  Neurological:     General: No focal deficit present.     Mental Status: She is alert and oriented to person, place, and time.  Psychiatric:        Mood and Affect: Mood normal.        Behavior: Behavior normal.     ED Results / Procedures / Treatments   Labs (all labs ordered are listed, but only abnormal results are displayed) Labs Reviewed  CBC WITH DIFFERENTIAL/PLATELET - Abnormal; Notable for the following  components:      Result Value   RBC 5.23 (*)    Hemoglobin 15.6 (*)    HCT 47.8 (*)    All other components within normal limits  BASIC METABOLIC PANEL - Abnormal; Notable for the following components:   Glucose, Bld 103 (*)    All other components within normal limits  SARS CORONAVIRUS 2 BY RT PCR  RUBELLA ANTIBODY,  IGM  RUBELLA SCREEN  HIV ANTIBODY (ROUTINE TESTING W REFLEX)    EKG EKG Interpretation Date/Time:  Monday August 16 2023 17:17:39 EDT Ventricular Rate:  92 PR Interval:  149 QRS Duration:  87 QT Interval:  349 QTC Calculation: 432 R Axis:   -67  Text Interpretation: Sinus rhythm Left anterior fascicular block Probable anteroseptal infarct, old No previous ECGs available Confirmed by Elayne Snare ([id number]) on 08/16/2023 6:15:49 PM  Radiology DG Chest 2 View  Result Date: 08/16/2023 CLINICAL DATA:  Cough, sore throat, sores in mouth, and lip swelling since last week. Patient is on treatment for oral thrush. EXAM: CHEST - 2 VIEW COMPARISON:  None Available. FINDINGS: The heart size and mediastinal contours are within normal limits. Both lungs are clear. The visualized skeletal structures are unremarkable. IMPRESSION: No active cardiopulmonary disease. Electronically Signed   By: Burman Nieves M.D.   On: 08/16/2023 17:57    Procedures Procedures    Medications Ordered in ED Medications  lidocaine (XYLOCAINE) 2 % viscous mouth solution 15 mL (15 mLs Mouth/Throat Given 08/16/23 1712)    ED Course/ Medical Decision Making/ A&P Clinical Course as of 08/16/23 1918  Mon Aug 16, 2023  1815 No signs of severe dehydration, no evidence of pneumonia. Patient recommended symptomatic treatment as ulcers likely from viral source and close primary care follow up. [VK]    Clinical Course User Index [VK] Rexford Maus, DO                                 Medical Decision Making This patient presents to the ED with chief complaint(s) of oral ulcers with pertinent  past medical history of depression, arthritis which further complicates the presenting complaint. The complaint involves an extensive differential diagnosis and also carries with it a high risk of complications and morbidity.    The differential diagnosis includes viral syndrome such as hand-foot-and-mouth, considering measles, considering possible HIV with recent thrush, dehydration, electrolyte abnormality, no evidence of PTA, RPA or other deep space infection, considering pneumonia, esophagitis  Additional history obtained: Additional history obtained from N/A Records reviewed Primary Care Documents  ED Course and Reassessment: On patient's arrival she was initially mildly tachycardic otherwise hemodynamically stable in no acute distress.  Due to patient's decreased oral intake due to her ulcer she will have labs to evaluate for dehydration or electrolyte abnormality as well as will be tested for measles and HIV.  She will do chest x-ray to evaluate for possible pneumonia or other cause of her cough.  She will be given viscous lidocaine for symptomatic management and will be closely reassessed.  She will be orally rehydrated.  Independent labs interpretation:  The following labs were independently interpreted: within normal range  Independent visualization of imaging: - I independently visualized the following imaging with scope of interpretation limited to determining acute life threatening conditions related to emergency care: CXR, which revealed no acute disease  Consultation: - Consulted or discussed management/test interpretation w/ external professional: N/A  Consideration for admission or further workup: Patient has no emergent conditions requiring admission or further work-up at this time and is stable for discharge home with primary care follow-up  Social Determinants of health: N/A    Amount and/or Complexity of Data Reviewed Labs: ordered. Radiology:  ordered.  Risk Prescription drug management.          Final Clinical Impression(s) / ED Diagnoses Final diagnoses:  Recurrent oral  ulcers  Viral syndrome    Rx / DC Orders ED Discharge Orders          Ordered    lidocaine (XYLOCAINE) 2 % solution  As needed        08/16/23 1908              Rexford Maus, DO 08/16/23 1918

## 2023-08-16 NOTE — ED Triage Notes (Signed)
Sore throat, sores in mouth, and lip swelling since last week, treated for strep even though strep was negative. Pt is on amoxicillin and fluconazole for oral thrush. Pt states she feels like she has a lump in her throat and is having painful swallowing.

## 2023-08-17 ENCOUNTER — Telehealth: Payer: Self-pay

## 2023-08-17 LAB — RUBELLA ANTIBODY, IGM: Rubella IgM: <20 [AU]/ml (ref 0.0–19.9)

## 2023-08-17 LAB — RUBELLA SCREEN: Rubella: 14.1 {index} (ref >0.99)

## 2023-08-17 NOTE — Transitions of Care (Post Inpatient/ED Visit) (Signed)
08/17/2023  Name: Denise Park MRN: [id number] DOB: Mar 02, 1956  Today's TOC FU Call Status: Today's TOC FU Call Status:: Successful TOC FU Call Completed TOC FU Call Complete Date: 08/17/23 Patient's Name and Date of Birth confirmed.  Transition Care Management Follow-up Telephone Call Date of Discharge: 08/16/23 Discharge Facility: [facility]) Type of Discharge: Emergency Department Reason for ED Visit: Other: (ulcer) How have you been since you were released from the hospital?: Same Any questions or concerns?: No  Items Reviewed: Did you receive and understand the discharge instructions provided?: Yes Medications obtained,verified, and reconciled?: Yes (Medications Reviewed) Any new allergies since your discharge?: No Dietary orders reviewed?: Yes Do you have support at home?: No  Medications Reviewed Today: Medications Reviewed Today     Reviewed by Karena Addison, LPN (Licensed Practical Nurse) on 08/17/23 at 1045  Med List Status: <None>   Medication Order Taking? Sig Documenting Provider Last Dose Status Informant  ALPRAZolam (XANAX) 0.25 MG tablet [id number] No Take 1 tablet (0.25 mg total) by mouth 2 (two) times daily as needed for anxiety.  Patient not taking: Reported on 08/12/2023   Corwin Levins, MD Not Taking Active   buPROPion ER [facility] SR) 100 MG 12 hr tablet [id number] No TAKE 1 TABLET BY MOUTH TWICE A DAY Corwin Levins, MD Taking Active   busPIRone (BUSPAR) 10 MG tablet [id number] No TAKE 1 TABLET BY MOUTH THREE TIMES A DAY AS NEEDED  Patient not taking: Reported on 08/12/2023   Corwin Levins, MD Not Taking Active   CALCIUM-VITAMIN D PO [id number] No Take by mouth daily.  Patient not taking: Reported on 08/12/2023   [provider] Not Taking Active   cetirizine (ZYRTEC) 10 MG tablet [id number] No TAKE 1 TABLET(10 MG) BY MOUTH DAILY Corwin Levins, MD Taking Active   diclofenac Sodium (VOLTAREN) 1 % GEL [id number] No Apply 2 g topically 4 (four)  times daily. Corwin Levins, MD Taking Active   guaiFENesin (MUCINEX) 600 MG 12 hr tablet [id number] No Take 2 tablets (1,200 mg total) by mouth 2 (two) times daily as needed. Corwin Levins, MD Taking Active   [facility] MAINTENANCE PACK 4 MCG INST [id number] No Place 1 capsule vaginally 2 (two) times a week. [provider] Taking Active   lidocaine (XYLOCAINE) 2 % solution [id number]  Use as directed 15 mLs in the mouth or throat as needed for mouth pain. Rexford Maus, DO  Active   meloxicam (MOBIC) 15 MG tablet [id number] No TAKE 1 TABLET BY MOUTH EVERY DAY AS NEEDED FOR PAIN Corwin Levins, MD Taking Active   rizatriptan (MAXALT-MLT) 10 MG disintegrating tablet [id number] No Take 1 tablet (10 mg total) by mouth as needed for migraine. May repeat in 2 hours if needed  Patient not taking: Reported on 08/12/2023   Corwin Levins, MD Not Taking Active   traMADol (ULTRAM) 50 MG tablet [id number] No Take 1 tablet (50 mg total) by mouth every 6 (six) hours as needed.  Patient not taking: Reported on 08/12/2023   Corwin Levins, MD Not Taking Active   traMADol Janean Sark) 50 MG tablet [id number] No Take 1 tablet (50 mg total) by mouth every 6 (six) hours as needed.  Patient not taking: Reported on 08/12/2023   Corwin Levins, MD Not Taking Active   tretinoin (RETIN-A) 0.05 % cream [id number] No APPLY TO AFFECTED AREA EVERY DAY AT BEDTIME [provider] Taking Active   triamcinolone (NASACORT) 55 MCG/ACT AERO  nasal inhaler [id number] No Place 2 sprays into the nose daily. Corwin Levins, MD Taking Active   zolpidem [facility]) 10 MG tablet [id number] No TAKE 1 TABLET BY MOUTH EVERY DAY AT BEDTIME AS NEEDED Corwin Levins, MD Taking Active             Home Care and Equipment/Supplies: Were Home Health Services Ordered?: NA Any new equipment or medical supplies ordered?: NA  Functional Questionnaire: Do you need assistance with bathing/showering or dressing?: No Do you need assistance with meal  preparation?: No Do you need assistance with eating?: No Do you have difficulty maintaining continence: No Do you need assistance with getting out of bed/getting out of a chair/moving?: No Do you have difficulty managing or taking your medications?: No  Follow up appointments reviewed: PCP Follow-up appointment confirmed?: No (declined) MD Provider Line Number:[phone] Given: No Specialist Hospital Follow-up appointment confirmed?: NA Do you need transportation to your follow-up appointment?: No Do you understand care options if your condition(s) worsen?: Yes-patient verbalized understanding    SIGNATURE Karena Addison, LPN [facility] Nurse Health Advisor Direct Dial [phone]

## 2023-08-18 ENCOUNTER — Encounter: Payer: 59 | Admitting: Internal Medicine

## 2023-08-18 NOTE — Patient Instructions (Incomplete)
    Steroid injection given.     Have blood done today.    Medications changes include :  valtrex 1 gm 2 times a day, magic mouthwash      Return if symptoms worsen or fail to improve.

## 2023-08-18 NOTE — Progress Notes (Unsigned)
Subjective:    Patient ID: Denise Park, female    DOB: [birth year], 67 y.o.   MRN: [id number]      HPI Denise Park is here for No chief complaint on file.    8/26 - ED - sore throat, oral ulcers. One week ago today she felt feverish and had sore throat.  She had ulcers on her lips and in her mouth that were very painful.  She had increased GERD. She had white patches on roof of mouth and tongue.  Had gone to urgent care - negative for strep, but started on amoxicillin and antifungal, antacid.  GERD and white patches have improved, but ulcers have persisted.  Her throat feels swollen. She has a mild dry cough.  Fevers have resolved.   No rash.    Tests for hiv, covid rubella, cxr neg.  Dx with viral illness and oral ulcers.   Rx'd lidocaine      Medications and allergies reviewed with patient and updated if appropriate.  Current Outpatient Medications on File Prior to Visit  Medication Sig Dispense Refill   ALPRAZolam (XANAX) 0.25 MG tablet Take 1 tablet (0.25 mg total) by mouth 2 (two) times daily as needed for anxiety. (Patient not taking: Reported on 08/12/2023) 60 tablet 2   buPROPion ER (WELLBUTRIN SR) 100 MG 12 hr tablet TAKE 1 TABLET BY MOUTH TWICE A DAY 180 tablet 2   busPIRone (BUSPAR) 10 MG tablet TAKE 1 TABLET BY MOUTH THREE TIMES A DAY AS NEEDED (Patient not taking: Reported on 08/12/2023) 270 tablet 1   CALCIUM-VITAMIN D PO Take by mouth daily. (Patient not taking: Reported on 08/12/2023)     cetirizine (ZYRTEC) 10 MG tablet TAKE 1 TABLET(10 MG) BY MOUTH DAILY 90 tablet 3   diclofenac Sodium (VOLTAREN) 1 % GEL Apply 2 g topically 4 (four) times daily. 100 g 5   guaiFENesin (MUCINEX) 600 MG 12 hr tablet Take 2 tablets (1,200 mg total) by mouth 2 (two) times daily as needed. 60 tablet 5   IMVEXXY MAINTENANCE PACK 4 MCG INST Place 1 capsule vaginally 2 (two) times a week.     lidocaine (XYLOCAINE) 2 % solution Use as directed 15 mLs in the mouth or throat as needed for mouth  pain. 100 mL 0   meloxicam (MOBIC) 15 MG tablet TAKE 1 TABLET BY MOUTH EVERY DAY AS NEEDED FOR PAIN 90 tablet 2   rizatriptan (MAXALT-MLT) 10 MG disintegrating tablet Take 1 tablet (10 mg total) by mouth as needed for migraine. May repeat in 2 hours if needed (Patient not taking: Reported on 08/12/2023) 10 tablet 5   traMADol (ULTRAM) 50 MG tablet Take 1 tablet (50 mg total) by mouth every 6 (six) hours as needed. (Patient not taking: Reported on 08/12/2023) 120 tablet 3   traMADol (ULTRAM) 50 MG tablet Take 1 tablet (50 mg total) by mouth every 6 (six) hours as needed. (Patient not taking: Reported on 08/12/2023) 120 tablet 0   tretinoin (RETIN-A) 0.05 % cream APPLY TO AFFECTED AREA EVERY DAY AT BEDTIME     triamcinolone (NASACORT) 55 MCG/ACT AERO nasal inhaler Place 2 sprays into the nose daily. 1 each 12   zolpidem (AMBIEN) 10 MG tablet TAKE 1 TABLET BY MOUTH EVERY DAY AT BEDTIME AS NEEDED 90 tablet 1   No current facility-administered medications on file prior to visit.    Review of Systems     Objective:  There were no vitals filed for this visit. BP Readings from  Last 3 Encounters:  08/16/23 131/86  05/21/23 122/76  05/21/23 122/76   Wt Readings from Last 3 Encounters:  08/16/23 158 lb (71.7 kg)  08/12/23 158 lb (71.7 kg)  05/21/23 160 lb (72.6 kg)   There is no height or weight on file to calculate BMI.    Physical Exam         Assessment & Plan:    See Problem List for Assessment and Plan of chronic medical problems.

## 2023-08-19 ENCOUNTER — Ambulatory Visit: Payer: 59 | Admitting: Internal Medicine

## 2023-08-19 VITALS — BP 116/72 | HR 80 | Temp 98.4°F | Ht 67.0 in | Wt 154.0 lb

## 2023-08-19 DIAGNOSIS — K121 Other forms of stomatitis: Secondary | ICD-10-CM

## 2023-08-19 MED ORDER — METHYLPREDNISOLONE ACETATE 80 MG/ML IJ SUSP
80.0000 mg | Freq: Once | INTRAMUSCULAR | Status: AC
Start: 2023-08-19 — End: 2023-08-19
  Administered 2023-08-19: 80 mg via INTRAMUSCULAR

## 2023-08-19 MED ORDER — VALACYCLOVIR HCL 1 G PO TABS: 1000.0000 mg | ORAL_TABLET | Freq: Two times a day (BID) | ORAL | 0 refills | Status: AC

## 2023-08-19 MED ORDER — NYSTATIN 100000 UNIT/ML MT SUSP: 5.0000 mL | Freq: Four times a day (QID) | OROMUCOSAL | 0 refills | Status: AC | PRN

## 2023-08-20 LAB — HSV 2 ANTIBODY, IGG: HSV 2 Glycoprotein G Ab, IgG: 7.46 {index} — ABNORMAL HIGH

## 2023-08-20 LAB — HSV 1 ANTIBODY, IGG: HSV 1 Glycoprotein G Ab, IgG: 7.7 {index} — ABNORMAL HIGH

## 2023-08-31 ENCOUNTER — Encounter: Payer: Self-pay | Admitting: Family Medicine

## 2023-08-31 ENCOUNTER — Ambulatory Visit: Payer: 59 | Admitting: Family Medicine

## 2023-08-31 VITALS — BP 112/64 | HR 71 | Temp 98.3°F | Resp 18 | Wt 153.4 lb

## 2023-08-31 DIAGNOSIS — K121 Other forms of stomatitis: Secondary | ICD-10-CM

## 2023-08-31 DIAGNOSIS — J029 Acute pharyngitis, unspecified: Secondary | ICD-10-CM

## 2023-08-31 DIAGNOSIS — B009 Herpesviral infection, unspecified: Secondary | ICD-10-CM | POA: Diagnosis not present

## 2023-08-31 MED ORDER — VALACYCLOVIR HCL 1 G PO TABS
1000.0000 mg | ORAL_TABLET | Freq: Two times a day (BID) | ORAL | 0 refills | Status: AC
Start: 2023-08-31 — End: 2023-09-10

## 2023-08-31 MED ORDER — PREDNISONE 10 MG PO TABS: ORAL_TABLET | ORAL | 0 refills | Status: AC

## 2023-08-31 NOTE — Progress Notes (Signed)
Acute Office Visit   Subjective:  Patient ID: Denise Park, female    DOB: 1956-02-14, 67 y.o.   MRN: [id number]  Chief Complaint  Patient presents with   Mouth Lesions    Patient states sores are inside mouth, on cheek, tongue. About 2 weeks, this is the 4th time to be seen for this. Has been treated for strep with amoxicillin, also acid reflux    HPI:  Patient is here for an acute visit with complaints of sores inside her mouth, cheeks, and tongue. Patient has been seen recently for the same, continuous complaint.   Initially seen [facility] with Blondell Reveal, NP, diagnosed with acute tonsillitis, GERD, and candidiasis of mouth. Prescribed Amoxicillin, Diflucan, Ceftriaxone IM injection, Famotidine 20mg , and Benzonatate Capsule. Then, she was seen at [facility] ED at [facility] on 08/26. She was diagnosed with recurrent oral ulcers and viral syndrome. She was prescribed Lidocaine. She was negative for rubella, HIV, and CBC was stable. Last visit was on 08/29 with Dr. Cheryll Cockayne with [facility]. She was positive for HSV 1 and HSV 2. She was prescribed magic mouthwash and Valacyclovir.   She reports medication helped some, but then her symptoms of oral ulcerations and sore throat started back over the weekend.   ROS See HPI above     Objective:   BP 112/64   Pulse 71   Temp 98.3 F (36.8 C) (Temporal)   Resp 18   Wt 153 lb 6 oz (69.6 kg)   LMP 03/31/2011   SpO2 97%   BMI 24.02 kg/m    Physical Exam Vitals reviewed.  Constitutional:      General: She is not in acute distress.    Appearance: Normal appearance. She is not ill-appearing, toxic-appearing or diaphoretic.  HENT:     Head: Normocephalic and atraumatic.     Mouth/Throat:     Mouth: Mucous membranes are moist. Oral lesions present.     Tongue: Lesions present.     Pharynx: Oropharynx is clear. Uvula midline. No pharyngeal swelling, oropharyngeal exudate,  posterior oropharyngeal erythema or uvula swelling.  Eyes:     General:        Right eye: No discharge.        Left eye: No discharge.     Conjunctiva/sclera: Conjunctivae normal.  Cardiovascular:     Rate and Rhythm: Normal rate and regular rhythm.     Heart sounds: Normal heart sounds. No murmur heard.    No friction rub. No gallop.  Pulmonary:     Effort: Pulmonary effort is normal. No respiratory distress.     Breath sounds: Normal breath sounds.  Musculoskeletal:        General: Normal range of motion.  Lymphadenopathy:     Head:     Right side of head: No submental or submandibular adenopathy.     Left side of head: No submental or submandibular adenopathy.  Skin:    General: Skin is warm and dry.  Neurological:     General: No focal deficit present.     Mental Status: She is alert and oriented to person, place, and time. Mental status is at baseline.  Psychiatric:        Mood and Affect: Mood normal.        Behavior: Behavior normal.        Thought Content: Thought content normal.        Judgment: Judgment normal.  Assessment & Plan:  Oral ulceration -     predniSONE; Take 6 tablets (60 mg total) by mouth daily with breakfast for 1 day, THEN 5 tablets (50 mg total) daily with breakfast for 1 day, THEN 4 tablets (40 mg total) daily with breakfast for 1 day, THEN 3 tablets (30 mg total) daily with breakfast for 1 day, THEN 2 tablets (20 mg total) daily with breakfast for 1 day, THEN 1 tablet (10 mg total) daily with breakfast for 1 day.  Dispense: 21 tablet; Refill: 0 -     Ambulatory referral to ENT -     valACYclovir HCl; Take 1 tablet (1,000 mg total) by mouth 2 (two) times daily for 10 days.  Dispense: 20 tablet; Refill: 0  HSV-1 infection -     valACYclovir HCl; Take 1 tablet (1,000 mg total) by mouth 2 (two) times daily for 10 days.  Dispense: 20 tablet; Refill: 0  Sore throat -     predniSONE; Take 6 tablets (60 mg total) by mouth daily with breakfast for 1  day, THEN 5 tablets (50 mg total) daily with breakfast for 1 day, THEN 4 tablets (40 mg total) daily with breakfast for 1 day, THEN 3 tablets (30 mg total) daily with breakfast for 1 day, THEN 2 tablets (20 mg total) daily with breakfast for 1 day, THEN 1 tablet (10 mg total) daily with breakfast for 1 day.  Dispense: 21 tablet; Refill: 0 -     Ambulatory referral to ENT  -Prescribed Valtrex 1,000mg  tablet, take 1 tablet twice a day for 10 days for HSV-1 and oral ulcerations. -Prescribed Prednisone 10mg , 6 day taper for sore throat and oral ulceration.  -Placed a referral to ENT for oral ulcerations and sore throat.  -Follow up with primary care if not improved.   Zandra Abts, NP

## 2023-08-31 NOTE — Patient Instructions (Addendum)
-  Prescribed Valtrex 1,000mg  tablet, take 1 tablet twice a day for 10 days for HSV-1 and oral ulcerations. -Prescribed Prednisone 10mg , 6 day taper for sore throat and oral ulceration.  -Placed a referral to ENT for oral ulcerations and sore throat.  -Follow up with primary care if not improved.

## 2023-09-02 ENCOUNTER — Ambulatory Visit: Payer: 59 | Admitting: Family Medicine

## 2023-09-13 ENCOUNTER — Other Ambulatory Visit: Payer: Self-pay | Admitting: Internal Medicine

## 2023-10-06 ENCOUNTER — Encounter: Payer: Self-pay | Admitting: Internal Medicine

## 2023-10-06 ENCOUNTER — Other Ambulatory Visit: Payer: Self-pay

## 2023-10-06 MED ORDER — FAMOTIDINE 20 MG PO TABS: 20.0000 mg | ORAL_TABLET | Freq: Two times a day (BID) | ORAL | 0 refills | Status: DC

## 2023-10-19 ENCOUNTER — Ambulatory Visit (INDEPENDENT_AMBULATORY_CARE_PROVIDER_SITE_OTHER): Payer: 59 | Admitting: Otolaryngology

## 2023-10-19 ENCOUNTER — Encounter (INDEPENDENT_AMBULATORY_CARE_PROVIDER_SITE_OTHER): Payer: Self-pay

## 2023-10-19 VITALS — Wt 153.0 lb

## 2023-10-19 DIAGNOSIS — R1310 Dysphagia, unspecified: Secondary | ICD-10-CM | POA: Diagnosis not present

## 2023-10-19 DIAGNOSIS — J029 Acute pharyngitis, unspecified: Secondary | ICD-10-CM

## 2023-10-19 DIAGNOSIS — K1379 Other lesions of oral mucosa: Secondary | ICD-10-CM | POA: Diagnosis not present

## 2023-10-19 MED ORDER — VALACYCLOVIR HCL 1 G PO TABS: 1000.0000 mg | ORAL_TABLET | Freq: Two times a day (BID) | ORAL | 0 refills | Status: DC

## 2023-10-19 MED ORDER — TRIAMCINOLONE ACETONIDE 0.5 % EX CREA: TOPICAL_CREAM | Freq: Two times a day (BID) | CUTANEOUS | 0 refills | Status: DC

## 2023-10-19 NOTE — Progress Notes (Signed)
Dear Dr. Clinton Sawyer, Here is my assessment for our mutual patient, Denise Park. Thank you for allowing me the opportunity to care for your patient. Please do not hesitate to contact me should you have any other questions. Sincerely, Dr. Jovita Kussmaul  Otolaryngology Clinic Note Referring provider: Dr. Clinton Sawyer HPI:  Denise Park is a 67 y.o. female kindly referred by Dr. Clinton Sawyer for evaluation of mouth sores and sore throat. Has had mouth tenderness for a while - spicy foods trigger pain/discomfort. Recent acid reflux with significant symptoms, then started to have sore throat and some white patches on her tongue (painful). Went to urgent care, treated for strep and candidiasis (negative for strep though) with amoxicillin (empiric for strep throat), and diflucan.   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 Took prednisone, and valacyclovir (x2) - finished about 6 weeks ago. Went away and then potentially came back so got another course of antivirals and things are much better overall. Still feels some discomfort on undersurface of tongue  No other ulcers anywhere else; no history of oral ulcers. Does not smoke, no tobacco use, no autoimmune disease history. No joint pain, rash, fevers. No inhalers.  Denies ear pain, odynophagia, hemoptysis. Weight loss. Dysphagia with solids - intermittent and some sore throat as well (discomfort, not sharp pain) Has been having problems with acid reflux and was not taking medications.  Has a scheduled endo in GI next week. Does have some globus and dysphagia to solids. No overt aspiration episodes. No voice changes.  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  PMH/Meds/All/SocHx/FamHx/ROS:   Past Medical History:  Diagnosis Date   ALLERGIC RHINITIS 10/30/2007   ANEMIA-IRON DEFICIENCY 10/30/2007   ANXIETY 10/30/2007   Arthritis    BACK PAIN 02/13/2008   Cataract    COMMON MIGRAINE 10/30/2007   DEPRESSION 10/30/2007   GERD 10/30/2007   HYPERSOMNIA 09/11/2008   INSOMNIA-SLEEP DISORDER-UNSPEC 09/11/2008   IRRITABLE BOWEL SYNDROME, HX OF 10/30/2007   OSTEOPENIA 02/13/2008   Osteopenia 05/22/2011   Osteoporosis    PELVIC PAIN, CHRONIC 05/14/2009   SINUSITIS- ACUTE-NOS 02/13/2008     Past Surgical History:  Procedure Laterality Date   APPENDECTOMY     age 63   CESAREAN SECTION     TUBAL LIGATION      Family History  Problem Relation Age of Onset   Coronary artery disease Other        Female 1st degree relative, female 1st degree relative <60 sister   Depression Other    Colon polyps Neg Hx    Colon cancer Neg Hx    Esophageal cancer Neg Hx    Rectal cancer Neg Hx    Stomach cancer Neg Hx      Social Connections: Moderately Isolated (08/19/2023)   Social Connection and Isolation Panel [NHANES]    Frequency of Communication with Friends and Family: More than three times a week    Frequency of Social Gatherings with Friends and Family: More than three times a week    Attends Religious Services: 1 to 4 times per year    Active Member of Clubs or  Organizations: No    Attends Engineer, structural: Not on file    Marital Status: Separated    Tobacco: denies   Current Outpatient Medications:    ALPRAZolam (XANAX) 0.25 MG tablet, Take 1 tablet (0.25 mg total) by mouth 2 (two) times daily as needed for anxiety., Disp: 60 tablet, Rfl: 2   buPROPion ER (WELLBUTRIN SR) 100 MG 12 hr tablet, TAKE 1 TABLET BY MOUTH TWICE A DAY, Disp: 180 tablet, Rfl: 2   busPIRone (BUSPAR) 10 MG tablet, TAKE 1 TABLET BY MOUTH THREE TIMES A DAY AS NEEDED, Disp: 270  tablet, Rfl: 1   CALCIUM-VITAMIN D PO, Take by mouth daily., Disp: , Rfl:    diclofenac Sodium (VOLTAREN) 1 % GEL, Apply 2 g topically 4 (four) times daily., Disp: 100 g, Rfl: 5   famotidine (PEPCID) 20 MG tablet, Take 1 tablet (20 mg total) by mouth 2 (two) times daily., Disp: 60 tablet, Rfl: 0   meloxicam (MOBIC) 15 MG tablet, TAKE 1 TABLET BY MOUTH EVERY DAY AS NEEDED FOR PAIN, Disp: 90 tablet, Rfl: 2   tretinoin (RETIN-A) 0.05 % cream, APPLY TO AFFECTED AREA EVERY DAY AT BEDTIME, Disp: , Rfl:    zolpidem (AMBIEN) 10 MG tablet, TAKE 1 TABLET BY MOUTH EVERY DAY AT BEDTIME AS NEEDED, Disp: 90 tablet, Rfl: 1   cetirizine (ZYRTEC) 10 MG tablet, TAKE 1 TABLET(10 MG) BY MOUTH DAILY (Patient not taking: Reported on 08/31/2023), Disp: 90 tablet, Rfl: 3   guaiFENesin (MUCINEX) 600 MG 12 hr tablet, Take 2 tablets (1,200 mg total) by mouth 2 (two) times daily as needed. (Patient not taking: Reported on 08/31/2023), Disp: 60 tablet, Rfl: 5   IMVEXXY MAINTENANCE PACK 4 MCG INST, Place 1 capsule vaginally 2 (two) times a week., Disp: , Rfl:    lidocaine (XYLOCAINE) 2 % solution, Use as directed 15 mLs in the mouth or throat as needed for mouth pain. (Patient not taking: Reported on 08/31/2023), Disp: 100 mL, Rfl: 0   traMADol (ULTRAM) 50 MG tablet, Take 1 tablet (50 mg total) by mouth every 6 (six) hours as needed. (Patient not taking: Reported on 08/31/2023), Disp: 120 tablet, Rfl: 3   traMADol (ULTRAM) 50 MG tablet, Take 1 tablet (50 mg total) by mouth every 6 (six) hours as needed. (Patient not taking: Reported on 08/12/2023), Disp: 120 tablet, Rfl: 0   triamcinolone (NASACORT) 55 MCG/ACT AERO nasal inhaler, Place 2 sprays into the nose daily. (Patient not taking: Reported on 08/31/2023), Disp: 1 each, Rfl: 12   Physical Exam:   Wt 153 lb (69.4 kg)   LMP 03/31/2011   BMI 23.96 kg/m    Salient findings:  CN II-XII intact  Bilateral EAC clear and TM intact with well pneumatized middle ear spaces Anterior  rhinoscopy: Septum relatively midline; bilateral inferior turbinates without significant hypertrophy No lesions of oropharynx; dentition good There are two small white patches which are non-tender on bilateral lateral undersurface of tongue (see below). Does not scrape off; not painful. No other ulcers noted but patient reports that is where she noted the ulcers. Do not see any evidence of lichen planus  No obviously palpable neck masses/lymphadenopathy/thyromegaly No respiratory distress or stridor    Procedures:  Procedure Note Pre-procedure diagnosis: Chronic sore throat, LPR, dysphagia Post-procedure diagnosis: Same Procedure: Transnasal Fiberoptic Laryngoscopy, CPT 40981 - Mod 25 Indication: chronic sore throat, LPR, dysphagia Complications: None apparent EBL: 0 mL Date: 10/22/23   The procedure was undertaken to further evaluate the patient's  complaint of chronic sore throat, LPR, dysphagia, with mirror exam inadequate for appropriate examination due to gag reflex and poor patient tolerance  Procedure:  Patient was identified as correct patient. Verbal consent was obtained. The nose was sprayed with oxymetazoline and 4% lidocaine. The The flexible laryngoscope was passed through the nose to view the nasal cavity, pharynx (oropharynx, hypopharynx) and larynx.  The larynx was examined at rest and during multiple phonatory tasks. Documentation was obtained and reviewed with patient. The scope was removed. The patient tolerated the procedure well.  Findings: The nasal cavity and nasopharynx did not reveal any masses or lesions, mucosa appeared to be without obvious lesions. The tongue base, pharyngeal walls, piriform sinuses, vallecula, epiglottis and postcricoid region are normal in appearance without any identified foreign bodies or lesions; minimal pyriform secretions. The visualized portion of the subglottis and proximal trachea is widely patent. The vocal folds are mobile bilaterally.  There are no lesions on the free edge of the vocal folds nor elsewhere in the larynx worrisome for malignancy.     Electronically signed by: Read Drivers, MD 10/22/2023 10:41 AM   Impression & Plans:  Albina Gosney is a 67 y.o. female with: Recurrent oral ulcers - painful; there appears to be a small area on lateral tongue which does not scrape off; does not look like candida. She has had prior workup and treatment for this with PO antibiotics and antivirals. Antivirals seemed to have helped but I am not sure if HSV is the cause of this. She does not have ulcers anywhere else and Autoimmune causes and no Rfx for immunocompromised status. Query lichen planus but does not seem to be the case. Could just be healing oral aphthous ulcer but less likely on the list given appearance on lesion today. Can be leukoplakia or dysplastic lesion but malignancy seems unlikely currently - We discussed empiric management with steroid cream (see below). Since antivirals helped, we can try another short course - If no resolution, will biopsy at next visit - Good oral care 2. Sore throat, dysphagia - TFL reassuring, suspect GERD or LPR. She has an upcoming appt with GI so will defer to them   - f/u in 3-4 weeks   Thank you for allowing me the opportunity to care for your patient. Please do not hesitate to contact me should you have any other questions.  Sincerely, Jovita Kussmaul, MD Otolarynoglogist (ENT), [facility] Phone: [phone] Fax: [phone]  10/19/2023, 2:35 PM   I have personally spent 48 minutes involved in face-to-face and non-face-to-face activities for this patient on the day of the visit.  Professional time spent includes the following activities, in addition to those noted in the documentation: preparing to see the patient (review of outside documentation and results), performing a medically appropriate examination and/or evaluation, counseling and educating the  patient/family/caregiver, ordering medications, performing procedures (TFL) referring and communicating with other healthcare professionals, documenting clinical information in the electronic or other health record, independently interpreting results and communicating results with the patient

## 2023-10-27 ENCOUNTER — Ambulatory Visit: Payer: 59 | Admitting: Physician Assistant

## 2023-10-27 ENCOUNTER — Encounter: Payer: Self-pay | Admitting: Physician Assistant

## 2023-10-27 VITALS — BP 110/70 | HR 85 | Ht 67.0 in | Wt 147.0 lb

## 2023-10-27 DIAGNOSIS — K121 Other forms of stomatitis: Secondary | ICD-10-CM

## 2023-10-27 DIAGNOSIS — Z1211 Encounter for screening for malignant neoplasm of colon: Secondary | ICD-10-CM | POA: Diagnosis not present

## 2023-10-27 DIAGNOSIS — R1319 Other dysphagia: Secondary | ICD-10-CM | POA: Diagnosis not present

## 2023-10-27 DIAGNOSIS — K219 Gastro-esophageal reflux disease without esophagitis: Secondary | ICD-10-CM

## 2023-10-27 MED ORDER — NA SULFATE-K SULFATE-MG SULF 17.5-3.13-1.6 GM/177ML PO SOLN: 1.0000 | Freq: Once | ORAL | 0 refills | Status: AC

## 2023-10-27 NOTE — Progress Notes (Signed)
Subjective:    Patient ID: Denise Park, female    DOB: Sep 02, 1956, 67 y.o.   MRN: [id number]  HPI Denise Park is a pleasant 67 year old white female, established with Dr. Leone Payor.  She comes in today to discuss repeat screening colonoscopy, and also has been having issues with dysphagia. She had very remotely had an upper endoscopy per Dr. Victorino Dike in 2003, at that time had evidence of Candida esophagitis and reflux esophagitis.  Last colonoscopy was done in 2012 per Dr. Leone Payor for screening and had a diminutive rectosigmoid polyp removed which was hyperplastic and was noted to have internal hemorrhoids and therefore indicated for 10-year interval follow-up.  She is currently not having any lower GI issues, no problems with abdominal pain, changes in bowel habits, no melena or hematochezia and no issues with constipation or diarrhea generally. She says that she in the past had been able to control any reflux symptoms with dietary changes, she says she quit smoking several years ago changed her diet and then stop drinking alcohol for the most part and really did not require any medicines to treat acid reflux. This past summer she started having problems with heartburn and indigestion and burning sensation in her chest.  She was started on famotidine by primary care 20 mg p.o. twice daily and says that that has helped her symptoms but if she runs out then symptoms will come right back.  She has been to the ear nose and throat specialist because she also developed ulcerations in her mouth which had been persisting.  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  She has had improvement in symptoms with Valtrex but says that she still has some little ulcers under her tongue and that her symptoms have not completely resolved.   She also developed issues with dysphagia.  She says that when she was trying to eat she would get a choking sensation with  swallowing and felt like she had some swelling in her esophagus or throat.  She feels like food is "sticking" in her esophagus and has had episodes requiring her to regurgitate.  She also gets a choking sensation at times with solid foods.  When her symptoms were at their worst she was having some similar issues with liquids but that seems to have improved.  She She did lose about 8 pounds during the worst of the symptoms as she was eating less due to the difficulty with dysphagia.    Review of Systems Pertinent positive and negative review of systems were noted in the above HPI section.  All other review of systems was otherwise negative.   Outpatient Encounter Medications as of 10/27/2023  Medication Sig   ALPRAZolam (XANAX) 0.25 MG tablet Take 1 tablet (0.25 mg total) by mouth 2 (two) times daily as needed for anxiety.   buPROPion ER (WELLBUTRIN SR) 100 MG 12 hr tablet TAKE 1 TABLET BY MOUTH TWICE A DAY   busPIRone (BUSPAR) 10 MG tablet TAKE 1 TABLET BY MOUTH THREE TIMES A DAY AS NEEDED   CALCIUM-VITAMIN D PO Take by mouth daily.   cetirizine (ZYRTEC) 10 MG tablet TAKE 1 TABLET(10 MG) BY MOUTH DAILY   diclofenac Sodium (VOLTAREN) 1 % GEL Apply 2 g topically 4 (four) times daily.   famotidine (PEPCID) 20 MG tablet Take 1 tablet (20 mg total) by mouth 2 (two) times daily.   IMVEXXY MAINTENANCE PACK 4 MCG INST Place 1 capsule vaginally 2 (  two) times a week.   meloxicam (MOBIC) 15 MG tablet TAKE 1 TABLET BY MOUTH EVERY DAY AS NEEDED FOR PAIN   Na Sulfate-K Sulfate-Mg Sulf 17.5-3.13-1.6 GM/177ML SOLN Take 1 kit by mouth once for 1 dose.   tretinoin (RETIN-A) 0.05 % cream APPLY TO AFFECTED AREA EVERY DAY AT BEDTIME   triamcinolone (NASACORT) 55 MCG/ACT AERO nasal inhaler Place 2 sprays into the nose daily.   triamcinolone cream (KENALOG) 0.5 % Apply topically 2 (two) times daily. Apply to areas under the tongue twice daily for 2 weeks   valACYclovir (VALTREX) 1000 MG tablet Take 1 tablet (1,000 mg  total) by mouth 2 (two) times daily for 10 days.   zolpidem (AMBIEN) 10 MG tablet TAKE 1 TABLET BY MOUTH EVERY DAY AT BEDTIME AS NEEDED   [DISCONTINUED] guaiFENesin (MUCINEX) 600 MG 12 hr tablet Take 2 tablets (1,200 mg total) by mouth 2 (two) times daily as needed. (Patient not taking: Reported on 08/31/2023)   [DISCONTINUED] lidocaine (XYLOCAINE) 2 % solution Use as directed 15 mLs in the mouth or throat as needed for mouth pain. (Patient not taking: Reported on 08/31/2023)   [DISCONTINUED] traMADol (ULTRAM) 50 MG tablet Take 1 tablet (50 mg total) by mouth every 6 (six) hours as needed. (Patient not taking: Reported on 08/31/2023)   [DISCONTINUED] traMADol (ULTRAM) 50 MG tablet Take 1 tablet (50 mg total) by mouth every 6 (six) hours as needed. (Patient not taking: Reported on 08/12/2023)   No facility-administered encounter medications on file as of 10/27/2023.   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   Lower back pain 03/01/2015   Hyperlipidemia 09/26/2014   Right cervical radiculopathy 05/23/2012   Encounter for well adult exam with abnormal findings 05/20/2011   Snoring 05/15/2010   Insomnia 09/11/2008   ANEMIA-IRON DEFICIENCY 10/30/2007   Anxiety state 10/30/2007   Depression 10/30/2007   Migraine without aura 10/30/2007   Allergic rhinitis 10/30/2007   GERD 10/30/2007   IRRITABLE BOWEL SYNDROME, HX OF 10/30/2007   Social History   Socioeconomic History   Marital status: Married    Spouse name: Not on file   Number of children: 4   Years of education: Not on file   Highest education level: GED or equivalent  Occupational History   Not on file  Tobacco Use    Smoking status: Never   Smokeless tobacco: Never  Substance and Sexual Activity   Alcohol use: Yes   Drug use: No   Sexual activity: Not on file  Other Topics Concern   Not on file  Social History Narrative   Not on file   Social Determinants of Health   Financial Resource Strain: High Risk (08/19/2023)   Overall Financial Resource Strain (CARDIA)    Difficulty of Paying Living Expenses: Hard  Food Insecurity: No Food Insecurity (08/19/2023)   Hunger Vital Sign    Worried About Running Out of Food in the Last Year: Never true    Ran Out of Food in the Last Year: Never true  Transportation Needs: No Transportation Needs (08/19/2023)   PRAPARE - Administrator, Civil Service (Medical): No    Lack of Transportation (Non-Medical): No  Physical  Activity: Unknown (08/19/2023)   Exercise Vital Sign    Days of Exercise per Week: 0 days    Minutes of Exercise per Session: Not on file  Stress: Stress Concern Present (08/19/2023)   [employer] of Occupational Health - Occupational Stress Questionnaire    Feeling of Stress : To some extent  Social Connections: Moderately Isolated (08/19/2023)   Social Connection and Isolation Panel [NHANES]    Frequency of Communication with Friends and Family: More than three times a week    Frequency of Social Gatherings with Friends and Family: More than three times a week    Attends Religious Services: 1 to 4 times per year    Active Member of [employer] or Organizations: No    Attends Banker Meetings: Not on file    Marital Status: Separated  Intimate Partner Violence: Not on file    Ms. Mcjunkins'Denise family history includes Coronary artery disease in an other family member; Depression in an other family member; Heart disease in her father.      Objective:    Vitals:   10/27/23 0940  BP: 110/70  Pulse: 85    Physical Exam Well-developed well-nourished older white female in no acute distress.  Height, Weight 147, BMI  23.02  HEENT; nontraumatic normocephalic, EOMI, PE R LA, sclera anicteric. Oropharynx; R small aphthous ulcers present along the right tongue Neck; supple, no JVD Cardiovascular; regular rate and rhythm with S1-S2, no murmur rub or gallop Pulmonary; Clear bilaterally Abdomen; soft, nontender, nondistended, no palpable mass or hepatosplenomegaly, bowel sounds are active Rectal; not done today Skin; benign exam, no jaundice rash or appreciable lesions Extremities; no clubbing cyanosis or edema skin warm and dry Neuro/Psych; alert and oriented x4, grossly nonfocal mood and affect appropriate        Assessment & Plan:   #64 67 year old white female due for screening colonoscopy. Last colonoscopy 2012 with 1 diminutive hyperplastic polyp and noted internal hemorrhoids. Currently no lower GI symptoms, average risk  #2 history of GERD, diagnosed 2003.  She had not required any medication for many many years and had always been able to control symptoms with diet, stopping smoking etc.  Now with 3 current symptoms over the past 3 to 4 months. She has had improvement in heartburn and indigestion with addition of famotidine 20 mg p.o. twice daily but also has developed dysphagia primarily to solid food and that has persisted.  She has had episodes requiring regurgitation, and describes a choking sensation with these episodes. 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  She has been evaluated by ENT and plan is for biopsy of 1 of these lesions if they persist despite Valtrex.  #4 IBS #5.  Osteoporosis #6.  History of migraines #7.  Anxiety  Plan; For now we will continue famotidine 20 mg p.o. twice daily AC- Patient will be scheduled for EGD with possible esophageal dilation with Dr. Leone Payor, and will also be  scheduled for colonoscopy at the same setting.  Both procedures were discussed in detail with the patient including indications risk benefits and she is agreeable to proceed. Advised to continue Valtrex once daily for now, and keep follow-up with ENT. She may need to start a PPI but will wait for results of EGD prior to changing meds. Continue antireflux regimen.     Denise Park  Denise Lounell Schumacher PA-C 10/27/2023   Cc: Corwin Levins, MD

## 2023-10-27 NOTE — Patient Instructions (Signed)
You have been scheduled for an endoscopy and colonoscopy. Please follow the written instructions given to you at your visit today.  Please pick up your prep supplies at the pharmacy within the next 1-3 days.  If you use inhalers (even only as needed), please bring them with you on the day of your procedure.  DO NOT TAKE 7 DAYS PRIOR TO TEST- Trulicity (dulaglutide) Ozempic, Wegovy (semaglutide) Mounjaro (tirzepatide) Bydureon Bcise (exanatide extended release)  DO NOT TAKE 1 DAY PRIOR TO YOUR TEST Rybelsus (semaglutide) Adlyxin (lixisenatide) Victoza (liraglutide) Byetta (exanatide) ___________________________________________________________________________   Continue Famotidine 20 mg twice daily before meals  Gastroesophageal Reflux Disease, Adult Gastroesophageal reflux (GER) happens when acid from the stomach flows up into the tube that connects the mouth and the stomach (esophagus). Normally, food travels down the esophagus and stays in the stomach to be digested. However, when a person has GER, food and stomach acid sometimes move back up into the esophagus. If this becomes a more serious problem, the person may be diagnosed with a disease called gastroesophageal reflux disease (GERD). GERD occurs when the reflux: Happens often. Causes frequent or severe symptoms. Causes problems such as damage to the esophagus. When stomach acid comes in contact with the esophagus, the acid may cause inflammation in the esophagus. Over time, GERD may create small holes (ulcers) in the lining of the esophagus. What are the causes? This condition is caused by a problem with the muscle between the esophagus and the stomach (lower esophageal sphincter, or LES). Normally, the LES muscle closes after food passes through the esophagus to the stomach. When the LES is weakened or abnormal, it does not close properly, and that allows food and stomach acid to go back up into the esophagus. The LES can be  weakened by certain dietary substances, medicines, and medical conditions, including: Tobacco use. Pregnancy. Having a hiatal hernia. Alcohol use. Certain foods and beverages, such as coffee, chocolate, onions, and peppermint. What increases the risk? You are more likely to develop this condition if you: Have an increased body weight. Have a connective tissue disorder. Take NSAIDs, such as ibuprofen. What are the signs or symptoms? Symptoms of this condition include: Heartburn. Difficult or painful swallowing and the feeling of having a lump in the throat. A bitter taste in the mouth. Bad breath and having a large amount of saliva. Having an upset or bloated stomach and belching. Chest pain. Different conditions can cause chest pain. Make sure you see your health care provider if you experience chest pain. Shortness of breath or wheezing. Ongoing (chronic) cough or a nighttime cough. Wearing away of tooth enamel. Weight loss. How is this diagnosed? This condition may be diagnosed based on a medical history and a physical exam. To determine if you have mild or severe GERD, your health care provider may also monitor how you respond to treatment. You may also have tests, including: A test to examine your stomach and esophagus with a small camera (endoscopy). A test that measures the acidity level in your esophagus. A test that measures how much pressure is on your esophagus. A barium swallow or modified barium swallow test to show the shape, size, and functioning of your esophagus. How is this treated? Treatment for this condition may vary depending on how severe your symptoms are. Your health care provider may recommend: Changes to your diet. Medicine. Surgery. The goal of treatment is to help relieve your symptoms and to prevent complications. Follow these instructions at home: Eating and  drinking  Follow a diet as recommended by your health care provider. This may involve  avoiding foods and drinks such as: Coffee and tea, with or without caffeine. Drinks that contain alcohol. Energy drinks and sports drinks. Carbonated drinks or sodas. Chocolate and cocoa. Peppermint and mint flavorings. Garlic and onions. Horseradish. Spicy and acidic foods, including peppers, chili powder, curry powder, vinegar, hot sauces, and barbecue sauce. Citrus fruit juices and citrus fruits, such as oranges, lemons, and limes. Tomato-based foods, such as red sauce, chili, salsa, and pizza with red sauce. Fried and fatty foods, such as donuts, french fries, potato chips, and high-fat dressings. High-fat meats, such as hot dogs and fatty cuts of red and white meats, such as rib eye steak, sausage, ham, and bacon. High-fat dairy items, such as whole milk, butter, and cream cheese. Eat small, frequent meals instead of large meals. Avoid drinking large amounts of liquid with your meals. Avoid eating meals during the 2-3 hours before bedtime. Avoid lying down right after you eat. Do not exercise right after you eat. Lifestyle  Do not use any products that contain nicotine or tobacco. These products include cigarettes, chewing tobacco, and vaping devices, such as e-cigarettes. If you need help quitting, ask your health care provider. Try to reduce your stress by using methods such as yoga or meditation. If you need help reducing stress, ask your health care provider. If you are overweight, reduce your weight to an amount that is healthy for you. Ask your health care provider for guidance about a safe weight loss goal. General instructions Pay attention to any changes in your symptoms. Take over-the-counter and prescription medicines only as told by your health care provider. Do not take aspirin, ibuprofen, or other NSAIDs unless your health care provider told you to take these medicines. Wear loose-fitting clothing. Do not wear anything tight around your waist that causes pressure on  your abdomen. Raise (elevate) the head of your bed about 6 inches (15 cm). You can use a wedge to do this. Avoid bending over if this makes your symptoms worse. Keep all follow-up visits. This is important. Contact a health care provider if: You have: New symptoms. Unexplained weight loss. Difficulty swallowing or it hurts to swallow. Wheezing or a persistent cough. A hoarse voice. Your symptoms do not improve with treatment. Get help right away if: You have sudden pain in your arms, neck, jaw, teeth, or back. You suddenly feel sweaty, dizzy, or light-headed. You have chest pain or shortness of breath. You vomit and the vomit is green, yellow, or black, or it looks like blood or coffee grounds. You faint. You have stool that is red, bloody, or black. You cannot swallow, drink, or eat. These symptoms may represent a serious problem that is an emergency. Do not wait to see if the symptoms will go away. Get medical help right away. Call your local emergency services (911 in the [place] Do not drive yourself to the hospital. Summary Gastroesophageal reflux happens when acid from the stomach flows up into the esophagus. GERD is a disease in which the reflux happens often, causes frequent or severe symptoms, or causes problems such as damage to the esophagus. Treatment for this condition may vary depending on how severe your symptoms are. Your health care provider may recommend diet and lifestyle changes, medicine, or surgery. Contact a health care provider if you have new or worsening symptoms. Take over-the-counter and prescription medicines only as told by your health care provider. Do not  take aspirin, ibuprofen, or other NSAIDs unless your health care provider told you to do so. Keep all follow-up visits as told by your health care provider. This is important. This information is not intended to replace advice given to you by your health care provider. Make sure you discuss any questions you  have with your health care provider. Document Revised: 06/15/2020 Document Reviewed: 06/17/2020 Elsevier Patient Education  2024 [employer].  I appreciate the  opportunity to care for you  Thank You   Amy [employer]

## 2023-10-28 ENCOUNTER — Other Ambulatory Visit: Payer: Self-pay | Admitting: Internal Medicine

## 2023-10-28 ENCOUNTER — Other Ambulatory Visit: Payer: Self-pay

## 2023-11-02 ENCOUNTER — Other Ambulatory Visit (INDEPENDENT_AMBULATORY_CARE_PROVIDER_SITE_OTHER): Payer: Self-pay | Admitting: Otolaryngology

## 2023-11-04 ENCOUNTER — Encounter: Payer: Self-pay | Admitting: Internal Medicine

## 2023-11-16 ENCOUNTER — Encounter: Payer: Self-pay | Admitting: Internal Medicine

## 2023-11-16 ENCOUNTER — Ambulatory Visit: Payer: 59 | Admitting: Internal Medicine

## 2023-11-16 VITALS — BP 118/74 | HR 57 | Temp 98.2°F | Ht 67.0 in | Wt 150.0 lb

## 2023-11-16 DIAGNOSIS — B009 Herpesviral infection, unspecified: Secondary | ICD-10-CM | POA: Insufficient documentation

## 2023-11-16 DIAGNOSIS — E559 Vitamin D deficiency, unspecified: Secondary | ICD-10-CM | POA: Diagnosis not present

## 2023-11-16 DIAGNOSIS — F411 Generalized anxiety disorder: Secondary | ICD-10-CM | POA: Diagnosis not present

## 2023-11-16 DIAGNOSIS — K219 Gastro-esophageal reflux disease without esophagitis: Secondary | ICD-10-CM

## 2023-11-16 DIAGNOSIS — R739 Hyperglycemia, unspecified: Secondary | ICD-10-CM | POA: Diagnosis not present

## 2023-11-16 DIAGNOSIS — K148 Other diseases of tongue: Secondary | ICD-10-CM | POA: Diagnosis not present

## 2023-11-16 DIAGNOSIS — E7849 Other hyperlipidemia: Secondary | ICD-10-CM

## 2023-11-16 DIAGNOSIS — E538 Deficiency of other specified B group vitamins: Secondary | ICD-10-CM | POA: Diagnosis not present

## 2023-11-16 LAB — CBC WITH DIFFERENTIAL/PLATELET
Basophils Absolute: 0 10*3/uL (ref 0.0–0.1)
Basophils Relative: 0.6 % (ref 0.0–3.0)
Eosinophils Absolute: 0.2 10*3/uL (ref 0.0–0.7)
Eosinophils Relative: 3 % (ref 0.0–5.0)
HCT: 43.9 % (ref 36.0–46.0)
Hemoglobin: 14.4 g/dL (ref 12.0–15.0)
Lymphocytes Relative: 27.5 % (ref 12.0–46.0)
Lymphs Abs: 1.7 10*3/uL (ref 0.7–4.0)
MCHC: 32.8 g/dL (ref 30.0–36.0)
MCV: 92.5 fL (ref 78.0–100.0)
Monocytes Absolute: 0.6 10*3/uL (ref 0.1–1.0)
Monocytes Relative: 9.6 % (ref 3.0–12.0)
Neutro Abs: 3.7 10*3/uL (ref 1.4–7.7)
Neutrophils Relative %: 59.3 % (ref 43.0–77.0)
Platelets: 284 10*3/uL (ref 150.0–400.0)
RBC: 4.75 Mil/uL (ref 3.87–5.11)
RDW: 15.2 % (ref 11.5–15.5)
WBC: 6.2 10*3/uL (ref 4.0–10.5)

## 2023-11-16 LAB — LIPID PANEL
Cholesterol: 176 mg/dL (ref 0–200)
HDL: 69.7 mg/dL (ref >39.00)
LDL Cholesterol: 95 mg/dL (ref 0–99)
NonHDL: 106.52
Total CHOL/HDL Ratio: 3
Triglycerides: 57 mg/dL (ref 0.0–149.0)
VLDL: 11.4 mg/dL (ref 0.0–40.0)

## 2023-11-16 LAB — URINALYSIS, ROUTINE W REFLEX MICROSCOPIC
Bilirubin Urine: NEGATIVE
Hgb urine dipstick: NEGATIVE
Ketones, ur: NEGATIVE
Leukocytes,Ua: NEGATIVE
Nitrite: NEGATIVE
RBC / HPF: NONE SEEN (ref 0–?)
Specific Gravity, Urine: <=1.005 — AB (ref 1.000–1.030)
Total Protein, Urine: NEGATIVE
Urine Glucose: NEGATIVE
Urobilinogen, UA: 0.2 (ref 0.0–1.0)
WBC, UA: NONE SEEN (ref 0–?)
pH: 6 (ref 5.0–8.0)

## 2023-11-16 LAB — BASIC METABOLIC PANEL
BUN: 19 mg/dL (ref 6–23)
CO2: 30 meq/L (ref 19–32)
Calcium: 9.9 mg/dL (ref 8.4–10.5)
Chloride: 105 meq/L (ref 96–112)
Creatinine, Ser: 0.88 mg/dL (ref 0.40–1.20)
GFR: 69.02 mL/min (ref >60.00)
Glucose, Bld: 93 mg/dL (ref 70–99)
Potassium: 4.2 meq/L (ref 3.5–5.1)
Sodium: 141 meq/L (ref 135–145)

## 2023-11-16 LAB — HEPATIC FUNCTION PANEL
ALT: 13 U/L (ref 0–35)
AST: 20 U/L (ref 0–37)
Albumin: 4.5 g/dL (ref 3.5–5.2)
Alkaline Phosphatase: 82 U/L (ref 39–117)
Bilirubin, Direct: 0.1 mg/dL (ref 0.0–0.3)
Total Bilirubin: 0.6 mg/dL (ref 0.2–1.2)
Total Protein: 7.4 g/dL (ref 6.0–8.3)

## 2023-11-16 LAB — VITAMIN D 25 HYDROXY (VIT D DEFICIENCY, FRACTURES): VITD: 35.3 ng/mL (ref 30.00–100.00)

## 2023-11-16 LAB — VITAMIN B12: Vitamin B-12: 832 pg/mL (ref 211–911)

## 2023-11-16 LAB — HEMOGLOBIN A1C: Hgb A1c MFr Bld: 5.6 % (ref 4.6–6.5)

## 2023-11-16 LAB — TSH: TSH: 1.87 u[IU]/mL (ref 0.35–5.50)

## 2023-11-16 MED ORDER — VALACYCLOVIR HCL 500 MG PO TABS: 500.0000 mg | ORAL_TABLET | Freq: Every day | ORAL | 3 refills | Status: DC

## 2023-11-16 NOTE — Progress Notes (Signed)
The test results show that your current treatment is OK, as the tests are stable.  Please continue the same plan.  There is no other need for change of treatment or further evaluation based on these results, at this time.  thanks 

## 2023-11-16 NOTE — Progress Notes (Signed)
Patient ID: Denise Park, female   DOB: 30-Dec-1955, 67 y.o.   MRN: [id number]        Chief Complaint: follow up type I and II herpes, right tongue lesion, gerd, hld, low vit d and b12, hyperglycemia       HPI:  Denise Park is a 67 y.o. female here with c/o recent upper resp illness apparently complicated by evidence for type I herpes outbreak over several weeks finally determined after several providers per Dr Lawerance Bach at last visit.   Initially had ST a few months ago, seen at [facility], tx for strep but no effective, seen several times by other providers, but HSV infection finally diagnosed - type 1 and 2 by serology and exam.   Also seen per ENT with residual right tongue lesion and exam non malignant benign per pt. Except if not resolveing was advised to return for biopsy which she plans to do since it the lesion persists.   Denies worsening abd pain, dysphagia, n/v, bowel change or blood Also with recent worsening reflux, has GI eval soon, symptoms now controlled with bid PPI  Denies worsening depressive symptoms, suicidal ideation, or panic; has ongoing anxiety,.   Wt Readings from Last 3 Encounters:  11/16/23 150 lb (68 kg)  10/27/23 147 lb (66.7 kg)  10/19/23 153 lb (69.4 kg)   BP Readings from Last 3 Encounters:  11/16/23 118/74  10/27/23 110/70  08/31/23 112/64         Past Medical History:  Diagnosis Date   ALLERGIC RHINITIS 10/30/2007   ANEMIA-IRON DEFICIENCY 10/30/2007   ANXIETY 10/30/2007   Arthritis    BACK PAIN 02/13/2008   Cataract    COMMON MIGRAINE 10/30/2007   DEPRESSION 10/30/2007   GERD 10/30/2007   HYPERSOMNIA 09/11/2008   INSOMNIA-SLEEP DISORDER-UNSPEC 09/11/2008   IRRITABLE BOWEL SYNDROME, HX OF 10/30/2007   OSTEOPENIA 02/13/2008   Osteopenia 05/22/2011   Osteoporosis    PELVIC PAIN, CHRONIC 05/14/2009   SINUSITIS- ACUTE-NOS 02/13/2008   Past Surgical History:  Procedure Laterality Date   APPENDECTOMY     age 67   CESAREAN SECTION     TUBAL LIGATION       reports that she has never smoked. She has never used smokeless tobacco. She reports current alcohol use. She reports that she does not use drugs. family history includes Coronary artery disease in an other family member; Depression in an other family member; Heart disease in her father. No Known Allergies Current Outpatient Medications on File Prior to Visit  Medication Sig Dispense Refill   ALPRAZolam (XANAX) 0.25 MG tablet Take 1 tablet (0.25 mg total) by mouth 2 (two) times daily as needed for anxiety. 60 tablet 2   buPROPion ER (WELLBUTRIN SR) 100 MG 12 hr tablet TAKE 1 TABLET BY MOUTH TWICE A DAY 180 tablet 2   busPIRone (BUSPAR) 10 MG tablet TAKE 1 TABLET BY MOUTH THREE TIMES A DAY AS NEEDED 270 tablet 1   CALCIUM-VITAMIN D PO Take by mouth daily.     cetirizine (ZYRTEC) 10 MG tablet TAKE 1 TABLET(10 MG) BY MOUTH DAILY 90 tablet 3   diclofenac Sodium (VOLTAREN) 1 % GEL Apply 2 g topically 4 (four) times daily. 100 g 5   famotidine (PEPCID) 20 MG tablet TAKE 1 TABLET BY MOUTH TWICE A DAY 180 tablet 1   IMVEXXY MAINTENANCE PACK 4 MCG INST Place 1 capsule vaginally 2 (two) times a week.     meloxicam (MOBIC) 15 MG tablet TAKE 1 TABLET BY  MOUTH EVERY DAY AS NEEDED FOR PAIN 90 tablet 2   tretinoin (RETIN-A) 0.05 % cream APPLY TO AFFECTED AREA EVERY DAY AT BEDTIME     triamcinolone (NASACORT) 55 MCG/ACT AERO nasal inhaler Place 2 sprays into the nose daily. 1 each 12   triamcinolone cream (KENALOG) 0.5 % Apply topically 2 (two) times daily. Apply to areas under the tongue twice daily for 2 weeks 15 g 0   zolpidem (AMBIEN) 10 MG tablet TAKE 1 TABLET BY MOUTH EVERY DAY AT BEDTIME AS NEEDED 90 tablet 1   No current facility-administered medications on file prior to visit.        ROS:  All others reviewed and negative.  Objective        PE:  BP 118/74 (BP Location: Right Arm, Patient Position: Sitting, Cuff Size: Normal)   Pulse (!) 57   Temp 98.2 F (36.8 C) (Oral)   Ht 5\' 7"  (1.702 m)    Wt 150 lb (68 kg)   LMP 03/31/2011   SpO2 100%   BMI 23.49 kg/m                 Constitutional: Pt appears in NAD               HENT: Head: NCAT.                Right Ear: External ear normal.                 Left Ear: External ear normal.                Eyes: . Pupils are equal, round, and reactive to light. Conjunctivae and EOM are normal;  right mid lateral tongue with white patch shallow ulceration, clean without heaped edges or tender               Nose: without d/c or deformity               Neck: Neck supple. Gross normal ROM               Cardiovascular: Normal rate and regular rhythm.                 Pulmonary/Chest: Effort normal and breath sounds without rales or wheezing.                Abd:  Soft, NT, ND, + BS, no organomegaly               Neurological: Pt is alert. At baseline orientation, motor grossly intact               Skin: Skin is warm. No rashes, no other new lesions, LE edema - none               Psychiatric: Pt behavior is normal without agitation , marked nervous  Micro: none  Cardiac tracings I have personally interpreted today:  none  Pertinent Radiological findings (summarize): none   Lab Results  Component Value Date   WBC 6.2 11/16/2023   HGB 14.4 11/16/2023   HCT 43.9 11/16/2023   PLT 284.0 11/16/2023   GLUCOSE 93 11/16/2023   CHOL 176 11/16/2023   TRIG 57.0 11/16/2023   HDL 69.70 11/16/2023   LDLCALC 95 11/16/2023   ALT 13 11/16/2023   AST 20 11/16/2023   NA 141 11/16/2023   K 4.2 11/16/2023   CL 105 11/16/2023   CREATININE 0.88 11/16/2023   BUN 19 11/16/2023  CO2 30 11/16/2023   TSH 1.87 11/16/2023   HGBA1C 5.6 11/16/2023   Assessment/Plan:  Denise Park is a 67 y.o. White or Caucasian [1] female with  has a past medical history of ALLERGIC RHINITIS (10/30/2007), ANEMIA-IRON DEFICIENCY (10/30/2007), ANXIETY (10/30/2007), Arthritis, BACK PAIN (02/13/2008), Cataract, COMMON MIGRAINE (10/30/2007), DEPRESSION (10/30/2007), GERD  (10/30/2007), HYPERSOMNIA (09/11/2008), INSOMNIA-SLEEP DISORDER-UNSPEC (09/11/2008), IRRITABLE BOWEL SYNDROME, HX OF (10/30/2007), OSTEOPENIA (02/13/2008), Osteopenia (05/22/2011), Osteoporosis, PELVIC PAIN, CHRONIC (05/14/2009), and SINUSITIS- ACUTE-NOS (02/13/2008).  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 500 every day suppressive tx  Herpes simplex type 1 infection Clinically resolved  GERD Mild symptomatic, for cond PPI, f/u EGD and colonoscopy soon  Anxiety state Chronic persistent with recent worsening situational stressors - for cond xanax prn, declines SSRI or other change in tx  Vitamin D deficiency Last vitamin D Lab Results  Component Value Date   VD25OH 35.30 11/16/2023   Low, to start oral replacement   Hyperglycemia Lab Results  Component Value Date   HGBA1C 5.6 11/16/2023   Stable, pt to continue current medical treatment  - diet , wt control   B12 deficiency Lab Results  Component Value Date   VITAMINB12 832 11/16/2023   Stable, cont oral replacement - b12 1000 mcg qd   Followup: Return in about 6 months (around 05/15/2024).  Oliver Barre, MD 11/20/2023 10:04 AM [facility]

## 2023-11-16 NOTE — Patient Instructions (Addendum)
Ok to finish the valtrex you have now (1000 twice per day)  After that, you can start valtrex 500 mg per day if you like to prevent further outbreaks  Please be sure to follow up with ENT regarding the right sided tongue spot and pain  Please continue all other medications as before, and refills have been done if requested.  Please have the pharmacy call with any other refills you may need.  Please continue your efforts at being more active, low cholesterol diet, and weight control.  You are otherwise up to date with prevention measures today.  Please keep your appointments with your specialists as you may have planned  Please go to the LAB at the blood drawing area for the tests to be done  You will be contacted by phone if any changes need to be made immediately.  Otherwise, you will receive a letter about your results with an explanation, but please check with MyChart first.  Please make an Appointment to return in 6 months, or sooner if needed

## 2023-11-20 ENCOUNTER — Encounter: Payer: Self-pay | Admitting: Internal Medicine

## 2023-11-20 DIAGNOSIS — E538 Deficiency of other specified B group vitamins: Secondary | ICD-10-CM | POA: Insufficient documentation

## 2023-11-20 DIAGNOSIS — K148 Other diseases of tongue: Secondary | ICD-10-CM | POA: Insufficient documentation

## 2023-11-20 DIAGNOSIS — R739 Hyperglycemia, unspecified: Secondary | ICD-10-CM | POA: Insufficient documentation

## 2023-11-20 DIAGNOSIS — E559 Vitamin D deficiency, unspecified: Secondary | ICD-10-CM | POA: Insufficient documentation

## 2023-11-20 NOTE — Assessment & Plan Note (Signed)
Last vitamin D Lab Results  Component Value Date   VD25OH 35.30 11/16/2023   Low, to start oral replacement

## 2023-11-20 NOTE — Assessment & Plan Note (Signed)
Lab Results  Component Value Date   HGBA1C 5.6 11/16/2023   Stable, pt to continue current medical treatment  - diet , wt control

## 2023-11-20 NOTE — Assessment & Plan Note (Signed)
Mild symptomatic, for cond PPI, f/u EGD and colonoscopy soon

## 2023-11-20 NOTE — Assessment & Plan Note (Signed)
Lab Results  Component Value Date   VITAMINB12 832 11/16/2023   Stable, cont oral replacement - b12 1000 mcg qd

## 2023-11-20 NOTE — Assessment & Plan Note (Signed)
Chronic persistent with recent worsening situational stressors - for cond xanax prn, declines SSRI or other change in tx

## 2023-11-20 NOTE — Assessment & Plan Note (Signed)
Ok for daily valtrex 500 every day suppressive tx

## 2023-11-20 NOTE — Assessment & Plan Note (Signed)
Lab Results  Component Value Date   LDLCALC 95 11/16/2023   Stable, pt to continue diet, wt control

## 2023-11-20 NOTE — Assessment & Plan Note (Signed)
Clinically resolved.  

## 2023-11-20 NOTE — Assessment & Plan Note (Signed)
Has persistent right lateral mid tongue lesion, appear likely benign but encouraged pt to f/u with ENT as suggested at her last visit recently

## 2023-11-22 ENCOUNTER — Ambulatory Visit (INDEPENDENT_AMBULATORY_CARE_PROVIDER_SITE_OTHER): Payer: 59

## 2023-11-24 ENCOUNTER — Encounter: Payer: Self-pay | Admitting: Internal Medicine

## 2023-11-24 ENCOUNTER — Ambulatory Visit: Payer: 59 | Admitting: Internal Medicine

## 2023-11-24 VITALS — BP 124/32 | HR 65 | Temp 98.0°F | Resp 15 | Ht 67.0 in | Wt 158.0 lb

## 2023-11-24 DIAGNOSIS — Z1211 Encounter for screening for malignant neoplasm of colon: Secondary | ICD-10-CM

## 2023-11-24 DIAGNOSIS — K222 Esophageal obstruction: Secondary | ICD-10-CM

## 2023-11-24 DIAGNOSIS — K259 Gastric ulcer, unspecified as acute or chronic, without hemorrhage or perforation: Secondary | ICD-10-CM | POA: Diagnosis not present

## 2023-11-24 DIAGNOSIS — K2210 Ulcer of esophagus without bleeding: Secondary | ICD-10-CM | POA: Diagnosis not present

## 2023-11-24 DIAGNOSIS — K449 Diaphragmatic hernia without obstruction or gangrene: Secondary | ICD-10-CM | POA: Diagnosis not present

## 2023-11-24 DIAGNOSIS — K5730 Diverticulosis of large intestine without perforation or abscess without bleeding: Secondary | ICD-10-CM | POA: Diagnosis not present

## 2023-11-24 DIAGNOSIS — K219 Gastro-esophageal reflux disease without esophagitis: Secondary | ICD-10-CM

## 2023-11-24 DIAGNOSIS — R1319 Other dysphagia: Secondary | ICD-10-CM

## 2023-11-24 MED ORDER — OMEPRAZOLE 20 MG PO CPDR: 20.0000 mg | DELAYED_RELEASE_CAPSULE | Freq: Every day | ORAL | 3 refills | Status: DC

## 2023-11-24 MED ORDER — SODIUM CHLORIDE 0.9 % IV SOLN: 500.0000 mL | INTRAVENOUS | Status: DC

## 2023-11-24 NOTE — Progress Notes (Unsigned)
Zofran given IV by Cathlyn Parsons, CRNA at adm to Recovery.

## 2023-11-24 NOTE — Progress Notes (Unsigned)
History and Physical Interval Note:  11/24/2023 3:35 PM  Denise Park  has presented today for endoscopic procedure(s), with the diagnosis of  Encounter Diagnoses  Name Primary?   Special screening for malignant neoplasms, colon Yes   Gastroesophageal reflux disease, unspecified whether esophagitis present    Other dysphagia   .  The various methods of evaluation and treatment have been discussed with the patient and/or family. After consideration of risks, benefits and other options for treatment, the patient has consented to  the endoscopic procedure(s).   The patient's history has been reviewed, patient examined, no change in status, stable for endoscopic procedure(s).  I have reviewed the patient's chart and labs.  Questions were answered to the patient's satisfaction.     Iva Boop, MD, [facility]

## 2023-11-24 NOTE — Patient Instructions (Addendum)
The esophagus had signs of reflux where the esophagus and stomach meet. I took biopsies and will contact you with results and additional recommendations.  I dilated there but no effect seen - the esophagus seemed tight or narrow at the upper aspect so I dilated there and opened it up. You should swallow better.  No polyps or cancer in the colon.  You do have diverticulosis - thickened muscle rings and pouches in the colon wall. Please read the handout about this condition.  Will stop famotidine and start omeprazole instaed.  I appreciate the opportunity to care for you. Iva Boop, MD, [facility]   Resume all of your  previous medications today as ordered.  Follow the eating instructions for today.  Use salt water gargles.  Use chloraseptic spray if the sore throat gets bad.  If it doesn't work, call us. Read all of the handouts and discharge instructions given to you by your recovery room nurse.  YOU HAD AN ENDOSCOPIC PROCEDURE TODAY AT THE  ENDOSCOPY CENTER:   Refer to the procedure report that was given to you for any specific questions about what was found during the examination.  If the procedure report does not answer your questions, please call your gastroenterologist to clarify.  If you requested that your care partner not be given the details of your procedure findings, then the procedure report has been included in a sealed envelope for you to review at your convenience later.  YOU SHOULD EXPECT: Some feelings of bloating in the abdomen. Passage of more gas than usual.  Walking can help get rid of the air that was put into your GI tract during the procedure and reduce the bloating. If you had a lower endoscopy (such as a colonoscopy or flexible sigmoidoscopy) you may notice spotting of blood in your stool or on the toilet paper. If you underwent a bowel prep for your procedure, you may not have a normal bowel movement for a few days.  Please Note:  You might notice some  irritation and congestion in your nose or some drainage.  This is from the oxygen used during your procedure.  There is no need for concern and it should clear up in a day or so.  SYMPTOMS TO REPORT IMMEDIATELY:  Following lower endoscopy (colonoscopy or flexible sigmoidoscopy):  Excessive amounts of blood in the stool  Significant tenderness or worsening of abdominal pains  Swelling of the abdomen that is new, acute  Fever of 100F or higher  Following upper endoscopy (EGD)  Vomiting of blood or coffee ground material  New chest pain or pain under the shoulder blades  Painful or persistently difficult swallowing  New shortness of breath  Fever of 100F or higher  Black, tarry-looking stools  For urgent or emergent issues, a gastroenterologist can be reached at any hour by calling [phone]. Do not use MyChart messaging for urgent concerns.    DIET:  We do recommend clear liquids until 5:30 pm. You may have a soft diet for the rest of today.  You may proceed to your regular diet tomorrow. Drink plenty of fluids but you should avoid alcohol.  ACTIVITY:  You should plan to take it easy for the rest of today and you should NOT DRIVE or use heavy machinery until tomorrow (because of the sedation medicines used during the test).    FOLLOW UP: Our staff will call the number listed on your records the next business day following your procedure.  We will  call around 7:15- 8:00 am to check on you and address any questions or concerns that you may have regarding the information given to you following your procedure. If we do not reach you, we will leave a message.     If any biopsies were taken you will be contacted by phone or by letter within the next 1-3 weeks.  Please call us at [phone] if you have not heard about the biopsies in 3 weeks.    SIGNATURES/CONFIDENTIALITY: You and/or your care partner have signed paperwork which will be entered into your electronic medical record.   These signatures attest to the fact that that the information above on your After Visit Summary has been reviewed and is understood.  Full responsibility of the confidentiality of this discharge information lies with you and/or your care-partner.

## 2023-11-24 NOTE — Op Note (Signed)
[facility] Patient Name: Denise Park Procedure Date: 11/24/2023 3:29 PM MRN: [id number] Endoscopist: Iva Boop , MD, [id number] Age: 67 Referring MD:  Date of Birth: Oct 06, 1956 Gender: Female Account #: [id number] Procedure:                Colonoscopy Indications:              Screening for colorectal malignant neoplasm, Last                            colonoscopy: 2014 Medicines:                Monitored Anesthesia Care Procedure:                Pre-Anesthesia Assessment:                           - Prior to the procedure, a History and Physical                            was performed, and patient medications and                            allergies were reviewed. The patient's tolerance of                            previous anesthesia was also reviewed. The risks                            and benefits of the procedure and the sedation                            options and risks were discussed with the patient.                            All questions were answered, and informed consent                            was obtained. Prior Anticoagulants: The patient has                            taken no anticoagulant or antiplatelet agents. ASA                            Grade Assessment: II - A patient with mild systemic                            disease. After reviewing the risks and benefits,                            the patient was deemed in satisfactory condition to                            undergo the procedure.  After obtaining informed consent, the colonoscope                            was passed under direct vision. Throughout the                            procedure, the patient's blood pressure, pulse, and                            oxygen saturations were monitored continuously. The                            CF [id number] #[id number] was introduced through the anus                            and advanced to the the cecum,  identified by                            appendiceal orifice and ileocecal valve. The                            colonoscopy was somewhat difficult due to                            significant looping. Successful completion of the                            procedure was aided by applying abdominal pressure.                            The patient tolerated the procedure well. The                            quality of the bowel preparation was good. The                            ileocecal valve, appendiceal orifice, and rectum                            were photographed. Scope In: 4:01:23 PM Scope Out: 4:17:23 PM Scope Withdrawal Time: 0 hours 8 minutes 48 seconds  Total Procedure Duration: 0 hours 16 minutes 0 seconds  Findings:                 The perianal and digital rectal examinations were                            normal.                           Multiple diverticula were found in the sigmoid                            colon.  The exam was otherwise without abnormality on                            direct and retroflexion views. Complications:            No immediate complications. Estimated Blood Loss:     Estimated blood loss: none. Impression:               - Diverticulosis in the sigmoid colon.                           - The examination was otherwise normal on direct                            and retroflexion views.                           - No specimens collected. Recommendation:           - Patient has a contact number available for                            emergencies. The signs and symptoms of potential                            delayed complications were discussed with the                            patient. Return to normal activities tomorrow.                            Written discharge instructions were provided to the                            patient.                           - Clear liquids x 1 hour then soft foods rest of                             day. Start prior diet tomorrow.                           - See the other procedure note for documentation of                            additional recommendations.                           - Repeat colonoscopy in 10 years for screening                            purposes. Iva Boop, MD 11/24/2023 4:41:26 PM This report has been signed electronically.

## 2023-11-24 NOTE — Op Note (Addendum)
[facility] Patient Name: Denise Park Procedure Date: 11/24/2023 3:23 PM MRN: [id number] Endoscopist: Iva Boop , MD, [id number] Age: 67 Referring MD:  Date of Birth: 06/23/1956 Gender: Female Account #: [id number] Procedure:                Upper GI endoscopy Indications:              Dysphagia Medicines:                Monitored Anesthesia Care Procedure:                Pre-Anesthesia Assessment:                           - Prior to the procedure, a History and Physical                            was performed, and patient medications and                            allergies were reviewed. The patient's tolerance of                            previous anesthesia was also reviewed. The risks                            and benefits of the procedure and the sedation                            options and risks were discussed with the patient.                            All questions were answered, and informed consent                            was obtained. Prior Anticoagulants: The patient has                            taken no anticoagulant or antiplatelet agents. ASA                            Grade Assessment: II - A patient with mild systemic                            disease. After reviewing the risks and benefits,                            the patient was deemed in satisfactory condition to                            undergo the procedure.                           After obtaining informed consent, the endoscope was  passed under direct vision. Throughout the                            procedure, the patient's blood pressure, pulse, and                            oxygen saturations were monitored continuously. The                            GIF [id number] #[id number] was introduced through the                            mouth, and advanced to the second part of duodenum.                            The upper GI endoscopy was accomplished  without                            difficulty. The patient tolerated the procedure                            well. Scope In: Scope Out: Findings:                 LA Grade A (one or more mucosal breaks less than 5                            mm, not extending between tops of 2 mucosal folds)                            esophagitis with no bleeding was found at the                            gastroesophageal junction. Biopsies were taken with                            a cold forceps for histology. Verification of                            patient identification for the specimen was done.                            Estimated blood loss was minimal.                           One mild stenosis was found at the gastroesophageal                            junction. The stenosis was traversed. A TTS dilator                            was passed through the scope. Dilation with an  18-19-20 mm balloon dilator was performed to 20 mm.                            The dilation site was examined and showed no                            change. Estimated blood loss: none. Given no                            dilation effect I performed retrograde dilation                            puul through of esophagus at 18 mm - the dilated                            would not pass proximal esophagus UES area so it                            was deflated and re-positioned in that area -                            dilatin as below.                           One benign-appearing, intrinsic moderate                            (circumferential scarring or stenosis; an endoscope                            may pass) stenosis was found at the                            cricopharyngeus. The stenosis was traversed. A TTS                            dilator was passed through the scope. Dilation with                            an 18-19-20 mm balloon dilator was performed to 18                             mm. The dilation site was examined and showed                            moderate mucosal disruption. Estimated blood loss                            was minimal.                           A small sliding hiatal hernia was found.  The exam was otherwise without abnormality.                           The cardia and gastric fundus were normal on                            retroflexion. Complications:            No immediate complications. Estimated Blood Loss:     Estimated blood loss was minimal. Impression:               - LA Grade A reflux esophagitis with no bleeding.                            Biopsied.                           - Esophageal stenosis. Dilated. 20 mm - no effect                           - Benign-appearing esophageal stenosis - proximal                            esophagus. Dilated.18 mm w/ effect                           - Small sliding hiatal hernia.                           - The examination was otherwise normal. Recommendation:           - Patient has a contact number available for                            emergencies. The signs and symptoms of potential                            delayed complications were discussed with the                            patient. Return to normal activities tomorrow.                            Written discharge instructions were provided to the                            patient.                           - Clear liquids x 1 hour then soft foods rest of                            day. Start prior diet tomorrow.                           - Continue present medications. Currently 20 mg bid  famotidine - await pathology - start omeprazole 20                            mg qd and stop famotidine                           - Await pathology results.                           - See the other procedure note for documentation of                            additional  recommendations. Iva Boop, MD 11/24/2023 4:39:27 PM This report has been signed electronically.

## 2023-11-24 NOTE — Progress Notes (Signed)
Pt states no changes to health hx since previsit

## 2023-11-24 NOTE — Progress Notes (Unsigned)
Sedate, gd SR, tolerated procedure well, VSS, report to RN 

## 2023-11-24 NOTE — Progress Notes (Signed)
Called to room to assist during endoscopic procedure.  Patient ID and intended procedure confirmed with present staff. Received instructions for my participation in the procedure from the performing physician.  

## 2023-11-25 ENCOUNTER — Telehealth: Payer: Self-pay | Admitting: *Deleted

## 2023-11-25 NOTE — Telephone Encounter (Signed)
  Follow up Call-     11/24/2023    2:54 PM  Call back number  Post procedure Call Back phone  # [phone]  Permission to leave phone message Yes     Patient questions:  Do you have a fever, pain , or abdominal swelling? No. Pain Score  0 *  Have you tolerated food without any problems? Yes.    Have you been able to return to your normal activities? Yes.    Do you have any questions about your discharge instructions: Diet   No. Medications  No. Follow up visit  No.  Do you have questions or concerns about your Care? No.  Actions: * If pain score is 4 or above: No action needed, pain <4.

## 2023-11-29 LAB — SURGICAL PATHOLOGY

## 2023-12-02 ENCOUNTER — Telehealth: Payer: Self-pay

## 2023-12-02 ENCOUNTER — Encounter: Payer: Self-pay | Admitting: Internal Medicine

## 2023-12-02 NOTE — Telephone Encounter (Signed)
Returned patients call. She is concerned about the pain after having the BX, especially being at Christmas time. Per Dr. Allena Katz, she should be fine to eat afterwards and would most likely do a punch BX.  Or, we can reschedule her appointment after Christmas.  Patient would like to think about it and will call back tomorrow.

## 2023-12-03 ENCOUNTER — Ambulatory Visit (INDEPENDENT_AMBULATORY_CARE_PROVIDER_SITE_OTHER): Payer: 59

## 2023-12-09 ENCOUNTER — Ambulatory Visit (INDEPENDENT_AMBULATORY_CARE_PROVIDER_SITE_OTHER): Payer: 59

## 2023-12-28 ENCOUNTER — Other Ambulatory Visit (HOSPITAL_COMMUNITY)
Admission: RE | Admit: 2023-12-28 | Discharge: 2023-12-28 | Disposition: A | Discharge status: Home / Self Care | Payer: 59 | Source: Ambulatory Visit | Attending: Otolaryngology | Admitting: Otolaryngology

## 2023-12-28 ENCOUNTER — Encounter (INDEPENDENT_AMBULATORY_CARE_PROVIDER_SITE_OTHER): Payer: Self-pay

## 2023-12-28 ENCOUNTER — Ambulatory Visit (INDEPENDENT_AMBULATORY_CARE_PROVIDER_SITE_OTHER): Payer: 59 | Admitting: Otolaryngology

## 2023-12-28 VITALS — BP 125/81 | HR 76 | Resp 19

## 2023-12-28 DIAGNOSIS — K146 Glossodynia: Secondary | ICD-10-CM | POA: Diagnosis not present

## 2023-12-28 DIAGNOSIS — J029 Acute pharyngitis, unspecified: Secondary | ICD-10-CM

## 2023-12-28 DIAGNOSIS — K148 Other diseases of tongue: Secondary | ICD-10-CM | POA: Insufficient documentation

## 2023-12-28 NOTE — Progress Notes (Signed)
 Dear Dr. Norleen, Here is my assessment for our mutual patient, Denise Park. Thank you for allowing me the opportunity to care for your patient. Please do not hesitate to contact me should you have any other questions. Sincerely, Dr. Eldora Blanch  Otolaryngology Clinic Note Referring provider: Dr. Norleen HPI:  Denise Park is a 68 y.o. female kindly referred by Dr. Norleen for evaluation of mouth sores  Initial visit (09/2023): Has had mouth tenderness for a while - spicy foods trigger pain/discomfort. Recent acid reflux with significant symptoms, then started to have sore throat and some white patches on her tongue (painful). Went to urgent care, treated for strep and candidiasis (negative for strep though) with amoxicillin  (empiric for strep throat), and diflucan.   Also was tested for HSV and at Last visit was positive for HSV 1 and 2 - prescribed magic mouthwash and valacyclovir , which she reports helped significantly. Took prednisone , and valacyclovir  (x2) - finished about 6 weeks ago. Went away and then potentially came back so got another course of antivirals and things are much better overall. Still feels some discomfort on undersurface of tongue  No other ulcers anywhere else; no history of oral ulcers. Does not smoke, no tobacco use, no autoimmune disease history. No joint pain, rash, fevers. No inhalers.  Denies ear pain, odynophagia, hemoptysis. Weight loss. Dysphagia with solids - intermittent and some sore throat as well (discomfort, not sharp pain) Has been having problems with acid reflux and was not taking medications.  Has a scheduled endo in GI next week. Does have some globus and dysphagia to solids. No overt aspiration episodes. No voice changes. --------------------------------------------------------- We opted for close observation and follow up. She unfortunately had to reschedule but follows up today 12/28/2023 Oral lesion still persists; no change, not painful. Sore throat  has improved. Of note, she reports she still has continued to have burning sensation in floor of mouth; no other masses; no other changes. Trying to do good oral hygiene.  Burning sensation she reports today has been ongoing for 2-3 years now, way before the other lesions this year.  She did try reflux medication for GERD and it has not helped. She is on valtrex  for suppressive tx  H&N Surgery: denies Personal or FHx of bleeding dz or anesthesia difficulty: no  Independent Review of Additional Tests or Records:  Tested for HIV/COVID/CXR - negative Independent review of notes and test interpretation including HSV - unclear if actual cause but HSV 1 and 2 IgG + since it's only IgG Dr. 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 Avram):   Path 11/24/2023:   CBC, TSH, B12, D25, BMP 11/16/2023 reviewed: wnl PMH/Meds/All/SocHx/FamHx/ROS:   Past Medical History:  Diagnosis Date   ALLERGIC RHINITIS 10/30/2007   ANEMIA-IRON DEFICIENCY 10/30/2007   ANXIETY 10/30/2007   Arthritis    BACK PAIN 02/13/2008   Cataract    COMMON MIGRAINE 10/30/2007   DEPRESSION 10/30/2007   GERD 10/30/2007   HYPERSOMNIA 09/11/2008   INSOMNIA-SLEEP DISORDER-UNSPEC 09/11/2008   IRRITABLE BOWEL SYNDROME, HX OF 10/30/2007   OSTEOPENIA 02/13/2008   Osteopenia 05/22/2011   Osteoporosis    PELVIC PAIN, CHRONIC 05/14/2009   SINUSITIS- ACUTE-NOS 02/13/2008     Past Surgical History:  Procedure Laterality Date   APPENDECTOMY     age 84   CESAREAN SECTION     COLONOSCOPY     ESOPHAGOGASTRODUODENOSCOPY     TUBAL  LIGATION      Family History  Problem Relation Age of Onset   Heart disease Father    Coronary artery disease Other        Female 1st degree relative, female 1st degree relative <60 sister   Depression Other    Colon polyps Neg Hx    Colon cancer Neg Hx    Esophageal cancer Neg Hx    Rectal cancer Neg Hx     Stomach cancer Neg Hx      Social Connections: Moderately Isolated (08/19/2023)   Social Connection and Isolation Panel [NHANES]    Frequency of Communication with Friends and Family: More than three times a week    Frequency of Social Gatherings with Friends and Family: More than three times a week    Attends Religious Services: 1 to 4 times per year    Active Member of [employer] or Organizations: No    Attends Engineer, Structural: Not on file    Marital Status: Separated    Tobacco: denies   Current Outpatient Medications:    ALPRAZolam  (XANAX ) 0.25 MG tablet, Take 1 tablet (0.25 mg total) by mouth 2 (two) times daily as needed for anxiety., Disp: 60 tablet, Rfl: 2   buPROPion  ER (WELLBUTRIN  SR) 100 MG 12 hr tablet, TAKE 1 TABLET BY MOUTH TWICE A DAY, Disp: 180 tablet, Rfl: 2   busPIRone  (BUSPAR ) 10 MG tablet, TAKE 1 TABLET BY MOUTH THREE TIMES A DAY AS NEEDED, Disp: 270 tablet, Rfl: 1   CALCIUM-VITAMIN D  PO, Take by mouth daily., Disp: , Rfl:    cetirizine  (ZYRTEC ) 10 MG tablet, TAKE 1 TABLET(10 MG) BY MOUTH DAILY, Disp: 90 tablet, Rfl: 3   diclofenac  Sodium (VOLTAREN ) 1 % GEL, Apply 2 g topically 4 (four) times daily., Disp: 100 g, Rfl: 5   IMVEXXY MAINTENANCE PACK 4 MCG INST, Place 1 capsule vaginally 2 (two) times a week., Disp: , Rfl:    meloxicam  (MOBIC ) 15 MG tablet, TAKE 1 TABLET BY MOUTH EVERY DAY AS NEEDED FOR PAIN, Disp: 90 tablet, Rfl: 2   omeprazole  (PRILOSEC) 20 MG capsule, Take 1 capsule (20 mg total) by mouth daily before breakfast., Disp: 90 capsule, Rfl: 3   tretinoin (RETIN-A) 0.05 % cream, APPLY TO AFFECTED AREA EVERY DAY AT BEDTIME, Disp: , Rfl:    triamcinolone  (NASACORT ) 55 MCG/ACT AERO nasal inhaler, Place 2 sprays into the nose daily., Disp: 1 each, Rfl: 12   triamcinolone  cream (KENALOG ) 0.5 %, Apply topically 2 (two) times daily. Apply to areas under the tongue twice daily for 2 weeks, Disp: 15 g, Rfl: 0   valACYclovir  (VALTREX ) 500 MG tablet, Take 1  tablet (500 mg total) by mouth daily., Disp: 90 tablet, Rfl: 3   zolpidem  (AMBIEN ) 10 MG tablet, TAKE 1 TABLET BY MOUTH EVERY DAY AT BEDTIME AS NEEDED, Disp: 90 tablet, Rfl: 1   Physical Exam:   BP 125/81 (BP Location: Right Arm, Patient Position: Sitting, Cuff Size: Normal)   Pulse 76   Resp 19   LMP 03/31/2011   SpO2 96%    Salient findings:  CN II-XII intact  Bilateral EAC clear and TM intact with well pneumatized middle ear spaces Anterior rhinoscopy: Septum relatively midline; bilateral inferior turbinates without significant hypertrophy No lesions of oropharynx; dentition good There are two small white patches which are non-tender on bilateral lateral surface of tongue. Does not scrape off; not painful. These are stable in size but persistent. Given persistence, we discussed R/B/A for  biopsy and she decided to pursue biopsy. See below No obviously palpable neck masses/lymphadenopathy/thyromegaly No respiratory distress or stridor    Procedures:   Procedure Note - Punch Biopsy Name: Francetta Ilg MRN: [id number] Date: 01/02/24   Pre-procedure diagnosis: bilateral tongue Lesions, persistent, concern for malignancy Post-procedure diagnosis: Same Procedure: Punch biopsy of anterior 1/3rd tongue lesions Tongue: CPT 41100 Complications: None apparent EBL: 0 mL Indication: Bilateral persistent tongue Lesion, concern for malignancy  Risks and benefits of biopsy were explained to the patient, and consent was obtained.   The patient was identified as the correct patient.  The areas surrounding the lesion was injected 1% Lidocaine  with 1:100,000 epinephrine and allowed to work.. A 3 mm punch biopsy was used to make a circular incision in the periphery of both tongue lesions. The tissue was carefully picked up with a forcep and cut free with sharp scissors. They were placed in formalin and passed off the field. There was minimal bleeding that fully resolved prior to the end of the  procedure with the following interventions: silver nitrate cautery. The patient tolerated the procedure well.    Electronically signed by: Eldora KATHEE Blanch, MD 01/02/2024 1:06 PM   Electronically signed by: Eldora KATHEE Blanch, MD 12/28/2023 2:05 PM   Impression & Plans:  Tynia Wiers is a 68 y.o. female with: Recurrent oral ulcers - painful; there appears to be a small area on lateral tongue bilaterally which does not scrape off; does not look like candida. She has had prior workup and treatment for this with PO antibiotics and antivirals. Antivirals seemed to have helped but I am not sure if HSV is the cause of this. She does not have ulcers anywhere else and Autoimmune causes and no Rfx for immunocompromised status. Query lichen planus but does not seem to be the case. Can be leukoplakia or dysplastic lesion but malignancy seems unlikely currently - We treated her for this prior with steroid cream and it did not help. Of note, the lesions have noted changed. Also consider derm deferral for this - Given persistence, they were biopsied - Tylenol and ibuprofen  as needed for pain 2. Sore throat, dysphagia - TFL reassuring prior, improved 3. Oral cavity burning mouth/pain -- she reports that she has had some FOM burning for over 2-3 years now. Intermittent. She has had workup for this including labs, and I wonder given reassuring exam if she has burning mouth syndrome. Could also have post-herpetic neuralgia which is exacerbating this. We did discuss this. Will monitor currently  Will follow up pathology results and contact her with those. If benign, could likely just watch; if not, would recommend excision.   Thank you for allowing me the opportunity to care for your patient. Please do not hesitate to contact me should you have any other questions.  Sincerely, Eldora Blanch, MD Otolarynoglogist (ENT), [facility] Specialist Phone: [phone] Fax: [phone]  12/28/2023, 2:05 PM   I have  personally spent 33 minutes involved in face-to-face and non-face-to-face activities for this patient on the day of the visit.  Professional time spent excludes any procedures performed but includes the following activities, in addition to those noted in the documentation: preparing to see the patient (review of outside documentation and results), performing a medically appropriate examination, counseling and discussion, documenting in the electronic health record and interpreting results

## 2023-12-29 ENCOUNTER — Telehealth (INDEPENDENT_AMBULATORY_CARE_PROVIDER_SITE_OTHER): Payer: Self-pay | Admitting: Otolaryngology

## 2023-12-29 NOTE — Telephone Encounter (Signed)
 Patient called and stated that the area where you wiped her tongue during the biopsy is sore and uncomfortable following the visit on 12/28/2023. She stated she just wanted to make you aware of it.

## 2023-12-30 LAB — SURGICAL PATHOLOGY

## 2024-01-02 ENCOUNTER — Encounter (INDEPENDENT_AMBULATORY_CARE_PROVIDER_SITE_OTHER): Payer: Self-pay | Admitting: Otolaryngology

## 2024-01-10 DIAGNOSIS — N393 Stress incontinence (female) (male): Secondary | ICD-10-CM | POA: Insufficient documentation

## 2024-02-07 ENCOUNTER — Telehealth: Payer: Self-pay | Admitting: Internal Medicine

## 2024-02-07 ENCOUNTER — Telehealth: Payer: Self-pay

## 2024-02-07 NOTE — Telephone Encounter (Signed)
Copied from CRM [id number]. Topic: Clinical - Medication Question >> Feb 07, 2024 12:39 PM Almira Coaster wrote: Reason for CRM: Patient is scheduled to see Dr.John on 02/29/2024, she is requesting to have a Prolia injection that same day if possible.  --- I didn't see any info about a prolia approval, please start a PA for the pt if appropriate.

## 2024-02-07 NOTE — Telephone Encounter (Signed)
 Message sent to PA team.

## 2024-02-08 ENCOUNTER — Telehealth: Payer: Self-pay

## 2024-02-08 NOTE — Telephone Encounter (Signed)
Prolia VOB initiated via [email or web address]  Next Prolia inj DUE:  02/2024

## 2024-02-17 ENCOUNTER — Other Ambulatory Visit (HOSPITAL_COMMUNITY): Payer: Self-pay

## 2024-02-17 NOTE — Telephone Encounter (Signed)
 Resubmitted info into [employer] portal with Medicare and [employer] as secondary.

## 2024-02-22 ENCOUNTER — Other Ambulatory Visit: Payer: Self-pay | Admitting: Internal Medicine

## 2024-02-23 ENCOUNTER — Other Ambulatory Visit: Payer: Self-pay

## 2024-02-25 NOTE — Telephone Encounter (Signed)
 Marland Kitchen

## 2024-02-29 ENCOUNTER — Ambulatory Visit: Payer: 59 | Admitting: Internal Medicine

## 2024-02-29 NOTE — Telephone Encounter (Signed)
 Pharmacy Patient Advocate Encounter   Received notification from  [employer] Portal that prior authorization for Prolia is required/requested.   Insurance verification completed.   The patient is insured through [employer] .   Per test claim: PA required; PA submitted to above mentioned insurance via [employer]/confirmation #/EOC [id number] Status is pending

## 2024-03-01 ENCOUNTER — Other Ambulatory Visit: Payer: Self-pay

## 2024-03-01 DIAGNOSIS — M810 Age-related osteoporosis without current pathological fracture: Secondary | ICD-10-CM

## 2024-03-01 MED ORDER — DENOSUMAB 60 MG/ML ~~LOC~~ SOSY
60.0000 mg | PREFILLED_SYRINGE | Freq: Once | SUBCUTANEOUS | Status: AC
  Administered 2024-07-14: 60 mg via SUBCUTANEOUS

## 2024-03-06 ENCOUNTER — Other Ambulatory Visit (HOSPITAL_COMMUNITY): Payer: Self-pay

## 2024-03-06 NOTE — Telephone Encounter (Signed)
 Pt ready for scheduling for Prolia on or after : 03/06/24  Option# 1: Buy/Bill (Office supplied medication)  Out-of-pocket cost due at time of clinic visit: $695 (15%+ $350 deductible)  Number of injection/visits approved: --  Primary: [employer] - Commercial Prolia co-insurance: 15% Admin fee co-insurance: 15%  Secondary: N/A Prolia co-insurance:  Admin fee co-insurance:   Medical Benefit Details: Date Benefits were checked: 02/25/24 Deductible: $350 ($0 met)/ Coinsurance: 15%/ Admin Fee: 15%  Prior Auth: Approved PA# [id number] Expiration Date: 02/29/24 to 02/27/25  # of doses approved: ----------------------------------------------------------------------- Option# 2- Med Obtained from pharmacy:  Pharmacy benefit: Copay $-- (Paid to pharmacy) Admin Fee: -- (Pay at clinic)  Prior Auth: required for pharmacy PA#  Expiration Date:   # of doses approved:   If patient wants fill through the pharmacy benefit please send prescription to:  [facility] , and include estimated need by date in rx notes. Pharmacy will ship medication directly to the office.  Patient may eligible for Prolia Copay Card. Copay Card can make patient's cost as little as $25. Link to apply: https://www.amgensupportplus.com/copay  ** This summary of benefits is an estimation of the patient's out-of-pocket cost. Exact cost may very based on individual plan coverage.

## 2024-03-17 ENCOUNTER — Ambulatory Visit: Payer: Self-pay

## 2024-03-17 DIAGNOSIS — L659 Nonscarring hair loss, unspecified: Secondary | ICD-10-CM

## 2024-03-17 NOTE — Telephone Encounter (Signed)
 Copied from CRM [id number]. Topic: Clinical - Request for Lab/Test Order >> Mar 17, 2024 12:40 PM Denise Park wrote: Reason for CRM: Patient stated that her dermatologist advised that she gets a full panel blood test for thyroid. She wants to know if she has to fast.  Preferred date: April 21 Early morning

## 2024-03-17 NOTE — Telephone Encounter (Signed)
  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 Patient also states she was told by her dermatologist that she needed to get a thyroid panel done by her PCP. Patient states she had gone to dermatologist due to having an area of residing hair line. The dermatologist told patient to have her thyroid tested. Per protocol, patient is recommended to be seen in 2 weeks. Patient was asking if she could be scheduled April 21 as that is her day off. Patient states she can always ask off if she needed to be seen earlier. Unable to scheduled per protocol along with patient request. Patient will need a call back about scheduling for an appointment. Patient verbalized understanding of plan and all questions answered.    Copied from CRM [id number]. Topic: Clinical - Red Word Triage >> Mar 17, 2024 12:44 PM Denese Killings wrote: Red Word that prompted transfer to Nurse Triage: Patient has swelling of the feet and it is burning at the bottom of her feet. Reason for Disposition  [1] MILD pain (e.g., does not interfere with normal activities) AND [2] present > 7 days  Answer Assessment - Initial Assessment Questions 1. ONSET: "When did the pain start?"      Patient states this has been going on for some time 2. LOCATION: "Where is the pain located?"      Bottom of feet 3. PAIN: "How bad is the pain?"    (Scale 1-10; or mild, moderate, severe)  - MILD (1-3): doesn't interfere with  normal activities.   - MODERATE (4-7): interferes with normal activities (e.g., work or school) or awakens from sleep, limping.   - SEVERE (8-10): excruciating pain, unable to do any normal activities, unable to walk.      Mild 4. 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 CAUSE: "What do you think is causing the foot pain?"     Not sure 6. OTHER SYMPTOMS: "Do you have any other symptoms?" (e.g., leg pain, rash, fever, numbness)     Bottom of feet have burning, swelling to feet  Protocols used: Foot Pain-A-AH

## 2024-03-20 NOTE — Addendum Note (Signed)
 Addended by: Corwin Levins on: 03/20/2024 07:29 PM   Modules accepted: Orders

## 2024-03-20 NOTE — Telephone Encounter (Signed)
Ok lab is ordered 

## 2024-03-21 ENCOUNTER — Ambulatory Visit: Admitting: Internal Medicine

## 2024-03-21 ENCOUNTER — Other Ambulatory Visit

## 2024-03-21 ENCOUNTER — Encounter: Payer: Self-pay | Admitting: Internal Medicine

## 2024-03-21 VITALS — BP 122/68 | HR 75 | Temp 98.1°F | Ht 67.0 in | Wt 151.0 lb

## 2024-03-21 DIAGNOSIS — R232 Flushing: Secondary | ICD-10-CM | POA: Diagnosis not present

## 2024-03-21 DIAGNOSIS — L659 Nonscarring hair loss, unspecified: Secondary | ICD-10-CM

## 2024-03-21 DIAGNOSIS — E538 Deficiency of other specified B group vitamins: Secondary | ICD-10-CM

## 2024-03-21 DIAGNOSIS — R600 Localized edema: Secondary | ICD-10-CM

## 2024-03-21 DIAGNOSIS — R739 Hyperglycemia, unspecified: Secondary | ICD-10-CM | POA: Diagnosis not present

## 2024-03-21 DIAGNOSIS — E559 Vitamin D deficiency, unspecified: Secondary | ICD-10-CM | POA: Diagnosis not present

## 2024-03-21 NOTE — Patient Instructions (Addendum)
 Please wear compression stockings during daytime only   Please continue all other medications as before, and refills have been done if requested.  Please have the pharmacy call with any other refills you may need..  Please keep your appointments with your specialists as you may have planned  Please go to the LAB at the blood drawing area for the tests to be done  You will be contacted by phone if any changes need to be made immediately.  Otherwise, you will receive a letter about your results with an explanation, but please check with MyChart first.

## 2024-03-21 NOTE — Addendum Note (Signed)
 Addended by: Sumner Boast on: 03/21/2024 05:15 PM   Modules accepted: Orders

## 2024-03-21 NOTE — Progress Notes (Unsigned)
 Patient ID: Denise Park, female   DOB: 14-Nov-1956, 68 y.o.   MRN: [id number]        Chief Complaint: follow up hair loss, hot flashes, pedal edema, low vit d and b12, hyperglycemia       HPI:  Denise Park is a 68 y.o. female here with c/o several months of worsening hair loss and receding temple hair liens.  Also has increased hot flashes for unclear reason  Has mild pedal edema worsening but better in the AM, worse in the pm.  Pt denies chest pain, increased sob or doe, wheezing, orthopnea, PND, increased LE swelling, palpitations, dizziness or syncope.   Pt denies polydipsia, polyuria, or new focal neuro s/s.    Pt denies fever, wt loss, night sweats, loss of appetite, or other constitutional symptoms         Wt Readings from Last 3 Encounters:  03/21/24 151 lb (68.5 kg)  11/24/23 158 lb (71.7 kg)  11/16/23 150 lb (68 kg)   BP Readings from Last 3 Encounters:  03/21/24 122/68  12/28/23 125/81  11/24/23 (!) 124/32         Past Medical History:  Diagnosis Date   ALLERGIC RHINITIS 10/30/2007   ANEMIA-IRON DEFICIENCY 10/30/2007   ANXIETY 10/30/2007   Arthritis    BACK PAIN 02/13/2008   Cataract    COMMON MIGRAINE 10/30/2007   DEPRESSION 10/30/2007   GERD 10/30/2007   HYPERSOMNIA 09/11/2008   INSOMNIA-SLEEP DISORDER-UNSPEC 09/11/2008   IRRITABLE BOWEL SYNDROME, HX OF 10/30/2007   OSTEOPENIA 02/13/2008   Osteopenia 05/22/2011   Osteoporosis    PELVIC PAIN, CHRONIC 05/14/2009   SINUSITIS- ACUTE-NOS 02/13/2008   Past Surgical History:  Procedure Laterality Date   APPENDECTOMY     age 89   CESAREAN SECTION     COLONOSCOPY     ESOPHAGOGASTRODUODENOSCOPY     TUBAL LIGATION      reports that she has never smoked. She has never used smokeless tobacco. She reports current alcohol use. She reports that she does not use drugs. family history includes Coronary artery disease in an other family member; Depression in an other family member; Heart disease in her father. No Known  Allergies Current Outpatient Medications on File Prior to Visit  Medication Sig Dispense Refill   ALPRAZolam (XANAX) 0.25 MG tablet Take 1 tablet (0.25 mg total) by mouth 2 (two) times daily as needed for anxiety. 60 tablet 2   buPROPion ER (WELLBUTRIN SR) 100 MG 12 hr tablet TAKE 1 TABLET BY MOUTH TWICE A DAY 180 tablet 2   busPIRone (BUSPAR) 10 MG tablet TAKE 1 TABLET BY MOUTH THREE TIMES A DAY AS NEEDED 270 tablet 1   CALCIUM-VITAMIN D PO Take by mouth daily.     cetirizine (ZYRTEC) 10 MG tablet TAKE 1 TABLET(10 MG) BY MOUTH DAILY 90 tablet 3   diclofenac Sodium (VOLTAREN) 1 % GEL Apply 2 g topically 4 (four) times daily. 100 g 5   IMVEXXY MAINTENANCE PACK 4 MCG INST Place 1 capsule vaginally 2 (two) times a week.     meloxicam (MOBIC) 15 MG tablet TAKE 1 TABLET BY MOUTH EVERY DAY AS NEEDED FOR PAIN 90 tablet 2   omeprazole (PRILOSEC) 20 MG capsule Take 1 capsule (20 mg total) by mouth daily before breakfast. 90 capsule 3   tretinoin (RETIN-A) 0.05 % cream APPLY TO AFFECTED AREA EVERY DAY AT BEDTIME     triamcinolone (NASACORT) 55 MCG/ACT AERO nasal inhaler Place 2 sprays into the nose daily. 1  each 12   triamcinolone cream (KENALOG) 0.5 % Apply topically 2 (two) times daily. Apply to areas under the tongue twice daily for 2 weeks 15 g 0   valACYclovir (VALTREX) 500 MG tablet Take 1 tablet (500 mg total) by mouth daily. 90 tablet 3   zolpidem (AMBIEN) 10 MG tablet TAKE 1 TABLET BY MOUTH EVERY DAY AT BEDTIME AS NEEDED 90 tablet 1   Current Facility-Administered Medications on File Prior to Visit  Medication Dose Route Frequency Provider Last Rate Last Admin   denosumab (PROLIA) injection 60 mg  60 mg Subcutaneous Once Corwin Levins, MD            ROS:  All others reviewed and negative.  Objective        PE:  BP 122/68 (BP Location: Left Arm, Patient Position: Sitting, Cuff Size: Normal)   Pulse 75   Temp 98.1 F (36.7 C) (Oral)   Ht 5\' 7"  (1.702 m)   Wt 151 lb (68.5 kg)   LMP  03/31/2011   SpO2 99%   BMI 23.65 kg/m                 Constitutional: Pt appears in NAD               HENT: Head: NCAT.                Right Ear: External ear normal.                 Left Ear: External ear normal.                Eyes: . Pupils are equal, round, and reactive to light. Conjunctivae and EOM are normal               Nose: without d/c or deformity               Neck: Neck supple. Gross normal ROM               Cardiovascular: Normal rate and regular rhythm.                 Pulmonary/Chest: Effort normal and breath sounds without rales or wheezing.                Abd:  Soft, NT, ND, + BS, no organomegaly               Neurological: Pt is alert. At baseline orientation, motor grossly intact               Skin: Skin is warm. No rashes, no other new lesions, LE edema - trace bipedal; has receding temple hair lines               Psychiatric: Pt behavior is normal without agitation   Micro: none  Cardiac tracings I have personally interpreted today:  none  Pertinent Radiological findings (summarize): none   Lab Results  Component Value Date   WBC 6.2 11/16/2023   HGB 14.4 11/16/2023   HCT 43.9 11/16/2023   PLT 284.0 11/16/2023   GLUCOSE 93 11/16/2023   CHOL 176 11/16/2023   TRIG 57.0 11/16/2023   HDL 69.70 11/16/2023   LDLCALC 95 11/16/2023   ALT 13 11/16/2023   AST 20 11/16/2023   NA 141 11/16/2023   K 4.2 11/16/2023   CL 105 11/16/2023   CREATININE 0.88 11/16/2023   BUN 19 11/16/2023   CO2 30 11/16/2023   TSH 2.20  03/21/2024   HGBA1C 5.6 11/16/2023   Assessment/Plan:  Denise Park is a 68 y.o. White or Caucasian [1] female with  has a past medical history of ALLERGIC RHINITIS (10/30/2007), ANEMIA-IRON DEFICIENCY (10/30/2007), ANXIETY (10/30/2007), Arthritis, BACK PAIN (02/13/2008), Cataract, COMMON MIGRAINE (10/30/2007), DEPRESSION (10/30/2007), GERD (10/30/2007), HYPERSOMNIA (09/11/2008), INSOMNIA-SLEEP DISORDER-UNSPEC (09/11/2008), IRRITABLE BOWEL SYNDROME,  HX OF (10/30/2007), OSTEOPENIA (02/13/2008), Osteopenia (05/22/2011), Osteoporosis, PELVIC PAIN, CHRONIC (05/14/2009), and SINUSITIS- ACUTE-NOS (02/13/2008).  Vitamin D deficiency Last vitamin D Lab Results  Component Value Date   VD25OH 35.30 11/16/2023   Low, to start oral replacement   Hyperglycemia Lab Results  Component Value Date   HGBA1C 5.6 11/16/2023   Stable, pt to continue current medical treatment  - diet,wt control   B12 deficiency Lab Results  Component Value Date   VITAMINB12 832 11/16/2023   Stable, cont oral replacement - b12 1000 mcg qd   Hair loss Etiology unclear, for thyroid lab panel  Hot flashes Etiology unclear, declines veozah trial for now  Pedal edema Mild c/w venous insufficiency - for compression stockings daytime  Followup: Return in about 6 months (around 09/20/2024).  Oliver Barre, MD 03/24/2024 9:20 PM [facility]

## 2024-03-22 ENCOUNTER — Encounter: Payer: Self-pay | Admitting: Internal Medicine

## 2024-03-22 LAB — THYROID PANEL WITH TSH
Free Thyroxine Index: 2.1 (ref 1.4–3.8)
T3 Uptake: 30 % (ref 22–35)
T4, Total: 7.1 ug/dL (ref 5.1–11.9)
TSH: 2.2 m[IU]/L (ref 0.40–4.50)

## 2024-03-22 NOTE — Progress Notes (Signed)
 The test results show that your current treatment is OK, as the tests are stable.  Please continue the same plan.  There is no other need for change of treatment or further evaluation based on these results, at this time.  thanks

## 2024-03-23 NOTE — Telephone Encounter (Signed)
 Denise Park

## 2024-03-24 ENCOUNTER — Encounter: Payer: Self-pay | Admitting: Internal Medicine

## 2024-03-24 DIAGNOSIS — L659 Nonscarring hair loss, unspecified: Secondary | ICD-10-CM | POA: Insufficient documentation

## 2024-03-24 DIAGNOSIS — R600 Localized edema: Secondary | ICD-10-CM | POA: Insufficient documentation

## 2024-03-24 DIAGNOSIS — R232 Flushing: Secondary | ICD-10-CM | POA: Insufficient documentation

## 2024-03-24 NOTE — Assessment & Plan Note (Signed)
 Last vitamin D Lab Results  Component Value Date   VD25OH 35.30 11/16/2023   Low, to start oral replacement

## 2024-03-24 NOTE — Assessment & Plan Note (Signed)
 Mild c/w venous insufficiency - for compression stockings daytime

## 2024-03-24 NOTE — Assessment & Plan Note (Signed)
 Lab Results  Component Value Date   VITAMINB12 832 11/16/2023   Stable, cont oral replacement - b12 1000 mcg qd

## 2024-03-24 NOTE — Assessment & Plan Note (Signed)
 Lab Results  Component Value Date   HGBA1C 5.6 11/16/2023   Stable, pt to continue current medical treatment  - diet , wt control

## 2024-03-24 NOTE — Assessment & Plan Note (Signed)
 Etiology unclear, for thyroid lab panel

## 2024-03-24 NOTE — Assessment & Plan Note (Signed)
 Etiology unclear, declines veozah trial for now

## 2024-04-04 NOTE — Telephone Encounter (Signed)
 Kim to follow up ned for Prolia thanks

## 2024-04-05 NOTE — Telephone Encounter (Signed)
 Kim, I have not prescribed evenity in the past  Is it possible to start this for this patient?   thnaks

## 2024-04-10 ENCOUNTER — Ambulatory Visit (INDEPENDENT_AMBULATORY_CARE_PROVIDER_SITE_OTHER)

## 2024-04-10 ENCOUNTER — Encounter: Payer: Self-pay | Admitting: Podiatry

## 2024-04-10 ENCOUNTER — Ambulatory Visit: Admitting: Podiatry

## 2024-04-10 VITALS — Ht 67.0 in | Wt 151.0 lb

## 2024-04-10 DIAGNOSIS — M7752 Other enthesopathy of left foot: Secondary | ICD-10-CM | POA: Diagnosis not present

## 2024-04-10 DIAGNOSIS — M19071 Primary osteoarthritis, right ankle and foot: Secondary | ICD-10-CM | POA: Diagnosis not present

## 2024-04-10 DIAGNOSIS — M7751 Other enthesopathy of right foot: Secondary | ICD-10-CM

## 2024-04-10 DIAGNOSIS — E539 Vitamin B deficiency, unspecified: Secondary | ICD-10-CM | POA: Diagnosis not present

## 2024-04-10 DIAGNOSIS — M19072 Primary osteoarthritis, left ankle and foot: Secondary | ICD-10-CM | POA: Diagnosis not present

## 2024-04-10 MED ORDER — BETAMETHASONE SOD PHOS & ACET 6 (3-3) MG/ML IJ SUSP
3.0000 mg | Freq: Once | INTRAMUSCULAR | Status: AC
  Administered 2024-04-10: 3 mg via INTRA_ARTICULAR

## 2024-04-10 MED ORDER — GABAPENTIN 100 MG PO CAPS: 100.0000 mg | ORAL_CAPSULE | Freq: Every day | ORAL | 1 refills | Status: DC

## 2024-04-10 NOTE — Progress Notes (Signed)
   Chief Complaint  Patient presents with   Foot Pain    NP- burning bottom and slight swelling bilateral feet for about 6 months, pain on dorsal foot when wearing sneakers    HPI: 68 y.o. female presenting today as a new patient for evaluation of chronic burning sensation noted to the weightbearing surfaces of the bilateral feet.  Gradual chronic onset over 6 months.  She says it has slowly progressed and become worse.  She is also concerned because she was recently diagnosed with burning mouth syndrome.  She also has pain and tenderness along the dorsum of the feet bilateral which can be aggravated with certain shoes.  No history of injury.  Presenting for further treatment evaluation  Past Medical History:  Diagnosis Date   ALLERGIC RHINITIS 10/30/2007   ANEMIA-IRON DEFICIENCY 10/30/2007   ANXIETY 10/30/2007   Arthritis    BACK PAIN 02/13/2008   Cataract    COMMON MIGRAINE 10/30/2007   DEPRESSION 10/30/2007   GERD 10/30/2007   HYPERSOMNIA 09/11/2008   INSOMNIA-SLEEP DISORDER-UNSPEC 09/11/2008   IRRITABLE BOWEL SYNDROME, HX OF 10/30/2007   OSTEOPENIA 02/13/2008   Osteopenia 05/22/2011   Osteoporosis    PELVIC PAIN, CHRONIC 05/14/2009   SINUSITIS- ACUTE-NOS 02/13/2008    Past Surgical History:  Procedure Laterality Date   APPENDECTOMY     age 44   CESAREAN SECTION     COLONOSCOPY     ESOPHAGOGASTRODUODENOSCOPY     TUBAL LIGATION      No Known Allergies   Physical Exam: General: The patient is alert and oriented x3 in no acute distress.  Dermatology: Skin is warm, dry and supple bilateral lower extremities.   Vascular: Palpable pedal pulses bilaterally. Capillary refill within normal limits.  No appreciable edema.  No erythema.  Neurological: Grossly intact via light touch.  Chronic burning sensation noted along the plantar weightbearing surface of the feet bilateral  Musculoskeletal Exam: Prominence noted to the dorsum of the bilateral midtarsal joints consistent  with arthritic changes to the midtarsal joints.  Tenderness to palpation noted to the right foot  Radiographic Exam:  Normal osseous mineralization. Joint spaces preserved.  No fractures or osseous irregularities noted.  Assessment/Plan of Care: 1.  Chronic severe burning feet sensation bilateral 2.  Arthritis midtarsal joint bilateral  -Patient evaluated.  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 Dot Gazella, DPM    [address] [place], [place] [address]                Office [phone]  Fax [phone]

## 2024-05-10 ENCOUNTER — Other Ambulatory Visit (HOSPITAL_COMMUNITY): Payer: Self-pay

## 2024-05-10 ENCOUNTER — Telehealth: Payer: Self-pay

## 2024-05-10 NOTE — Telephone Encounter (Signed)
 Pharmacy Patient Advocate Encounter   Received notification from Patient Advice Request messages that prior authorization for PROLIA  is required/requested.   Insurance verification completed.   The patient is insured through [employer] .   Per test claim: PA required; PA submitted to above mentioned insurance via CoverMyMeds Key/confirmation #/EOC [id number] Status is pending

## 2024-05-11 ENCOUNTER — Other Ambulatory Visit (HOSPITAL_COMMUNITY): Payer: Self-pay

## 2024-05-11 NOTE — Telephone Encounter (Signed)
 Pharmacy Patient Advocate Encounter  Received notification from [employer] that Prior Authorization for PROLIA  has been APPROVED from 05/10/24 to 05/10/25   PA #/Case ID/Reference #: [id number]   Unable to run test claim. Prolia  must be filled at specialty pharmacy. Per [employer] report, The specialty pharmacy for this plan is [facility] at [phone] ; fax number is [phone].

## 2024-05-25 ENCOUNTER — Other Ambulatory Visit: Payer: Self-pay | Admitting: Internal Medicine

## 2024-06-06 NOTE — Telephone Encounter (Addendum)
 I cannot see what the cost will be at the specialty pharmacy. The office can call in a prescription and ask the specialty pharmacy to run a test claim to determine the cost.

## 2024-06-07 NOTE — Telephone Encounter (Signed)
 Copied from CRM [id number]. Topic: General - Other >> Jun 07, 2024 12:52 PM Emmet Harm C wrote: Reason for CRM: Patient wanted to come in for calcium shot on 6/24 and there wasn't any available and she wanted know if she can just come in without a appt  ---  LVM stating pt will need an appointment for her shot. Please assist this pt if you find out the cost for the specialty pharmacy.

## 2024-06-08 NOTE — Telephone Encounter (Signed)
 Called and spoke with patient as well as specialty pharmacy. They were not able to give me a price point until medication was ordered. Patient is not locked into paying copay amount until after she is aware of price point. Patient will contact us  once learning of copay amount

## 2024-06-14 ENCOUNTER — Ambulatory Visit: Admitting: Podiatry

## 2024-06-16 ENCOUNTER — Telehealth: Payer: Self-pay | Admitting: Internal Medicine

## 2024-06-16 NOTE — Telephone Encounter (Unsigned)
 Copied from CRM [id number]. Topic: Clinical - Medication Refill >> Jun 16, 2024 11:46 AM Chiquita SQUIBB wrote: Medication: denosumab  denosumab  (PROLIA ) injection 60 mg   Has the patient contacted their pharmacy? Yes- pharmacy calling in  (Agent: If no, request that the patient contact the pharmacy for the refill. If patient does not wish to contact the pharmacy document the reason why and proceed with request.) (Agent: If yes, when and what did the pharmacy advise?)  This is the patient's preferred pharmacy:    [facility] [place] [facility] [address] [place] [address] Phone: [phone] Fax: [phone]  Is this the correct pharmacy for this prescription? Yes If no, delete pharmacy and type the correct one.   Has the prescription been filled recently? No  Is the patient out of the medication? Yes  Has the patient been seen for an appointment in the last year OR does the patient have an upcoming appointment? Yes  Can we respond through MyChart? No  Agent: Please be advised that Rx refills may take up to 3 business days. We ask that you follow-up with your pharmacy.

## 2024-06-16 NOTE — Telephone Encounter (Signed)
 Pt requesting Prolia . Not able to attach       Medication: denosumab  denosumab  (PROLIA ) injection 60 mg     Has the patient contacted their pharmacy? Yes- pharmacy calling in  (Agent: If no, request that the patient contact the pharmacy for the refill. If patient does not wish to contact the pharmacy document the reason why and proceed with request.) (Agent: If yes, when and what did the pharmacy advise?)   This is the patient's preferred pharmacy:      [facility] [place] [facility] [address] [place] [address] Phone: [phone] Fax: [phone]

## 2024-06-20 ENCOUNTER — Telehealth: Payer: Self-pay | Admitting: Internal Medicine

## 2024-06-20 MED ORDER — DENOSUMAB 60 MG/ML ~~LOC~~ SOSY: 60.0000 mg | PREFILLED_SYRINGE | SUBCUTANEOUS | 11 refills | Status: DC

## 2024-06-20 NOTE — Telephone Encounter (Signed)
 Copied from CRM [id number]. Topic: Clinical - Medication Refill >> Jun 20, 2024 12:32 PM Chiquita SQUIBB wrote: Medication: denosumab  denosumab  (PROLIA ) injection 60 mg     Has the patient contacted their pharmacy? Yes- Pharmacy calling in  (Agent: If no, request that the patient contact the pharmacy for the refill. If patient does not wish to contact the pharmacy document the reason why and proceed with request.) (Agent: If yes, when and what did the pharmacy advise?)   This is the patient's preferred pharmacy:    The following medication is being requested for refill:        [facility] [place] [facility] [address] [place] [address] Phone: [phone] Fax: [phone]   Is this the correct pharmacy for this prescription? Yes If no, delete pharmacy and type the correct one.    Has the prescription been filled recently? No   Is the patient out of the medication? Yes   Has the patient been seen for an appointment in the last year OR does the patient have an upcoming appointment? Yes   Can we respond through MyChart? Yes   Agent: Please be advised that Rx refills may take up to 3 business days. We ask that you follow-up with your pharmacy.

## 2024-06-20 NOTE — Telephone Encounter (Unsigned)
 Copied from CRM [id number]. Topic: Clinical - Medication Refill >> Jun 20, 2024 12:32 PM Chiquita SQUIBB wrote: Medication: denosumab  denosumab  (PROLIA ) injection 60 mg   Has the patient contacted their pharmacy? Yes- Pharmacy calling in  (Agent: If no, request that the patient contact the pharmacy for the refill. If patient does not wish to contact the pharmacy document the reason why and proceed with request.) (Agent: If yes, when and what did the pharmacy advise?)  This is the patient's preferred pharmacy:    [facility] [place] [place] [address] [place] [address] Phone: [phone] Fax: [phone]  Is this the correct pharmacy for this prescription? Yes If no, delete pharmacy and type the correct one.   Has the prescription been filled recently? No  Is the patient out of the medication? Yes  Has the patient been seen for an appointment in the last year OR does the patient have an upcoming appointment? Yes  Can we respond through MyChart? Yes  Agent: Please be advised that Rx refills may take up to 3 business days. We ask that you follow-up with your pharmacy.

## 2024-06-20 NOTE — Telephone Encounter (Signed)
 Ok done erx      [facility] [facility] [place] [place] [facility] [address] [place] [address] Phone: [phone] Fax: [phone]

## 2024-06-20 NOTE — Telephone Encounter (Signed)
 Reason for CRM: April from [facility] called regarding PROLIA   prescriptiion.  She asked if the pt's insurance had approved the pre-auth..ibuprofen  confirmed it was approved. She also inquired when the prescription would be sent. I reached out to [facility], who stated that they were waiting for the insurance to provide the medication cost, as the pt does not want to pay the copay until she is aware of the price of the medication. Attempted to relay this back to April, but due to phone issues, I was unable to get clarification that she heard me correctly after placing call on hold. Tried placing an outgoing call to confirm information was received but was unsuccessful.

## 2024-06-21 ENCOUNTER — Telehealth: Payer: Self-pay | Admitting: Internal Medicine

## 2024-06-21 ENCOUNTER — Telehealth: Payer: Self-pay

## 2024-06-21 MED ORDER — OMEPRAZOLE 40 MG PO CPDR: 40.0000 mg | DELAYED_RELEASE_CAPSULE | Freq: Two times a day (BID) | ORAL | 1 refills | Status: DC

## 2024-06-21 NOTE — Telephone Encounter (Signed)
 Patient was started on omeprazole  20 mg daily by Greig Corti, PA in November 2024. Famotidine  was stopped. EGD and dilation done in December 2024. Omeprazole  20 mg continued. No problems since with swallowing. Reports she has continued to have burning in her esophagus when eating or drinking. I feel it burn. She felt omeprazole  was not helping. She stopped omeprazole  and resumed BID famotidine . She tells me this is not working either.   [facility] [address])

## 2024-06-21 NOTE — Telephone Encounter (Unsigned)
 Copied from CRM [id number]. Topic: Clinical - Medication Refill >> Jun 20, 2024 12:32 PM Chiquita SQUIBB wrote: Medication: denosumab  denosumab  (PROLIA ) injection 60 mg   Has the patient contacted their pharmacy? Yes- Pharmacy calling in  (Agent: If no, request that the patient contact the pharmacy for the refill. If patient does not wish to contact the pharmacy document the reason why and proceed with request.) (Agent: If yes, when and what did the pharmacy advise?)  This is the patient's preferred pharmacy:    [facility] [place] [place] - [address] [place] [address] Phone: [phone] Fax: [phone]  Is this the correct pharmacy for this prescription? Yes If no, delete pharmacy and type the correct one.   Has the prescription been filled recently? No  Is the patient out of the medication? Yes  Has the patient been seen for an appointment in the last year OR does the patient have an upcoming appointment? Yes  Can we respond through MyChart? Yes  Agent: Please be advised that Rx refills may take up to 3 business days. We ask that you follow-up with your pharmacy. >> Jun 21, 2024 10:17 AM Zebedee SAUNDERS wrote: Please send medication to:  [id number] [place] [place] - [address] AT [facility]  [address] [place] [address]  Phone: [phone] Fax: [phone]

## 2024-06-21 NOTE — Telephone Encounter (Signed)
 I reviewed her chart.  I see where she also has burning in the tongue area.  It could be she was not on enough of the omeprazole .  My recommendation is that she take 40 mg twice a day, a maximal dose to see if that works.  I also want her to schedule a next available follow-up.  We need to give 6 to 8 weeks of treatment on taking omeprazole  40 mg before breakfast and supper to see if that works.  You can prescribe that #180 with 1 refill.

## 2024-06-21 NOTE — Telephone Encounter (Signed)
 Spoke with the patient and explained the recommendations. Patient agrees to this plan of care.

## 2024-06-21 NOTE — Telephone Encounter (Signed)
 Inbound call from patient stating omeprazole  has not been working for her. States she still has pain and burning when eating. Patient is requesting a call back to discuss further. Please advise, thank you.

## 2024-06-21 NOTE — Telephone Encounter (Signed)
 Treasure Fine VD   06/21/24 10:06 AM Note Inbound call from patient stating omeprazole  has not been working for her. States she still has pain and burning when eating. Patient is requesting a call back to discuss further. Please advise, thank you.

## 2024-06-22 ENCOUNTER — Other Ambulatory Visit: Payer: Self-pay

## 2024-06-22 MED ORDER — DENOSUMAB 60 MG/ML ~~LOC~~ SOSY: 60.0000 mg | PREFILLED_SYRINGE | SUBCUTANEOUS | 11 refills | Status: AC

## 2024-06-22 NOTE — Telephone Encounter (Signed)
 Refill has been sent to pharmacy listed below.

## 2024-06-28 ENCOUNTER — Telehealth: Payer: Self-pay | Admitting: Internal Medicine

## 2024-06-28 NOTE — Telephone Encounter (Signed)
 Copied from CRM [id number]. Topic: Appointments - Scheduling Inquiry for Clinic >> Jun 28, 2024  9:27 AM Revonda D wrote: Reason for CRM: Pt stated that her prolia  shot is supposed to be at the office on Friday and she would like to come into the office to get the shot the same day it arrives.  ---  Please let me know when pt's medication has arrived so I can call her to schedule.  TDW

## 2024-06-29 NOTE — Telephone Encounter (Signed)
 Copied from CRM [id number]. Topic: Appointments - Scheduling Inquiry for Clinic >> Jun 29, 2024 11:52 AM Denise Park wrote: Patient is checking on her prolia  shot and she will be in [place] tomorrow and she stated she will call tomorrow to see if the shot is there   ---  Please assist

## 2024-06-30 ENCOUNTER — Other Ambulatory Visit: Payer: Self-pay

## 2024-06-30 ENCOUNTER — Emergency Department (HOSPITAL_BASED_OUTPATIENT_CLINIC_OR_DEPARTMENT_OTHER): Admitting: Radiology

## 2024-06-30 ENCOUNTER — Emergency Department (HOSPITAL_BASED_OUTPATIENT_CLINIC_OR_DEPARTMENT_OTHER)
Admission: EM | Admit: 2024-06-30 | Discharge: 2024-06-30 | Disposition: A | Discharge status: Home / Self Care | Source: Ambulatory Visit | Attending: Emergency Medicine | Admitting: Emergency Medicine

## 2024-06-30 ENCOUNTER — Encounter (HOSPITAL_COMMUNITY): Payer: Self-pay | Admitting: Emergency Medicine

## 2024-06-30 ENCOUNTER — Emergency Department (HOSPITAL_BASED_OUTPATIENT_CLINIC_OR_DEPARTMENT_OTHER)

## 2024-06-30 ENCOUNTER — Ambulatory Visit: Payer: Self-pay

## 2024-06-30 ENCOUNTER — Encounter (HOSPITAL_BASED_OUTPATIENT_CLINIC_OR_DEPARTMENT_OTHER): Payer: Self-pay | Admitting: Emergency Medicine

## 2024-06-30 ENCOUNTER — Ambulatory Visit (HOSPITAL_COMMUNITY)
Admission: EM | Admit: 2024-06-30 | Discharge: 2024-06-30 | Disposition: A | Discharge status: Home / Self Care | Attending: Physician Assistant | Admitting: Physician Assistant

## 2024-06-30 DIAGNOSIS — R079 Chest pain, unspecified: Secondary | ICD-10-CM

## 2024-06-30 DIAGNOSIS — K219 Gastro-esophageal reflux disease without esophagitis: Secondary | ICD-10-CM

## 2024-06-30 DIAGNOSIS — J40 Bronchitis, not specified as acute or chronic: Secondary | ICD-10-CM | POA: Insufficient documentation

## 2024-06-30 DIAGNOSIS — J219 Acute bronchiolitis, unspecified: Secondary | ICD-10-CM | POA: Diagnosis not present

## 2024-06-30 DIAGNOSIS — R0789 Other chest pain: Secondary | ICD-10-CM

## 2024-06-30 DIAGNOSIS — R110 Nausea: Secondary | ICD-10-CM

## 2024-06-30 LAB — CBC
HCT: 44.8 % (ref 36.0–46.0)
Hemoglobin: 14.9 g/dL (ref 12.0–15.0)
MCH: 30 pg (ref 26.0–34.0)
MCHC: 33.3 g/dL (ref 30.0–36.0)
MCV: 90.1 fL (ref 80.0–100.0)
Platelets: 243 K/uL (ref 150–400)
RBC: 4.97 MIL/uL (ref 3.87–5.11)
RDW: 12.2 % (ref 11.5–15.5)
WBC: 6.5 K/uL (ref 4.0–10.5)
nRBC: 0 % (ref 0.0–0.2)

## 2024-06-30 LAB — BASIC METABOLIC PANEL WITH GFR
Anion gap: 12 (ref 5–15)
BUN: 12 mg/dL (ref 8–23)
CO2: 22 mmol/L (ref 22–32)
Calcium: 10.4 mg/dL — ABNORMAL HIGH (ref 8.9–10.3)
Chloride: 106 mmol/L (ref 98–111)
Creatinine, Ser: 1.05 mg/dL — ABNORMAL HIGH (ref 0.44–1.00)
GFR, Estimated: 59 mL/min — ABNORMAL LOW (ref >60)
Glucose, Bld: 88 mg/dL (ref 70–99)
Potassium: 4.8 mmol/L (ref 3.5–5.1)
Sodium: 139 mmol/L (ref 135–145)

## 2024-06-30 LAB — TROPONIN T, HIGH SENSITIVITY
Troponin T High Sensitivity: <15 ng/L (ref <19)
Troponin T High Sensitivity: <15 ng/L (ref <19)

## 2024-06-30 MED ORDER — DOXYCYCLINE HYCLATE 100 MG PO CAPS: 100.0000 mg | ORAL_CAPSULE | Freq: Two times a day (BID) | ORAL | 0 refills | Status: DC

## 2024-06-30 MED ORDER — ESOMEPRAZOLE MAGNESIUM 40 MG PO CPDR: 40.0000 mg | DELAYED_RELEASE_CAPSULE | Freq: Every day | ORAL | 0 refills | Status: DC

## 2024-06-30 MED ORDER — IOHEXOL 350 MG/ML SOLN
75.0000 mL | Freq: Once | INTRAVENOUS | Status: AC | PRN
  Administered 2024-06-30: 75 mL via INTRAVENOUS

## 2024-06-30 MED ORDER — ALUM & MAG HYDROXIDE-SIMETH 200-200-20 MG/5ML PO SUSP
30.0000 mL | Freq: Once | ORAL | Status: AC
  Administered 2024-06-30: 30 mL via ORAL
  Filled 2024-06-30: qty 30

## 2024-06-30 NOTE — Progress Notes (Signed)
 Pt taking Omeprazole  currently and PCP recently increased dosage.

## 2024-06-30 NOTE — ED Provider Notes (Signed)
 [place] EMERGENCY DEPARTMENT AT [facility] Provider Note   CSN: [id number] Arrival date & time: 06/30/24  1055     Patient presents with: Chest Pain   Denise Park is a 68 y.o. female.   Pt is a 68 yo female with pmhx significant for GERD, migraines, depression, arthritis, and IBS.  Pt has been having issues with GERD for several months.  She is followed by Dr. Avram ([facility]).  She was started on omeprazole  20 mg daily in November 2024.  She had an EGD with dilation in December 2024.  She has been on omeprazole  20 mg daily since then.  She has continued to have severe burning after she eats or drinks.  She is not eating/drinking as much.  She did call GI on 7/2 and was told to increase her omeprazole  to 40 mg BID.  She's been doing this without any improvement in sx.  Pt went to UC today and was told to come here for further eval.  Pt denies sob.  She does have pain to the upper left part of her chest.        Prior to Admission medications   Medication Sig Start Date End Date Taking? Authorizing Provider  doxycycline  (VIBRAMYCIN ) 100 MG capsule Take 1 capsule (100 mg total) by mouth 2 (two) times daily. 06/30/24  Yes Dean Clarity, MD  esomeprazole  (NEXIUM ) 40 MG capsule Take 1 capsule (40 mg total) by mouth daily. 06/30/24  Yes Dean Clarity, MD  ALPRAZolam  (XANAX ) 0.25 MG tablet Take 1 tablet (0.25 mg total) by mouth 2 (two) times daily as needed for anxiety. 11/28/19   Norleen Lynwood ORN, MD  buPROPion  ER (WELLBUTRIN  SR) 100 MG 12 hr tablet TAKE 1 TABLET BY MOUTH TWICE A DAY 06/28/23   Norleen Lynwood ORN, MD  busPIRone  (BUSPAR ) 10 MG tablet TAKE 1 TABLET BY MOUTH THREE TIMES A DAY AS NEEDED 01/25/20   Norleen Lynwood ORN, MD  CALCIUM-VITAMIN D  PO Take by mouth daily.    [provider]  cetirizine  (ZYRTEC ) 10 MG tablet TAKE 1 TABLET(10 MG) BY MOUTH DAILY 10/02/21   Norleen Lynwood ORN, MD  denosumab  (PROLIA ) 60 MG/ML SOSY injection Inject 60 mg into the skin every 6 (six) months.  06/22/24   Norleen Lynwood ORN, MD  diclofenac  Sodium (VOLTAREN ) 1 % GEL Apply 2 g topically 4 (four) times daily. 05/21/23   Norleen Lynwood ORN, MD  gabapentin  (NEURONTIN ) 100 MG capsule Take 1 capsule (100 mg total) by mouth at bedtime. 04/10/24   Evans, Brent M, DPM  IMVEXXY MAINTENANCE PACK 4 MCG INST Place 1 capsule vaginally 2 (two) times a week. 03/25/20   [provider]  meloxicam  (MOBIC ) 15 MG tablet TAKE 1 TABLET BY MOUTH EVERY DAY AS NEEDED FOR PAIN 02/23/24   Norleen Lynwood ORN, MD  tretinoin (RETIN-A) 0.05 % cream APPLY TO AFFECTED AREA EVERY DAY AT BEDTIME    [provider]  triamcinolone  (NASACORT ) 55 MCG/ACT AERO nasal inhaler Place 2 sprays into the nose daily. 05/21/23   Norleen Lynwood ORN, MD  triamcinolone  cream (KENALOG ) 0.5 % Apply topically 2 (two) times daily. Apply to areas under the tongue twice daily for 2 weeks 10/19/23   Tobie Comp B, MD  valACYclovir  (VALTREX ) 500 MG tablet Take 1 tablet (500 mg total) by mouth daily. 11/16/23   Norleen Lynwood ORN, MD  zolpidem  (AMBIEN ) 10 MG tablet TAKE 1 TABLET BY MOUTH EVERY DAY AT BEDTIME AS NEEDED 05/25/24   Norleen Lynwood  W, MD    Allergies: Patient has no known allergies.    Review of Systems  Cardiovascular:  Positive for chest pain.  Gastrointestinal:        Burning after eating  All other systems reviewed and are negative.   Updated Vital Signs BP (!) 130/109   Pulse 70   Temp (!) 97.2 F (36.2 C) (Oral)   Resp 18   LMP 03/31/2011   SpO2 100%   Physical Exam Vitals and nursing note reviewed.  Constitutional:      Appearance: She is well-developed.  HENT:     Head: Normocephalic and atraumatic.  Eyes:     Extraocular Movements: Extraocular movements intact.     Pupils: Pupils are equal, round, and reactive to light.  Cardiovascular:     Rate and Rhythm: Normal rate and regular rhythm.     Heart sounds: Normal heart sounds.  Pulmonary:     Effort: Pulmonary effort is normal.     Breath sounds: Normal breath sounds.   Abdominal:     General: Bowel sounds are normal.     Palpations: Abdomen is soft.  Musculoskeletal:        General: Normal range of motion.     Cervical back: Normal range of motion and neck supple.  Skin:    General: Skin is warm.     Capillary Refill: Capillary refill takes less than 2 seconds.  Neurological:     General: No focal deficit present.     Mental Status: She is alert and oriented to person, place, and time.  Psychiatric:        Mood and Affect: Mood normal.        Behavior: Behavior normal.     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 Multiplanar CT image reconstructions and MIPs were obtained to evaluate the vascular anatomy. RADIATION DOSE REDUCTION: This exam was performed according to the departmental dose-optimization program which includes automated exposure control, adjustment of the mA and/or kV according to  patient size and/or use of iterative reconstruction technique. CONTRAST:  75mL OMNIPAQUE  IOHEXOL  350 MG/ML SOLN COMPARISON:  Chest x-ray June 30, 2024 FINDINGS: Cardiovascular: Satisfactory opacification of the pulmonary arteries to the segmental level. No evidence of pulmonary embolism. Normal heart size. No pericardial effusion. Mediastinum/Nodes: No enlarged mediastinal, hilar, or axillary lymph nodes. Thyroid  gland, trachea, and esophagus demonstrate no significant findings. Lungs/Pleura: Cluster of subpleural bronchocentric tree-in-bud pulmonary nodules in  posterior aspect of right lower lobe (6/75). No ground-glass attenuation. No consolidation, significant emphysematous changes or honeycombing. No architectural distortion. Mild bronchial and bronchiolar wall thickening. Mild periosteophyte fibrosis in posteromedial right lower lobe (6/116) No pericardial effusion. Upper Abdomen: No acute abnormality. Musculoskeletal: No chest wall abnormality. No acute or significant osseous findings. Review of the MIP images confirms the above findings. IMPRESSION: No suspicious findings to suggest pulmonary embolism. Cluster of bronchocentric tree-in-bud micro nodules in subpleural right lower lobe may represent sequela from infection/inflammation. No consolidation or pleural effusion. No follow-up needed if patient is low-risk (and has no known or suspected primary neoplasm). Non-contrast chest CT can be considered in 12 months if patient is high-risk. This recommendation follows the consensus statement: Guidelines for Management of Incidental Pulmonary Nodules Detected on CT Images: From the Fleischner Society 2017; Radiology 2017; 284:228-243. Electronically Signed   By: Megan  Zare M.D.   On: 06/30/2024 13:53   DG Chest 2 View Result Date: 06/30/2024 CLINICAL DATA:  Left-sided chest pain. EXAM: CHEST - 2 VIEW COMPARISON:  08/16/2023 FINDINGS: The heart size and mediastinal contours are within normal limits. Both  lungs are clear. Stable mild thoracic scoliosis. IMPRESSION: No active cardiopulmonary disease. Electronically Signed   By: Norleen DELENA Kil M.D.   On: 06/30/2024 11:51     Procedures   Medications Ordered in the ED  iohexol  (OMNIPAQUE ) 350 MG/ML injection 75 mL (75 mLs Intravenous Contrast Given 06/30/24 1245)  alum & mag hydroxide-simeth (MAALOX/MYLANTA) 200-200-20 MG/5ML suspension 30 mL (30 mLs Oral Given 06/30/24 1341)                                    Medical Decision Making Amount and/or Complexity of Data Reviewed Labs: ordered. Radiology: ordered.  Risk OTC drugs. Prescription drug management.   This patient presents to the ED for concern of cp, this involves an extensive number of treatment options, and is a complaint that carries with it a high risk of complications and morbidity.  The differential diagnosis includes cardiac, pulm, gi   Co morbidities that complicate the patient evaluation  GERD, migraines, depression, arthritis, and IBS   Additional history obtained:  Additional history obtained from epic chart review  Lab Tests:  I Ordered, and personally interpreted labs.  The pertinent results include:  cbc nl, bmp nl other than mild elevation in cr 1.05 (Cr 0.88 7 months ago), trop nl   Imaging Studies ordered:  I ordered imaging studies including cxr and ct chest  I independently visualized and interpreted imaging which showed  CXR: No active cardiopulmonary disease.  CT chest: No suspicious findings to suggest pulmonary embolism.    Cluster of bronchocentric tree-in-bud micro nodules in subpleural  right lower lobe may represent sequela from infection/inflammation.  No consolidation or pleural effusion.    No follow-up needed if patient is low-risk (and has no known or  suspected primary neoplasm). Non-contrast chest CT can be considered  in 12 months if patient is high-risk. This recommendation follows  the consensus statement: Guidelines for  Management of Incidental  Pulmonary Nodules Detected on CT Images: From the Fleischner Society  2017; Radiology 2017; 284:228-243.   I agree with the radiologist interpretation   Cardiac Monitoring:  The patient was maintained on a cardiac monitor.  I personally viewed and interpreted the cardiac monitored which showed an underlying rhythm of: nsr   Medicines ordered and prescription drug management:  I ordered medication including gi cocktail  for sx  Reevaluation of the patient after these medicines showed that the patient improved I have reviewed the patients home medicines and have made adjustments as needed   Test Considered:  ct   Critical Interventions:  meds  Problem List / ED Course:  CP:  likely GERD as it's described as a burning pain after eating/drinking.  She will be changed from omeprazole  to nexium .  She is to f/u with GI. Bronchitis on CT:  pt has had a cough.  I don't think this is the cause of her cp, but I will d/c her with doxy.   Reevaluation:  After the interventions noted above, I reevaluated the patient and found that they have :improved   Social Determinants of Health:  Lives at home   Dispostion:  After consideration of the diagnostic results and the patients response to treatment, I feel that the patent would benefit from discharge with outpatient f/u.       Final diagnoses:  Atypical chest pain  Gastroesophageal reflux disease, unspecified whether esophagitis present  Bronchitis    ED Discharge Orders          Ordered    esomeprazole  (NEXIUM ) 40 MG capsule  Daily        06/30/24 1436    doxycycline  (VIBRAMYCIN ) 100 MG capsule  2 times daily        06/30/24 1436               Dean Clarity, MD 06/30/24 1436

## 2024-06-30 NOTE — ED Triage Notes (Signed)
 Pt c/o chest pain for quite some time now to L side of chest. Worsening after eating. Sent from PCP for further testing

## 2024-06-30 NOTE — ED Triage Notes (Signed)
 Pt reports had issues with acid reflux and called GI last week and doubled her medication which hasn't helped. Reports burns across chest after eats or drinks. Reports this has gotten in past few days even after taking medications.

## 2024-06-30 NOTE — Discharge Instructions (Signed)
  Please report to [facility] for further evaluation.   [address] [place], [place]

## 2024-06-30 NOTE — Telephone Encounter (Signed)
 FYI Only or Action Required?: FYI only for provider.  Patient was last seen in primary care on 03/21/2024 by Norleen Lynwood ORN, MD.  Called Nurse Triage reporting Chest Pain.  Symptoms began several weeks ago.  Interventions attempted: Prescription medications: prilosec.  Symptoms are: gradually worsening.  Triage Disposition: Go to ED Now (or PCP Triage)  Patient/caregiver understands and will follow disposition?: No, wishes to speak with PCP  Pt refusing ED, States she will go to the UC instead            Copied from CRM [id number]. Topic: Clinical - Red Word Triage >> Jun 30, 2024  9:24 AM Chasity T wrote: [facility] that prompted transfer to Nurse Triage: patient is stating chest pain when eating or drinking. Reason for Disposition  [1] Chest pain lasts > 5 minutes AND [2] occurred in past 3 days (72 hours) (Exception: Feels exactly the same as previously diagnosed heartburn and has accompanying sour taste in mouth.)  Answer Assessment - Initial Assessment Questions 1. LOCATION: Where does it hurt?       Across chest 2. RADIATION: Does the pain go anywhere else? (e.g., into neck, jaw, arms, back)     Left shoulder  3. ONSET: When did the chest pain begin? (Minutes, hours or days)      Several weeks but has worsened in the past couple week 4. PATTERN: Does the pain come and go, or has it been constant since it started?  Does it get worse with exertion?      Only after she has swallowed food or drink 5. DURATION: How long does it last (e.g., seconds, minutes, hours)     Just while eating or drinking 6. SEVERITY: How bad is the pain?  (e.g., Scale 1-10; mild, moderate, or severe)     9/10 when eating 7. CARDIAC RISK FACTORS: Do you have any history of heart problems or risk factors for heart disease? (e.g., angina, prior heart attack; diabetes, high blood pressure, high cholesterol, smoker, or strong family history of heart disease)     no 8. PULMONARY RISK  FACTORS: Do you have any history of lung disease?  (e.g., blood clots in lung, asthma, emphysema, birth control pills)     no 9. CAUSE: What do you think is causing the chest pain?     unknown 10. OTHER SYMPTOMS: Do you have any other symptoms? (e.g., dizziness, nausea, vomiting, sweating, fever, difficulty breathing, cough)       Nausea sometimes but not when having the pain  Protocols used: Chest Pain-A-AH

## 2024-06-30 NOTE — Telephone Encounter (Signed)
 Medication has not arrived as of current moment.

## 2024-06-30 NOTE — ED Provider Notes (Signed)
 [facility]    CSN: [id number] Arrival date & time: 06/30/24  0954      History   Chief Complaint Chief Complaint  Patient presents with   Chest Pain    HPI Denise Park is a 68 y.o. female.   Patient presents today for evaluation of substernal chest pain that started recently.  She reports that she has known acid reflux and so she called her GI doctor last week and they doubled her medication for reflux but this has not been helpful.  She reports that she has significant pain across her chest starting on the left side and radiating to the right after she eats and drinks anything.  She reports this is affected how much she has been eating.  She states symptoms seem to be worsening instead of improving.  She does not report fever.  She has had some nausea.  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  She does report that she recently had a dilation of her esophagus secondary to narrowing from acid reflux.  The history is provided by the patient.  Chest Pain Associated symptoms: nausea   Associated symptoms: no abdominal pain, no fever, no shortness of breath and no vomiting     Past Medical History:  Diagnosis Date   ALLERGIC RHINITIS 10/30/2007   ANEMIA-IRON DEFICIENCY 10/30/2007   ANXIETY 10/30/2007   Arthritis    BACK PAIN 02/13/2008   Cataract    COMMON MIGRAINE 10/30/2007   DEPRESSION 10/30/2007   GERD 10/30/2007   HYPERSOMNIA 09/11/2008   INSOMNIA-SLEEP DISORDER-UNSPEC 09/11/2008   IRRITABLE BOWEL SYNDROME, HX OF 10/30/2007   OSTEOPENIA 02/13/2008   Osteopenia 05/22/2011   Osteoporosis    PELVIC PAIN, CHRONIC 05/14/2009   SINUSITIS- ACUTE-NOS 02/13/2008    Patient Active Problem List   Diagnosis Date Noted   Hair loss 03/24/2024   Hot flashes 03/24/2024   Pedal edema 03/24/2024   Female stress incontinence 01/10/2024   Tongue lesion 11/20/2023   Vitamin D  deficiency 11/20/2023    Hyperglycemia 11/20/2023   B12 deficiency 11/20/2023   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   Right knee pain 05/22/2023   Fatigue 11/11/2022   Acute lower respiratory tract infection 12/01/2021   Eustachian tube disorder, left 10/01/2021   Atrophic vaginitis 05/29/2020   Menopausal symptom 05/29/2020   Postmenopausal bleeding 05/29/2020   Arthritis 11/28/2019   Osteoporosis 11/25/2018   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      Home Medications    Prior to Admission medications   Medication Sig Start Date End Date Taking? Authorizing Provider  ALPRAZolam  (XANAX ) 0.25 MG tablet Take 1 tablet (0.25 mg total) by mouth 2 (two) times daily  as needed for anxiety. 11/28/19   Norleen Lynwood ORN, MD  buPROPion  ER (WELLBUTRIN  SR) 100 MG 12 hr tablet TAKE 1 TABLET BY MOUTH TWICE A DAY 06/28/23   Norleen Lynwood ORN, MD  busPIRone  (BUSPAR ) 10 MG tablet TAKE 1 TABLET BY MOUTH THREE TIMES A DAY AS NEEDED 01/25/20   Norleen Lynwood ORN, MD  CALCIUM-VITAMIN D  PO Take by mouth daily.    [provider]  cetirizine  (ZYRTEC ) 10 MG tablet TAKE 1 TABLET(10 MG) BY MOUTH DAILY 10/02/21   Norleen Lynwood ORN, MD  denosumab  (PROLIA ) 60 MG/ML SOSY injection Inject 60 mg into the skin every 6 (six) months. 06/22/24   Norleen Lynwood ORN, MD  diclofenac  Sodium (VOLTAREN ) 1 %  GEL Apply 2 g topically 4 (four) times daily. 05/21/23   Norleen Lynwood ORN, MD  gabapentin  (NEURONTIN ) 100 MG capsule Take 1 capsule (100 mg total) by mouth at bedtime. 04/10/24   Evans, Brent M, DPM  IMVEXXY MAINTENANCE PACK 4 MCG INST Place 1 capsule vaginally 2 (two) times a week. 03/25/20   [provider]  meloxicam  (MOBIC ) 15 MG tablet TAKE 1 TABLET BY MOUTH EVERY DAY AS NEEDED FOR PAIN 02/23/24   Norleen Lynwood ORN, MD  omeprazole  (PRILOSEC) 40 MG capsule Take 1 capsule (40 mg total) by mouth 2 (two) times daily before a meal. 06/21/24   Avram Lupita BRAVO, MD  tretinoin (RETIN-A) 0.05 % cream APPLY TO AFFECTED AREA EVERY DAY AT BEDTIME    [provider]  triamcinolone  (NASACORT ) 55 MCG/ACT AERO nasal inhaler Place 2 sprays into the nose daily. 05/21/23   Norleen Lynwood ORN, MD  triamcinolone  cream (KENALOG ) 0.5 % Apply topically 2 (two) times daily. Apply to areas under the tongue twice daily for 2 weeks 10/19/23   Tobie Eldora NOVAK, MD  valACYclovir  (VALTREX ) 500 MG tablet Take 1 tablet (500 mg total) by mouth daily. 11/16/23   Norleen Lynwood ORN, MD  zolpidem  (AMBIEN ) 10 MG tablet TAKE 1 TABLET BY MOUTH EVERY DAY AT BEDTIME AS NEEDED 05/25/24   Norleen Lynwood ORN, MD    Family History Family History  Problem Relation Age of Onset   Heart disease Father    Coronary artery disease Other        Female 1st degree relative, female 1st degree relative <60 sister   Depression Other    Colon polyps Neg Hx    Colon cancer Neg Hx    Esophageal cancer Neg Hx    Rectal cancer Neg Hx    Stomach cancer Neg Hx     Social History Social History   Tobacco Use   Smoking status: Never   Smokeless tobacco: Never  Substance Use Topics   Alcohol use: Yes   Drug use: No     Allergies   Patient has no known allergies.   Review of Systems Review of Systems  Constitutional:  Negative for chills and fever.  Eyes:  Negative for discharge and redness.  Respiratory:  Negative for shortness of breath.    Cardiovascular:  Positive for chest pain.  Gastrointestinal:  Positive for nausea. Negative for abdominal pain and vomiting.     Physical Exam Triage Vital Signs ED Triage Vitals  Encounter Vitals Group     BP 06/30/24 1007 134/87     Girls Systolic BP Percentile --      Girls Diastolic BP Percentile --      Boys Systolic BP Percentile --      Boys Diastolic BP Percentile --  Pulse Rate 06/30/24 1007 71     Resp 06/30/24 1007 16     Temp 06/30/24 1007 97.8 F (36.6 C)     Temp Source 06/30/24 1007 Oral     SpO2 06/30/24 1007 97 %     Weight --      Height --      Head Circumference --      Peak Flow --      Pain Score 06/30/24 1006 0     Pain Loc --      Pain Education --      Exclude from Growth Chart --    No data found.  Updated Vital Signs BP 134/87 (BP Location: Left Arm)   Pulse 71   Temp 97.8 F (36.6 C) (Oral)   Resp 16   LMP 03/31/2011   SpO2 97%   Visual Acuity Right Eye Distance:   Left Eye Distance:   Bilateral Distance:    Right Eye Near:   Left Eye Near:    Bilateral Near:     Physical Exam Vitals and nursing note reviewed.  Constitutional:      General: She is not in acute distress.    Appearance: Normal appearance. She is not ill-appearing.  HENT:     Head: Normocephalic and atraumatic.  Eyes:     Conjunctiva/sclera: Conjunctivae normal.  Cardiovascular:     Rate and Rhythm: Normal rate and regular rhythm.  Pulmonary:     Effort: Pulmonary effort is normal. No respiratory distress.     Breath sounds: No wheezing, rhonchi or rales.  Chest:     Chest wall: No tenderness.  Abdominal:     General: Abdomen is flat. Bowel sounds are normal. There is no distension.     Palpations: Abdomen is soft.     Tenderness: There is no abdominal tenderness. There is no guarding or rebound.  Neurological:     Mental Status: She is alert.      UC Treatments / Results  Labs (all labs ordered are listed, but only abnormal results are  displayed) Labs Reviewed - No data to display  EKG   Radiology No results found.  Procedures Procedures (including critical care time)  Medications Ordered in UC Medications - No data to display  Initial Impression / Assessment and Plan / UC Course  I have reviewed the triage vital signs and the nursing notes.  Pertinent labs & imaging results that were available during my care of the patient were reviewed by me and considered in my medical decision making (see chart for details).    EKG similar to prior.  Recommended further evaluation in the emergency room for stat labs and possible imaging given GI concerns.  Patient expresses understanding and is agreeable with plan.  She is stable to transport via POV.  Final Clinical Impressions(s) / UC Diagnoses   Final diagnoses:  Chest pain, unspecified type  Nausea without vomiting     Discharge Instructions       Please report to [facility] for further evaluation.   [id number] [place], [place]       ED Prescriptions   None    PDMP not reviewed this encounter.   Billy Asberry FALCON, PA-C 06/30/24 1037

## 2024-06-30 NOTE — Discharge Instructions (Addendum)
 Your Chest CT will need to be repeated in 1 year.  Your PCP can order this.

## 2024-07-03 ENCOUNTER — Telehealth: Payer: Self-pay | Admitting: Internal Medicine

## 2024-07-03 NOTE — Telephone Encounter (Signed)
 Pharmacy stated that her Prolia  was approved and received Prolia  will delivered to us  by 7.22.25.  FYI

## 2024-07-12 NOTE — Telephone Encounter (Signed)
 Per note below from PA team, patient will owe $20 at time of visit.   Teressa Romualdo DASEN, CPhT  Cleatus Suzen SAILOR, RN Replies will be sent to [facility] Team 15% is approximately $20

## 2024-07-12 NOTE — Telephone Encounter (Signed)
 Prolia  received yesterday 07/11/24. Will contact patient to inform them of when to come in, after confirming if there will be any OOP through the office

## 2024-07-14 ENCOUNTER — Ambulatory Visit (INDEPENDENT_AMBULATORY_CARE_PROVIDER_SITE_OTHER)

## 2024-07-14 DIAGNOSIS — M810 Age-related osteoporosis without current pathological fracture: Secondary | ICD-10-CM

## 2024-07-14 MED ORDER — DENOSUMAB 60 MG/ML ~~LOC~~ SOSY: 60.0000 mg | PREFILLED_SYRINGE | Freq: Once | SUBCUTANEOUS | Status: AC

## 2024-07-14 NOTE — Telephone Encounter (Signed)
 Called pt and schedule her for Prolia  injection on 07/14/24

## 2024-07-14 NOTE — Progress Notes (Signed)
 Pt received Prolia  injection w/o complication.   Next dose ordered.

## 2024-07-19 ENCOUNTER — Telehealth: Payer: Self-pay | Admitting: Internal Medicine

## 2024-07-19 NOTE — Telephone Encounter (Signed)
 Patient called and stated that she is would to speak with a nurse because ever since she had an endoscopy she has been seen at the ED with GERD and bronchitis. Patient stated that over at the ED they believe she developed bronchitis from having GERD. Patient also stated she has been developing a cough as well as pain in her esophagus. Patient is requesting a call back. Please advise.

## 2024-07-19 NOTE — Telephone Encounter (Signed)
 Spoke with patient. She increased her omeprazole  to 40 mg BID as instructed on 06/21/24. She went to the UC and was sent on to ER due to chest pain and cough. She was determined to have bronchitis as well as GERD. Got better on the 7 day course of antibiotics.  She now has a return of her symptoms of chest burning and a slight cough. Her PPI was changed to daily esomeprazole  40 mg. She is scheduled for follow up with Harlene Mail, PA on 08/02/24. We reviewed reflux precautions and diet. Any recommendations?

## 2024-07-20 NOTE — Telephone Encounter (Signed)
Called to discuss.  No answer.  No voicemail.

## 2024-07-20 NOTE — Telephone Encounter (Signed)
 Please Rx carafate 1 g qid ac/hs # 120 no refill

## 2024-08-02 ENCOUNTER — Ambulatory Visit: Admitting: Gastroenterology

## 2024-08-02 ENCOUNTER — Encounter: Payer: Self-pay | Admitting: Gastroenterology

## 2024-08-02 VITALS — BP 110/72 | HR 78 | Ht 67.0 in | Wt 150.0 lb

## 2024-08-02 DIAGNOSIS — K219 Gastro-esophageal reflux disease without esophagitis: Secondary | ICD-10-CM

## 2024-08-02 DIAGNOSIS — K121 Other forms of stomatitis: Secondary | ICD-10-CM | POA: Diagnosis not present

## 2024-08-02 DIAGNOSIS — R1319 Other dysphagia: Secondary | ICD-10-CM

## 2024-08-02 DIAGNOSIS — R0789 Other chest pain: Secondary | ICD-10-CM | POA: Diagnosis not present

## 2024-08-02 DIAGNOSIS — B009 Herpesviral infection, unspecified: Secondary | ICD-10-CM | POA: Diagnosis not present

## 2024-08-02 MED ORDER — ESOMEPRAZOLE MAGNESIUM 40 MG PO CPDR: 40.0000 mg | DELAYED_RELEASE_CAPSULE | Freq: Two times a day (BID) | ORAL | 5 refills | Status: AC

## 2024-08-02 MED ORDER — VALACYCLOVIR HCL 500 MG PO TABS: 500.0000 mg | ORAL_TABLET | Freq: Two times a day (BID) | ORAL | 0 refills | Status: DC

## 2024-08-02 NOTE — Patient Instructions (Addendum)
 We have sent the following medications to your pharmacy for you to pick up at your convenience: Nexium  40 mg twice daily. Valtrex  500 mg 1-2 times daily.  _______________________________________________________  If your blood pressure at your visit was 140/90 or greater, please contact your primary care physician to follow up on this.  _______________________________________________________  If you are age 66 or older, your body mass index should be between 23-30. Your Body mass index is 23.49 kg/m. If this is out of the aforementioned range listed, please consider follow up with your Primary Care Provider.  If you are age 32 or younger, your body mass index should be between 19-25. Your Body mass index is 23.49 kg/m. If this is out of the aformentioned range listed, please consider follow up with your Primary Care Provider.   ________________________________________________________  The [facility] providers would like to encourage you to use MYCHART to communicate with providers for non-urgent requests or questions.  Due to long hold times on the telephone, sending your provider a message by [facility] may be a faster and more efficient way to get a response.  Please allow 48 business hours for a response.  Please remember that this is for non-urgent requests.  _______________________________________________________  [facility] is using a team-based approach to care.  Your team is made up of your doctor and two to three APPS. Our APPS (Nurse Practitioners and Physician Assistants) work with your physician to ensure care continuity for you. They are fully qualified to address your health concerns and develop a treatment plan. They communicate directly with your gastroenterologist to care for you. Seeing the Advanced Practice Practitioners on your physician's team can help you by facilitating care more promptly, often allowing for earlier appointments, access to diagnostic testing,  procedures, and other specialty referrals.

## 2024-08-02 NOTE — Progress Notes (Unsigned)
 08/10/2024 Denise Park [id number] 1956-01-06   HISTORY OF PRESENT ILLNESS: This is a 68 year old female is a patient of Dr. Darilyn.  She was last seen here in November 2024 by one of our other PAs, Amy Esterwood, [employer].  Then she had an EGD in December as below.  Last month she had a flare of what she thought was reflux symptoms and had burning pain in her chest.  She says the pain felt like burning like it was raw and would hurt worse after eating or drinking.  She went to urgent care and they sent her to the ED.  Imaging revealed what they called bronchitis and treated her with antibiotics.  PPI was changed to Nexium .  She had sent a message here and we added liquid Carafate.  She tells me that after the onset of the reflux symptoms and the burning chest pain that she developed oral ulcers.  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  EGD 11/2023:  - LA Grade A reflux esophagitis with no bleeding. Biopsied. - Esophageal stenosis. Dilated. 20 mm - no effect - Benign- appearing esophageal stenosis - proximal esophagus. Dilated. 18 mm w/ effect - Small sliding hiatal hernia. - The examination was otherwise normal.  FINAL DIAGNOSIS       1. Surgical [P], GE junction :      -  SQUAMOCOLUMNAR JUNCTIONAL MUCOSA WITH SIGNIFICANTLY INCREASED INTRAEPITHELIAL      LYMPHOCYTES AND FOCAL EROSION.      NOTE:  THE BIOPSY CONSISTS OF SQUAMOCOLUMNAR JUNCTIONAL MUCOSA WITH PROMINENT      BASAL CELL HYPERPLASIA, PAPILLARY ELONGATION, REACTIVE/REPARATIVE CHANGE AND THE      PRESENCE OF INCREASED INTRAEPITHELIAL LYMPHOCYTES, WALL THE OVERALL APPEARANCE      MOST LIKELY REPRESENTS REFLUX ESOPHAGITIS WITH EVIDENCE OF FOCAL EROSION, GIVEN      THE LEVEL OF INTRAEPITHELIAL LYMPHOCYTES OTHER ETIOLOGIES INCLUDING DYSMOTILITY      DISORDERS AS WELL AS LICHEN PLANUS ARE ALSO CONSIDERED.  IMMUNOHISTOCHEMICAL      STAINS FOR HSV I/II AND CMV ARE NEGATIVE.  A PAS STAIN IS  NEGATIVE FOR FUNGAL      FORMS.   Past Medical History:  Diagnosis Date   ALLERGIC RHINITIS 10/30/2007   ANEMIA-IRON DEFICIENCY 10/30/2007   ANXIETY 10/30/2007   Arthritis    BACK PAIN 02/13/2008   Cataract    COMMON MIGRAINE 10/30/2007   DEPRESSION 10/30/2007   GERD 10/30/2007   HYPERSOMNIA 09/11/2008   INSOMNIA-SLEEP DISORDER-UNSPEC 09/11/2008   IRRITABLE BOWEL SYNDROME, HX OF 10/30/2007   OSTEOPENIA 02/13/2008   Osteopenia 05/22/2011   Osteoporosis    PELVIC PAIN, CHRONIC 05/14/2009   SINUSITIS- ACUTE-NOS 02/13/2008   Past Surgical History:  Procedure Laterality Date   APPENDECTOMY     age 7   CESAREAN SECTION     COLONOSCOPY     ESOPHAGOGASTRODUODENOSCOPY     TUBAL LIGATION      reports that she has never smoked. She has never used smokeless tobacco. She reports current alcohol use. She reports that she does not use drugs. family history includes Coronary artery disease in an other family member; Depression in an other family member; Heart disease in her father; Suicidality in her mother. No Known Allergies    Outpatient Encounter Medications as of 08/02/2024  Medication Sig   ALPRAZolam  (XANAX ) 0.25 MG tablet Take 1 tablet (0.25 mg total) by mouth 2 (two) times daily as needed for anxiety.  buPROPion  ER (WELLBUTRIN  SR) 100 MG 12 hr tablet TAKE 1 TABLET BY MOUTH TWICE A DAY   busPIRone  (BUSPAR ) 10 MG tablet TAKE 1 TABLET BY MOUTH THREE TIMES A DAY AS NEEDED   CALCIUM-VITAMIN D  PO Take by mouth daily.   cetirizine  (ZYRTEC ) 10 MG tablet TAKE 1 TABLET(10 MG) BY MOUTH DAILY (Patient taking differently: Take 10 mg by mouth daily as needed.)   clobetasol (TEMOVATE) 0.05 % external solution Apply 1 Application topically daily as needed.   denosumab  (PROLIA ) 60 MG/ML SOSY injection Inject 60 mg into the skin every 6 (six) months.   diclofenac  Sodium (VOLTAREN ) 1 % GEL Apply 2 g topically 4 (four) times daily.   IMVEXXY MAINTENANCE PACK 4 MCG INST Place 1 capsule vaginally  2 (two) times a week.   meloxicam  (MOBIC ) 15 MG tablet TAKE 1 TABLET BY MOUTH EVERY DAY AS NEEDED FOR PAIN   tretinoin (RETIN-A) 0.05 % cream APPLY TO AFFECTED AREA EVERY DAY AT BEDTIME   triamcinolone  (NASACORT ) 55 MCG/ACT AERO nasal inhaler Place 2 sprays into the nose daily. (Patient taking differently: Place 2 sprays into the nose daily as needed.)   zolpidem  (AMBIEN ) 10 MG tablet TAKE 1 TABLET BY MOUTH EVERY DAY AT BEDTIME AS NEEDED   [DISCONTINUED] esomeprazole  (NEXIUM ) 40 MG capsule Take 1 capsule (40 mg total) by mouth daily.   [DISCONTINUED] valACYclovir  (VALTREX ) 500 MG tablet Take 1 tablet (500 mg total) by mouth daily.   esomeprazole  (NEXIUM ) 40 MG capsule Take 1 capsule (40 mg total) by mouth 2 (two) times daily before a meal.   triamcinolone  cream (KENALOG ) 0.5 % Apply topically 2 (two) times daily. Apply to areas under the tongue twice daily for 2 weeks (Patient not taking: Reported on 08/02/2024)   valACYclovir  (VALTREX ) 500 MG tablet Take 1 tablet (500 mg total) by mouth 2 (two) times daily.   [DISCONTINUED] doxycycline  (VIBRAMYCIN ) 100 MG capsule Take 1 capsule (100 mg total) by mouth 2 (two) times daily.   [DISCONTINUED] gabapentin  (NEURONTIN ) 100 MG capsule Take 1 capsule (100 mg total) by mouth at bedtime.   Facility-Administered Encounter Medications as of 08/02/2024  Medication   [START ON 01/14/2025] denosumab  (PROLIA ) injection 60 mg    REVIEW OF SYSTEMS  : All other systems reviewed and negative except where noted in the History of Present Illness.   PHYSICAL EXAM: BP 110/72   Pulse 78   Ht 5' 7 (1.702 m)   Wt 150 lb (68 kg)   LMP 03/31/2011   BMI 23.49 kg/m  General: Well developed white female in no acute distress Head: Normocephalic and atraumatic Eyes:  Sclerae anicteric, conjunctiva pink. Ears: Normal auditory acuity Lungs: Clear throughout to auscultation; no W/R/R. Heart: Regular rate and rhythm; no M/R/G. Musculoskeletal: Symmetrical with no gross  deformities  Skin: No lesions on visible extremities Extremities: No edema  Neurological: Alert oriented x 4, grossly non-focal Psychological:  Alert and cooperative. Normal mood and affect  ASSESSMENT AND PLAN: *GERD/atypical chest pain: Had a flare of the symptoms.  After development of the symptoms she got oral lesions related to her HSV.  I wonder if the symptoms that she was experiencing was possibly some lesions in her esophagus.  Feeling better currently.  Continue Nexium  40 mg, but I am going to increase it to BID for now. *HSV 1: Takes valacyclovir  daily, but increases it to twice daily during a flare.  Because she increased it recently she is about to run out of her medication.  I agreed to refill it for her now, but going forward needs to come from her PCP.  Continue valacyclovir .    **Prescriptions sent to pharmacy. **Follow-up in about 2 months.   CC:  Norleen Lynwood ORN, MD

## 2024-08-10 ENCOUNTER — Encounter: Payer: Self-pay | Admitting: Gastroenterology

## 2024-08-10 DIAGNOSIS — R1319 Other dysphagia: Secondary | ICD-10-CM | POA: Insufficient documentation

## 2024-08-10 DIAGNOSIS — R0789 Other chest pain: Secondary | ICD-10-CM | POA: Insufficient documentation

## 2024-08-10 DIAGNOSIS — K121 Other forms of stomatitis: Secondary | ICD-10-CM | POA: Insufficient documentation

## 2024-08-29 ENCOUNTER — Other Ambulatory Visit: Payer: Self-pay | Admitting: Gastroenterology

## 2024-08-30 ENCOUNTER — Other Ambulatory Visit: Payer: Self-pay | Admitting: Medical Genetics

## 2024-08-31 ENCOUNTER — Ambulatory Visit: Admitting: Internal Medicine

## 2024-09-13 ENCOUNTER — Other Ambulatory Visit: Payer: Self-pay | Admitting: Internal Medicine

## 2024-09-28 ENCOUNTER — Telehealth: Payer: Self-pay

## 2024-09-28 NOTE — Telephone Encounter (Signed)
 Copied from CRM [id number]. Topic: Clinical - Medication Question >> Sep 28, 2024  1:06 PM Suzette B wrote: Reason for CRM: Denise Park (patient) called from [phone] to request scheduling of the prolia  shot please call patient to advise

## 2024-09-28 NOTE — Telephone Encounter (Signed)
 Called and let Pt know her last Prolia  was 07/14/24 so she is not due for another 3 months.

## 2024-10-02 ENCOUNTER — Encounter: Payer: Self-pay | Admitting: Internal Medicine

## 2024-10-02 ENCOUNTER — Ambulatory Visit: Admitting: Internal Medicine

## 2024-10-02 VITALS — BP 120/78 | HR 70 | Temp 98.6°F | Ht 67.0 in | Wt 147.0 lb

## 2024-10-02 DIAGNOSIS — R739 Hyperglycemia, unspecified: Secondary | ICD-10-CM | POA: Diagnosis not present

## 2024-10-02 DIAGNOSIS — K146 Glossodynia: Secondary | ICD-10-CM

## 2024-10-02 DIAGNOSIS — Z0000 Encounter for general adult medical examination without abnormal findings: Secondary | ICD-10-CM | POA: Diagnosis not present

## 2024-10-02 DIAGNOSIS — E538 Deficiency of other specified B group vitamins: Secondary | ICD-10-CM | POA: Diagnosis not present

## 2024-10-02 DIAGNOSIS — E559 Vitamin D deficiency, unspecified: Secondary | ICD-10-CM | POA: Diagnosis not present

## 2024-10-02 DIAGNOSIS — E7849 Other hyperlipidemia: Secondary | ICD-10-CM

## 2024-10-02 DIAGNOSIS — D509 Iron deficiency anemia, unspecified: Secondary | ICD-10-CM

## 2024-10-02 DIAGNOSIS — Z0001 Encounter for general adult medical examination with abnormal findings: Secondary | ICD-10-CM

## 2024-10-02 DIAGNOSIS — M810 Age-related osteoporosis without current pathological fracture: Secondary | ICD-10-CM | POA: Diagnosis not present

## 2024-10-02 LAB — LIPID PANEL
Cholesterol: 182 mg/dL (ref 0–200)
HDL: 56.9 mg/dL (ref >39.00)
LDL Cholesterol: 77 mg/dL (ref 0–99)
NonHDL: 124.87
Total CHOL/HDL Ratio: 3
Triglycerides: 238 mg/dL — ABNORMAL HIGH (ref 0.0–149.0)
VLDL: 47.6 mg/dL — ABNORMAL HIGH (ref 0.0–40.0)

## 2024-10-02 LAB — URINALYSIS, ROUTINE W REFLEX MICROSCOPIC
Bilirubin Urine: NEGATIVE
Hgb urine dipstick: NEGATIVE
Ketones, ur: NEGATIVE
Leukocytes,Ua: NEGATIVE
Nitrite: NEGATIVE
Specific Gravity, Urine: 1.01 (ref 1.000–1.030)
Total Protein, Urine: NEGATIVE
Urine Glucose: NEGATIVE
Urobilinogen, UA: 0.2 (ref 0.0–1.0)
pH: 6 (ref 5.0–8.0)

## 2024-10-02 LAB — BASIC METABOLIC PANEL WITH GFR
BUN: 13 mg/dL (ref 6–23)
CO2: 31 meq/L (ref 19–32)
Calcium: 9.9 mg/dL (ref 8.4–10.5)
Chloride: 102 meq/L (ref 96–112)
Creatinine, Ser: 0.87 mg/dL (ref 0.40–1.20)
GFR: 69.54 mL/min (ref >60.00)
Glucose, Bld: 92 mg/dL (ref 70–99)
Potassium: 4 meq/L (ref 3.5–5.1)
Sodium: 139 meq/L (ref 135–145)

## 2024-10-02 LAB — CBC WITH DIFFERENTIAL/PLATELET
Basophils Absolute: 0.1 K/uL (ref 0.0–0.1)
Basophils Relative: 0.7 % (ref 0.0–3.0)
Eosinophils Absolute: 0.2 K/uL (ref 0.0–0.7)
Eosinophils Relative: 2.1 % (ref 0.0–5.0)
HCT: 43.2 % (ref 36.0–46.0)
Hemoglobin: 14.2 g/dL (ref 12.0–15.0)
Lymphocytes Relative: 29.9 % (ref 12.0–46.0)
Lymphs Abs: 2.3 K/uL (ref 0.7–4.0)
MCHC: 32.9 g/dL (ref 30.0–36.0)
MCV: 89.2 fl (ref 78.0–100.0)
Monocytes Absolute: 0.7 K/uL (ref 0.1–1.0)
Monocytes Relative: 8.8 % (ref 3.0–12.0)
Neutro Abs: 4.5 K/uL (ref 1.4–7.7)
Neutrophils Relative %: 58.5 % (ref 43.0–77.0)
Platelets: 272 K/uL (ref 150.0–400.0)
RBC: 4.85 Mil/uL (ref 3.87–5.11)
RDW: 13 % (ref 11.5–15.5)
WBC: 7.7 K/uL (ref 4.0–10.5)

## 2024-10-02 LAB — HEPATIC FUNCTION PANEL
ALT: 15 U/L (ref 0–35)
AST: 22 U/L (ref 0–37)
Albumin: 4.6 g/dL (ref 3.5–5.2)
Alkaline Phosphatase: 69 U/L (ref 39–117)
Bilirubin, Direct: 0.1 mg/dL (ref 0.0–0.3)
Total Bilirubin: 0.4 mg/dL (ref 0.2–1.2)
Total Protein: 7.3 g/dL (ref 6.0–8.3)

## 2024-10-02 LAB — IBC PANEL
Iron: 64 ug/dL (ref 42–145)
Saturation Ratios: 27.2 % (ref 20.0–50.0)
TIBC: 235.2 ug/dL — ABNORMAL LOW (ref 250.0–450.0)
Transferrin: 168 mg/dL — ABNORMAL LOW (ref 212.0–360.0)

## 2024-10-02 LAB — HEMOGLOBIN A1C: Hgb A1c MFr Bld: 5.7 % (ref 4.6–6.5)

## 2024-10-02 LAB — TSH: TSH: 3 u[IU]/mL (ref 0.35–5.50)

## 2024-10-02 MED ORDER — ZOLPIDEM TARTRATE 10 MG PO TABS: ORAL_TABLET | ORAL | 1 refills | Status: AC

## 2024-10-02 MED ORDER — LIDOCAINE VISCOUS HCL 2 % MT SOLN: 15.0000 mL | OROMUCOSAL | 1 refills | Status: AC | PRN

## 2024-10-02 MED ORDER — BUSPIRONE HCL 10 MG PO TABS: 10.0000 mg | ORAL_TABLET | Freq: Three times a day (TID) | ORAL | 1 refills | Status: AC | PRN

## 2024-10-02 NOTE — Patient Instructions (Addendum)
 Please take OTC Vitamin D3 at 2000 units per day, indefinitely, or take Oscal Plus D three times per day with meals to get the calcium and Vit D together  Please take all new medication as prescribed - the viscous lidocaine  topical for mouth pain as needed  Please continue all other medications as before, and refills have been done for the Buspar  and ambien   Please have the pharmacy call with any other refills you may need.  Please continue your efforts at being more active, low cholesterol diet, and weight control.  You are otherwise up to date with prevention measures today.  Please keep your appointments with your specialists as you may have planned Please go to the LAB at the blood drawing area for the tests to be done  You will be contacted by phone if any changes need to be made immediately.  Otherwise, you will receive a letter about your results with an explanation, but please check with MyChart first.  Please make an Appointment to return in 6 months, or sooner if needed

## 2024-10-02 NOTE — Progress Notes (Unsigned)
 Patient ID: Denise Park, female   DOB: 1956/01/20, 68 y.o.   MRN: [id number]         Chief Complaint:: wellness exam and burning mouth, osteoporosis, low vit d,  low b12, hyperglycemia       HPI:  Denise Park is a 68 y.o. 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 Has not tried gabapentin  but viscous lidocaine  helped.  Pt denies chest pain, increased sob or doe, wheezing, orthopnea, PND, increased LE swelling, palpitations, dizziness or syncope.   Pt denies polydipsia, polyuria, or new focal neuro s/s.    Pt denies fever, wt loss, night sweats, loss of appetite, or other constitutional symptoms     Wt Readings from Last 3 Encounters:  10/02/24 147 lb (66.7 kg)  08/02/24 150 lb (68 kg)  04/10/24 151 lb (68.5 kg)   BP Readings from Last 3 Encounters:  10/02/24 120/78  08/02/24 110/72  06/30/24 (!) 130/109   Immunization History  Administered Date(s) Administered   Tdap 10/06/2017   Health Maintenance Due  Topic Date Due   Pneumococcal Vaccine: 50+ Years (1 of 1 - PCV) Never done   Zoster Vaccines- Shingrix (1 of 2) Never done   Mammogram  12/21/2016      Past Medical History:  Diagnosis Date   ALLERGIC RHINITIS 10/30/2007   ANEMIA-IRON DEFICIENCY 10/30/2007   ANXIETY 10/30/2007   Arthritis    BACK PAIN 02/13/2008   Cataract    COMMON MIGRAINE 10/30/2007   DEPRESSION 10/30/2007   GERD 10/30/2007   HYPERSOMNIA 09/11/2008   INSOMNIA-SLEEP DISORDER-UNSPEC 09/11/2008   IRRITABLE BOWEL SYNDROME, HX OF 10/30/2007   OSTEOPENIA 02/13/2008   Osteopenia 05/22/2011   Osteoporosis    PELVIC PAIN, CHRONIC 05/14/2009   SINUSITIS- ACUTE-NOS 02/13/2008   Past Surgical History:  Procedure Laterality Date   APPENDECTOMY     age 3   CESAREAN SECTION     COLONOSCOPY     ESOPHAGOGASTRODUODENOSCOPY      TUBAL LIGATION      reports that she has never smoked. She has never used smokeless tobacco. She reports current alcohol use. She reports that she does not use drugs. family history includes Coronary artery disease in an other family member; Depression in an other family member; Heart disease in her father; Suicidality in her mother. No Known Allergies Current Outpatient Medications on File Prior to Visit  Medication Sig Dispense Refill   buPROPion  ER (WELLBUTRIN  SR) 100 MG 12 hr tablet TAKE 1 TABLET BY MOUTH TWICE A DAY 180 tablet 2   CALCIUM-VITAMIN D  PO Take by mouth daily.     cetirizine  (ZYRTEC ) 10 MG tablet TAKE 1 TABLET(10 MG) BY MOUTH DAILY (Patient taking differently: Take 10 mg by mouth daily as needed.) 90 tablet 3   clobetasol (TEMOVATE) 0.05 % external solution Apply 1 Application topically daily as needed.     denosumab  (PROLIA ) 60 MG/ML SOSY injection Inject 60 mg into the skin every 6 (six) months. 1 mL 11   diclofenac  Sodium (VOLTAREN ) 1 % GEL Apply 2 g topically 4 (four) times daily. 100 g 5   esomeprazole  (NEXIUM ) 40 MG capsule Take 1 capsule (40 mg total) by mouth 2 (two) times  daily before a meal. 60 capsule 5   IMVEXXY MAINTENANCE PACK 4 MCG INST Place 1 capsule vaginally 2 (two) times a week.     meloxicam  (MOBIC ) 15 MG tablet TAKE 1 TABLET BY MOUTH EVERY DAY AS NEEDED FOR PAIN 90 tablet 2   MULTIPLE VITAMINS-MINERALS      tretinoin (RETIN-A) 0.05 % cream APPLY TO AFFECTED AREA EVERY DAY AT BEDTIME     triamcinolone  (NASACORT ) 55 MCG/ACT AERO nasal inhaler Place 2 sprays into the nose daily. (Patient taking differently: Place 2 sprays into the nose daily as needed.) 1 each 12   Current Facility-Administered Medications on File Prior to Visit  Medication Dose Route Frequency Provider Last Rate Last Admin   [START ON 01/14/2025] denosumab  (PROLIA ) injection 60 mg  60 mg Subcutaneous Once Norleen Lynwood ORN, MD            ROS:  All others reviewed and negative.  Objective         PE:  BP 120/78 (BP Location: Right Arm, Patient Position: Sitting, Cuff Size: Normal)   Pulse 70   Temp 98.6 F (37 C) (Oral)   Ht 5' 7 (1.702 m)   Wt 147 lb (66.7 kg)   LMP 03/31/2011   SpO2 94%   BMI 23.02 kg/m                 Constitutional: Pt appears in NAD               HENT: Head: NCAT.                Right Ear: External ear normal.                 Left Ear: External ear normal.                Eyes: . Pupils are equal, round, and reactive to light. Conjunctivae and EOM are normal               Nose: without d/c or deformity, mouth without lesion or swelling or redness               Neck: Neck supple. Gross normal ROM               Cardiovascular: Normal rate and regular rhythm.                 Pulmonary/Chest: Effort normal and breath sounds without rales or wheezing.                Abd:  Soft, NT, ND, + BS, no organomegaly               Neurological: Pt is alert. At baseline orientation, motor grossly intact               Skin: Skin is warm. No rashes, no other new lesions, LE edema - none               Psychiatric: Pt behavior is normal without agitation   Micro: none  Cardiac tracings I have personally interpreted today:  none  Pertinent Radiological findings (summarize): none   Lab Results  Component Value Date   WBC 7.7 10/02/2024   HGB 14.2 10/02/2024   HCT 43.2 10/02/2024   PLT 272.0 10/02/2024   GLUCOSE 92 10/02/2024   CHOL 182 10/02/2024   TRIG 238.0 (H) 10/02/2024   HDL 56.90 10/02/2024   LDLCALC 77 10/02/2024   ALT 15 10/02/2024  AST 22 10/02/2024   NA 139 10/02/2024   K 4.0 10/02/2024   CL 102 10/02/2024   CREATININE 0.87 10/02/2024   BUN 13 10/02/2024   CO2 31 10/02/2024   TSH 3.00 10/02/2024   HGBA1C 5.7 10/02/2024   Assessment/Plan:  Antionette Park is a 68 y.o. White or Caucasian [1] female with  has a past medical history of ALLERGIC RHINITIS (10/30/2007), ANEMIA-IRON DEFICIENCY (10/30/2007), ANXIETY (10/30/2007), Arthritis, BACK  PAIN (02/13/2008), Cataract, COMMON MIGRAINE (10/30/2007), DEPRESSION (10/30/2007), GERD (10/30/2007), HYPERSOMNIA (09/11/2008), INSOMNIA-SLEEP DISORDER-UNSPEC (09/11/2008), IRRITABLE BOWEL SYNDROME, HX OF (10/30/2007), OSTEOPENIA (02/13/2008), Osteopenia (05/22/2011), Osteoporosis, PELVIC PAIN, CHRONIC (05/14/2009), and SINUSITIS- ACUTE-NOS (02/13/2008).  Encounter for well adult exam with abnormal findings Age and sex appropriate education and counseling updated with regular exercise and diet Referrals for preventative services - has mammogram for jan 2026 Immunizations addressed - for prevnar 20 today Smoking counseling  - none needed Evidence for depression or other mood disorder - stable anxiety, declines need for change in tx Most recent labs reviewed. I have personally reviewed and have noted: 1) the patient's medical and social history 2) The patient's current medications and supplements 3) The patient's height, weight, and BMI have been recorded in the chart   Vitamin D  deficiency Last vitamin D  Lab Results  Component Value Date   VD25OH 29.19 (L) 10/02/2024   Low, to start oral replacement   Osteoporosis Pt tolerating prolia  well, to continue this and add oscal + D , wt bearing excercise  Hyperlipidemia Lab Results  Component Value Date   LDLCALC 77 10/02/2024   Stable, pt to continue low chol diet, wt control   Hyperglycemia Lab Results  Component Value Date   HGBA1C 5.7 10/02/2024   Stable, pt to continue current medical treatment  - diet, wt control   B12 deficiency Lab Results  Component Value Date   VITAMINB12 451 10/02/2024   Stable, cont oral replacement - b12 1000 mcg qd   Burning mouth syndrome Pt declines gabapentin  trial for now, ok for viscous lidocaine  prn limited rx  Followup: Return in about 6 months (around 04/02/2025).  Lynwood Rush, MD 10/05/2024 5:27 AM [facility]  Medicine

## 2024-10-03 ENCOUNTER — Ambulatory Visit: Payer: Self-pay | Admitting: Internal Medicine

## 2024-10-03 LAB — VITAMIN D 25 HYDROXY (VIT D DEFICIENCY, FRACTURES): VITD: 29.19 ng/mL — ABNORMAL LOW (ref 30.00–100.00)

## 2024-10-03 LAB — FERRITIN: Ferritin: 53.9 ng/mL (ref 10.0–291.0)

## 2024-10-03 LAB — VITAMIN B12: Vitamin B-12: 451 pg/mL (ref 211–911)

## 2024-10-03 NOTE — Progress Notes (Signed)
 The test results show that your current treatment is OK, as the tests are stable.  Please continue the same plan.  There is no other need for change of treatment or further evaluation based on these results, at this time.  thanks

## 2024-10-05 ENCOUNTER — Encounter: Payer: Self-pay | Admitting: Internal Medicine

## 2024-10-05 DIAGNOSIS — K146 Glossodynia: Secondary | ICD-10-CM | POA: Insufficient documentation

## 2024-10-05 NOTE — Assessment & Plan Note (Signed)
 Pt tolerating prolia  well, to continue this and add oscal + D , wt bearing excercise

## 2024-10-05 NOTE — Assessment & Plan Note (Signed)
 Last vitamin D  Lab Results  Component Value Date   VD25OH 29.19 (L) 10/02/2024   Low, to start oral replacement

## 2024-10-05 NOTE — Assessment & Plan Note (Addendum)
 Lab Results  Component Value Date   LDLCALC 77 10/02/2024   Stable, pt to continue low chol diet, wt control

## 2024-10-05 NOTE — Assessment & Plan Note (Signed)
 Lab Results  Component Value Date   VITAMINB12 451 10/02/2024   Stable, cont oral replacement - b12 1000 mcg qd

## 2024-10-05 NOTE — Assessment & Plan Note (Signed)
 Pt declines gabapentin  trial for now, ok for viscous lidocaine  prn limited rx

## 2024-10-05 NOTE — Assessment & Plan Note (Signed)
 Lab Results  Component Value Date   HGBA1C 5.7 10/02/2024   Stable, pt to continue current medical treatment  - diet, wt control

## 2024-10-05 NOTE — Assessment & Plan Note (Signed)
 Age and sex appropriate education and counseling updated with regular exercise and diet Referrals for preventative services - has mammogram for jan 2026 Immunizations addressed - for prevnar 20 today Smoking counseling  - none needed Evidence for depression or other mood disorder - stable anxiety, declines need for change in tx Most recent labs reviewed. I have personally reviewed and have noted: 1) the patient's medical and social history 2) The patient's current medications and supplements 3) The patient's height, weight, and BMI have been recorded in the chart

## 2024-10-20 ENCOUNTER — Ambulatory Visit: Admitting: Internal Medicine

## 2024-10-31 ENCOUNTER — Other Ambulatory Visit (HOSPITAL_COMMUNITY)
Admission: RE | Admit: 2024-10-31 | Discharge: 2024-10-31 | Disposition: A | Discharge status: Home / Self Care | Payer: Self-pay | Source: Ambulatory Visit | Attending: Oncology | Admitting: Oncology

## 2024-11-13 LAB — GENECONNECT MOLECULAR SCREEN: Genetic Analysis Overall Interpretation: NEGATIVE

## 2024-12-25 ENCOUNTER — Telehealth: Payer: Self-pay

## 2024-12-25 ENCOUNTER — Other Ambulatory Visit (HOSPITAL_COMMUNITY): Payer: Self-pay

## 2024-12-25 NOTE — Telephone Encounter (Signed)
 Prolia  VOB initiated via [email or web address]  Next Prolia  inj DUE: 01/14/25

## 2024-12-27 ENCOUNTER — Encounter: Payer: Self-pay | Admitting: Internal Medicine

## 2024-12-27 ENCOUNTER — Ambulatory Visit: Admitting: Internal Medicine

## 2024-12-27 VITALS — BP 126/74 | HR 79 | Wt 153.0 lb

## 2024-12-27 DIAGNOSIS — F1090 Alcohol use, unspecified, uncomplicated: Secondary | ICD-10-CM

## 2024-12-27 DIAGNOSIS — R12 Heartburn: Secondary | ICD-10-CM | POA: Diagnosis not present

## 2024-12-27 DIAGNOSIS — K148 Other diseases of tongue: Secondary | ICD-10-CM

## 2024-12-27 DIAGNOSIS — R1319 Other dysphagia: Secondary | ICD-10-CM

## 2024-12-27 DIAGNOSIS — R1310 Dysphagia, unspecified: Secondary | ICD-10-CM | POA: Diagnosis not present

## 2024-12-27 DIAGNOSIS — Z87891 Personal history of nicotine dependence: Secondary | ICD-10-CM

## 2024-12-27 DIAGNOSIS — K121 Other forms of stomatitis: Secondary | ICD-10-CM | POA: Diagnosis not present

## 2024-12-27 NOTE — Progress Notes (Signed)
 "       Denise Park 69 y.o. 06-Dec-1956 [id number]  Assessment & Plan:   Encounter Diagnoses  Name Primary?   Heartburn Yes   Esophageal dysphagia    Oral ulcer    Tongue lesion         Chronic symptoms persist despite acid suppression, indicating ongoing esophageal inflammation not clear that this is just GERD - ? Lichen planus also - Reviewed risk sheet for endoscopy and obtained consent. - Ordered repeat upper endoscopy (EGD) to evaluate esophagus and obtain biopsies. - Instructed to continue esomeprazole  daily.  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 - Reviewed prior ENT and pathology findings. - Discussed HSV-1 unlikely as etiology.  Intermittent mild dysphagia persists; prior improvement with dilation, no malignancy or obstructive lesions found previously. - Ordered repeat upper endoscopy (EGD) to assess for esophageal inflammation, stricture, or other pathology. - Discussed possible need for repeat esophageal dilation based on endoscopic findings.    Subjective:   Chief Complaint: heartburn  HPI Discussed the use of AI scribe software for clinical note transcription with the patient, who gave verbal consent to proceed.  Denise Park is a 69 year old female with esophagitis who presents for evaluation of persistent burning chest pain and reflux symptoms.  Burning chest pain and gastroesophageal reflux symptoms - Persistent burning pain in the lower chest, described as feeling raw - Pain is intermittent but can be severe, worsened by certain foods and beverages, especially alcohol such as beer - Heartburn and sore throat occur during episodes of increased reflux - Symptoms have persisted despite multiple acid suppression therapies, including esomeprazole  and omeprazole , with variable dosing between once and twice daily - Uncertain if twice daily dosing improved symptoms and  unsure about correct medication administration - Symptoms continued despite esophageal dilation performed during upper endoscopy in December 2024 - In August 2025, Nexium  was increased to twice daily but she went back to daily and cannot remember if it made a difference., Recurrent symptoms have persisted since then  Dysphagia - Intermittent dysphagia present - Recent episode of a pill feeling stuck in the throat - Swallowing previously improved after esophageal dilation, but occasional symptoms continue  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 Tobie of ENT did not show HSV-1. Has been treated with topical steroids and valcyclovir - not clear which if either helped. No oral ulcers now.  Peripheral neuropathy symptoms - New onset burning sensations in the bottoms of the feet - Burning is worsened by certain shoes and prolonged standing or driving - No burning elsewhere - No skin lesions or ulcers   - Alcohol intake is approximately six beers per week - Occasional consumption of Dr. Nunzio - Quit smoking over twenty years ago     12/28/23 tongue ulcer biopsy  A. TONGUE, LEFT, EXCISION:       Benign squamous mucosa with hyperparakeratosis and submucosal  chronic inflammation.       Negative for dysplasia or malignancy.       No evidence of viral inclusions.   B. TONGUE, RIGHT, EXCISION:       Benign squamous mucosa with hyperparakeratosis and submucosal  chronic inflammation.  Negative for dysplasia or malignancy.       No evidence of viral inclusions   EGD 11/2023:   - LA Grade A reflux esophagitis with no bleeding. Biopsied. - Esophageal stenosis. Dilated. 20 mm - no effect - Benign- appearing esophageal  stenosis - proximal esophagus. Dilated. 18 mm w/ effect - Small sliding hiatal hernia. - The examination was otherwise normal.   FINAL DIAGNOSIS       1. Surgical [P], GE junction :      -  SQUAMOCOLUMNAR JUNCTIONAL MUCOSA WITH SIGNIFICANTLY INCREASED INTRAEPITHELIAL      LYMPHOCYTES AND FOCAL EROSION.      NOTE:  THE BIOPSY CONSISTS OF SQUAMOCOLUMNAR JUNCTIONAL MUCOSA WITH PROMINENT      BASAL CELL HYPERPLASIA, PAPILLARY ELONGATION, REACTIVE/REPARATIVE CHANGE AND THE      PRESENCE OF INCREASED INTRAEPITHELIAL LYMPHOCYTES, WALL THE OVERALL APPEARANCE      MOST LIKELY REPRESENTS REFLUX ESOPHAGITIS WITH EVIDENCE OF FOCAL EROSION, GIVEN      THE LEVEL OF INTRAEPITHELIAL LYMPHOCYTES OTHER ETIOLOGIES INCLUDING DYSMOTILITY      DISORDERS AS WELL AS LICHEN PLANUS ARE ALSO CONSIDERED.  IMMUNOHISTOCHEMICAL      STAINS FOR HSV I/II AND CMV ARE NEGATIVE.  A PAS STAIN IS NEGATIVE FOR FUNGAL      FORMS.     Lab Results  Component Value Date   VITAMINB12 451 10/02/2024   Lab Results  Component Value Date   WBC 7.7 10/02/2024   HGB 14.2 10/02/2024   HCT 43.2 10/02/2024   MCV 89.2 10/02/2024   PLT 272.0 10/02/2024   Lab Results  Component Value Date   FERRITIN 53.9 10/02/2024     Allergies[1] Active Medications[2] Past Medical History:  Diagnosis Date   ALLERGIC RHINITIS 10/30/2007   ANEMIA-IRON DEFICIENCY 10/30/2007   ANXIETY 10/30/2007   Arthritis    BACK PAIN 02/13/2008   Cataract    COMMON MIGRAINE 10/30/2007   DEPRESSION 10/30/2007   GERD 10/30/2007   HYPERSOMNIA 09/11/2008   INSOMNIA-SLEEP DISORDER-UNSPEC 09/11/2008   IRRITABLE BOWEL SYNDROME, HX OF 10/30/2007   OSTEOPENIA 02/13/2008   Osteopenia 05/22/2011   Osteoporosis    PELVIC PAIN, CHRONIC 05/14/2009   SINUSITIS- ACUTE-NOS 02/13/2008   Past Surgical History:  Procedure Laterality Date   APPENDECTOMY     age 38   CESAREAN SECTION     COLONOSCOPY     ESOPHAGOGASTRODUODENOSCOPY     TUBAL LIGATION     Social  History   Social History Narrative   Mail carrier, separated   Former smoker   6 beers a week   No drugs   family history includes Coronary artery disease in an other family member; Depression in an other family member; Heart disease in her father; Suicidality in her mother.   Review of Systems  As per HPI Objective:   Physical Exam @BP  126/74   Pulse 79   Wt 153 lb (69.4 kg)   LMP 03/31/2011   BMI 23.96 kg/m @  General:  NAD Eyes:   Anicteric Mouth/Pharynx: No lesions at all Lungs:  clear Heart::  S1S2 no rubs, murmurs or gallops     Data Reviewed:  ENT notes and biopsy pathology As per HPI PCP notes    [1] No Known Allergies [2]  Current Meds  Medication Sig   buPROPion  ER (WELLBUTRIN  SR) 100 MG 12 hr tablet TAKE 1 TABLET BY MOUTH TWICE A DAY   busPIRone  (BUSPAR ) 10 MG tablet Take 1 tablet (10 mg total) by mouth 3 (  three) times daily as needed.   CALCIUM-VITAMIN D  PO Take by mouth daily.   cetirizine  (ZYRTEC ) 10 MG tablet TAKE 1 TABLET(10 MG) BY MOUTH DAILY (Patient taking differently: Take 10 mg by mouth daily as needed.)   clobetasol (TEMOVATE) 0.05 % external solution Apply 1 Application topically daily as needed.   denosumab  (PROLIA ) 60 MG/ML SOSY injection Inject 60 mg into the skin every 6 (six) months.   diclofenac  Sodium (VOLTAREN ) 1 % GEL Apply 2 g topically 4 (four) times daily.   esomeprazole  (NEXIUM ) 40 MG capsule Take 1 capsule (40 mg total) by mouth 2 (two) times daily before a meal. (Patient taking differently: Take 40 mg by mouth daily.)   IMVEXXY MAINTENANCE PACK 4 MCG INST Place 1 capsule vaginally 2 (two) times a week.   lidocaine  (XYLOCAINE ) 2 % solution Use as directed 15 mLs in the mouth or throat as needed for mouth pain.   meloxicam  (MOBIC ) 15 MG tablet TAKE 1 TABLET BY MOUTH EVERY DAY AS NEEDED FOR PAIN   MULTIPLE VITAMINS-MINERALS    tretinoin (RETIN-A) 0.05 % cream APPLY TO AFFECTED AREA EVERY DAY AT BEDTIME   triamcinolone   (NASACORT ) 55 MCG/ACT AERO nasal inhaler Place 2 sprays into the nose daily. (Patient taking differently: Place 2 sprays into the nose daily as needed.)   zolpidem  (AMBIEN ) 10 MG tablet TAKE 1 TABLET BY MOUTH EVERY DAY AT BEDTIME AS NEEDED   Current Facility-Administered Medications for the 12/27/24 encounter (Office Visit) with Avram Lupita BRAVO, MD  Medication   [START ON 01/14/2025] denosumab  (PROLIA ) injection 60 mg   "

## 2024-12-27 NOTE — Patient Instructions (Signed)
 You have been scheduled for an endoscopy. Please follow written instructions given to you at your visit today.  If you use inhalers (even only as needed), please bring them with you on the day of your procedure.  If you take any of the following medications, they will need to be adjusted prior to your procedure:   DO NOT TAKE 7 DAYS PRIOR TO TEST- Trulicity (dulaglutide) Ozempic, Wegovy (semaglutide) Mounjaro, Zepbound (tirzepatide) Bydureon Bcise (exanatide extended release)  DO NOT TAKE 1 DAY PRIOR TO YOUR TEST Rybelsus (semaglutide) Adlyxin (lixisenatide) Victoza (liraglutide) Byetta (exanatide) ___________________________________________________________________________  _______________________________________________________  If your blood pressure at your visit was 140/90 or greater, please contact your primary care physician to follow up on this.  _______________________________________________________  If you are age 67 or older, your body mass index should be between 23-30. Your Body mass index is 23.96 kg/m. If this is out of the aforementioned range listed, please consider follow up with your Primary Care Provider.  If you are age 75 or younger, your body mass index should be between 19-25. Your Body mass index is 23.96 kg/m. If this is out of the aformentioned range listed, please consider follow up with your Primary Care Provider.   ________________________________________________________  The [facility] providers would like to encourage you to use MYCHART to communicate with providers for non-urgent requests or questions.  Due to long hold times on the telephone, sending your provider a message by [facility] [address] may be a faster and more efficient way to get a response.  Please allow 48 business hours for a response.  Please remember that this is for non-urgent requests.  _______________________________________________________  [facility] is using a team-based  approach to care.  Your team is made up of your doctor and two to three APPS. Our APPS (Nurse Practitioners and Physician Assistants) work with your physician to ensure care continuity for you. They are fully qualified to address your health concerns and develop a treatment plan. They communicate directly with your gastroenterologist to care for you. Seeing the Advanced Practice Practitioners on your physician's team can help you by facilitating care more promptly, often allowing for earlier appointments, access to diagnostic testing, procedures, and other specialty referrals.   I appreciate the opportunity to care for you. Lupita Commander, MD, [facility]

## 2024-12-29 ENCOUNTER — Other Ambulatory Visit (HOSPITAL_COMMUNITY): Payer: Self-pay

## 2024-12-29 NOTE — Telephone Encounter (Signed)
 PHARMACY APPROVAL

## 2024-12-29 NOTE — Telephone Encounter (Signed)
 SABRA

## 2024-12-29 NOTE — Telephone Encounter (Signed)
 MEDICAL APPROVAL

## 2024-12-29 NOTE — Telephone Encounter (Signed)
 Pt ready for scheduling for PROLIA  on or after : 01/14/25  Option# 1: Buy/Bill (Office supplied medication)  Out-of-pocket cost due at time of clinic visit: $277.50 + DEDUCTIBLE  Number of injection/visits approved: 2  Primary: [employer] Prolia  co-insurance: 15% Admin fee co-insurance: 15%  Secondary: --- Prolia  co-insurance:  Admin fee co-insurance:   Medical Benefit Details: Date Benefits were checked: 12/29/24 Deductible: $0 Met of $350 Required/ Coinsurance: 15%/ Admin Fee: 15%  Prior Auth: APPROVED PA# [id number] Expiration Date: 02/29/24-02/27/25  # of doses approved: 2 ----------------------------------------------------------------------- Option# 2- Med Obtained from pharmacy:  Pharmacy benefit: Copay $MUST FILL AT SPECIALTY PHARMACY (Paid to pharmacy) Admin Fee: 15% (Pay at clinic)  Prior Auth: APPROVED PA# [id number] Expiration Date: 05/10/24-05/10/25  # of doses approved: 2   If patient wants fill through the pharmacy benefit please send prescription to: [facility], and include estimated need by date in rx notes. Pharmacy will ship medication directly to the office.  Patient IS eligible for Prolia  Copay Card. Copay Card can make patient's cost as little as $25. Link to apply: [email or web address]://www.amgensupportplus.[email or web address]/copay  ** This summary of benefits is an estimation of the patient's out-of-pocket cost. Exact cost may very based on individual plan coverage.

## 2025-01-01 ENCOUNTER — Encounter: Payer: Self-pay | Admitting: Internal Medicine

## 2025-01-01 ENCOUNTER — Ambulatory Visit: Admitting: Internal Medicine

## 2025-01-01 VITALS — BP 114/79 | HR 70 | Temp 98.1°F | Resp 13 | Ht 67.0 in | Wt 153.0 lb

## 2025-01-01 DIAGNOSIS — K449 Diaphragmatic hernia without obstruction or gangrene: Secondary | ICD-10-CM

## 2025-01-01 DIAGNOSIS — K208 Other esophagitis without bleeding: Secondary | ICD-10-CM

## 2025-01-01 DIAGNOSIS — K222 Esophageal obstruction: Secondary | ICD-10-CM | POA: Diagnosis present

## 2025-01-01 DIAGNOSIS — K209 Esophagitis, unspecified without bleeding: Secondary | ICD-10-CM | POA: Diagnosis not present

## 2025-01-01 DIAGNOSIS — R1319 Other dysphagia: Secondary | ICD-10-CM

## 2025-01-01 MED ORDER — SODIUM CHLORIDE 0.9 % IV SOLN: 500.0000 mL | INTRAVENOUS | Status: DC

## 2025-01-01 NOTE — Progress Notes (Signed)
 History and Physical Interval Note:  01/01/2025 9:14 AM  Denise Park  has presented today for endoscopic procedure(s), with the diagnosis of  Encounter Diagnosis  Name Primary?   Esophageal dysphagia Yes  .  The various methods of evaluation and treatment have been discussed with the patient and/or family. After consideration of risks, benefits and other options for treatment, the patient has consented to  the endoscopic procedure(s).   The patient's history has been reviewed, patient examined, no change in status, stable for endoscopic procedure(s).  I have reviewed the patient's chart and labs.  Questions were answered to the patient's satisfaction.     Lupita Denise Commander, MD, NOLIA

## 2025-01-01 NOTE — Patient Instructions (Addendum)
 I did not see inflammation but I found narrow areas in the esophagus that I dilated and I took biopsies again to understand this.  Please follow the special diet instructions for today and I will follow-up with you about the results and recommendations.  I think you will be able to swallow better.  I cannot comment on the heartburn yet until I understand the biopsies and then I can advise about potential change in medication.  I appreciate the opportunity to care for you. Denise CHARLENA Commander, MD, [facility]  Follow dilation diet for today.  See handout.   YOU HAD AN ENDOSCOPIC PROCEDURE TODAY AT THE [facility]:   Refer to the procedure report that was given to you for any specific questions about what was found during the examination.  If the procedure report does not answer your questions, please call your gastroenterologist to clarify.  If you requested that your care partner not be given the details of your procedure findings, then the procedure report has been included in a sealed envelope for you to review at your convenience later.  YOU SHOULD EXPECT: Some feelings of bloating in the abdomen. Passage of more gas than usual.  Walking can help get rid of the air that was put into your GI tract during the procedure and reduce the bloating. If you had a lower endoscopy (such as a colonoscopy or flexible sigmoidoscopy) you may notice spotting of blood in your stool or on the toilet paper. If you underwent a bowel prep for your procedure, you may not have a normal bowel movement for a few days.  Please Note:  You might notice some irritation and congestion in your nose or some drainage.  This is from the oxygen used during your procedure.  There is no need for concern and it should clear up in a day or so.  SYMPTOMS TO REPORT IMMEDIATELY:   Following upper endoscopy (EGD)  Vomiting of blood or coffee ground material  New chest pain or pain under the shoulder blades  Painful or persistently  difficult swallowing  New shortness of breath  Fever of 100F or higher  Black, tarry-looking stools  For urgent or emergent issues, a gastroenterologist can be reached at any hour by calling [phone]. Do not use MyChart messaging for urgent concerns.    DIET:  We do recommend a small meal at first, but then you may proceed to your regular diet.  Drink plenty of fluids but you should avoid alcoholic beverages for 24 hours.  ACTIVITY:  You should plan to take it easy for the rest of today and you should NOT DRIVE or use heavy machinery until tomorrow (because of the sedation medicines used during the test).    FOLLOW UP: Our staff will call the number listed on your records the next business day following your procedure.  We will call around 7:15- 8:00 am to check on you and address any questions or concerns that you may have regarding the information given to you following your procedure. If we do not reach you, we will leave a message.     If any biopsies were taken you will be contacted by phone or by letter within the next 1-3 weeks.  Please call us  at [phone] if you have not heard about the biopsies in 3 weeks.    SIGNATURES/CONFIDENTIALITY: You and/or your care partner have signed paperwork which will be entered into your electronic medical record.  These signatures attest to the  fact that that the information above on your After Visit Summary has been reviewed and is understood.  Full responsibility of the confidentiality of this discharge information lies with you and/or your care-partner.

## 2025-01-01 NOTE — Progress Notes (Signed)
 Report to PACU, RN, vss, BBS= Clear.

## 2025-01-01 NOTE — Progress Notes (Signed)
 Called to room to assist during endoscopic procedure.  Patient ID and intended procedure confirmed with present staff. Received instructions for my participation in the procedure from the performing physician.

## 2025-01-01 NOTE — Op Note (Signed)
 [facility] Patient Name: Denise Park Procedure Date: 01/01/2025 9:07 AM MRN: [id number] Endoscopist: Lupita FORBES Commander , MD, [id number] Age: 69 Referring MD:  Date of Birth: [birth year] Gender: Female Account #: [id number] Procedure:                Upper GI endoscopy Indications:              Dysphagia, Heartburn - despite PPI Medicines:                Monitored Anesthesia Care Procedure:                Pre-Anesthesia Assessment:                           - Prior to the procedure, a History and Physical                            was performed, and patient medications and                            allergies were reviewed. The patient's tolerance of                            previous anesthesia was also reviewed. The risks                            and benefits of the procedure and the sedation                            options and risks were discussed with the patient.                            All questions were answered, and informed consent                            was obtained. Prior Anticoagulants: The patient has                            taken no anticoagulant or antiplatelet agents. ASA                            Grade Assessment: II - A patient with mild systemic                            disease. After reviewing the risks and benefits,                            the patient was deemed in satisfactory condition to                            undergo the procedure.                           After obtaining informed consent, the endoscope was  passed under direct vision. Throughout the                            procedure, the patient's blood pressure, pulse, and                            oxygen saturations were monitored continuously. The                            GIF [id number] #[id number] was introduced through the                            mouth, and advanced to the second part of duodenum.                            The upper GI  endoscopy was accomplished without                            difficulty. The patient tolerated the procedure                            well. Scope In: Scope Out: Findings:                 Multiple benign-appearing, intrinsic moderate                            (circumferential scarring or stenosis; an endoscope                            may pass) stenoses were found in the proximal and                            distal esophagus. The stenoses were traversed. A                            TTS dilator was passed through the scope. Dilation                            with a 15-16.5-18 mm balloon dilator was performed                            to 18 mm. The dilation site was examined and showed                            moderate mucosal disruption. Estimated blood loss                            was minimal. Biopsies were taken with a cold                            forceps for histology. Verification of patient  identification for the specimen was done. Estimated                            blood loss was minimal.                           A 3 cm sliding hiatal hernia was found.                           The exam was otherwise without abnormality.                           The gastroesophageal flap valve was visualized                            endoscopically and classified as Hill Grade III                            (minimal fold, loose to endoscope, hiatal hernia                            likely). Complications:            No immediate complications. Estimated Blood Loss:     Estimated blood loss was minimal. Impression:               - Benign-appearing esophageal stenoses. Dilated.                            Distal to 18 mm at GE junction and proximal                            esophagus (multiple rings) to 15 mm (after initial                            dilation effect from scope passage) Biopsied the                            esophagus also - distal,  mid and proximal. She is                            on PPI. Hx of increased lymphocytes in prior                            biopsies last EGD 2024.                           - 3 cm sliding hiatal hernia.                           - The examination was otherwise normal.                           - Gastroesophageal flap valve classified as Hill  Grade III (minimal fold, loose to endoscope, hiatal                            hernia likely). Recommendation:           - Patient has a contact number available for                            emergencies. The signs and symptoms of potential                            delayed complications were discussed with the                            patient. Return to normal activities tomorrow.                            Written discharge instructions were provided to the                            patient.                           - Clear liquids x 1 hour then soft foods rest of                            day. Start prior diet tomorrow.                           - Continue present medications.                           - Await pathology results. Lupita FORBES Commander, MD 01/01/2025 9:51:18 AM This report has been signed electronically.

## 2025-01-02 ENCOUNTER — Telehealth: Payer: Self-pay

## 2025-01-02 NOTE — Telephone Encounter (Signed)
" °  Follow up Call-     01/01/2025    8:23 AM 11/24/2023    2:54 PM  Call back number  Post procedure Call Back phone  # [phone]  Permission to leave phone message Yes Yes     Patient questions:  Do you have a fever, pain , or abdominal swelling? No. Pain Score  0 *  Have you tolerated food without any problems? Yes.    Have you been able to return to your normal activities? Yes.    Do you have any questions about your discharge instructions: Diet   No. Medications  No. Follow up visit  No.  Do you have questions or concerns about your Care? Yes.    Actions: * If pain score is 4 or above: No action needed, pain <4.   "

## 2025-01-03 LAB — SURGICAL PATHOLOGY

## 2025-01-03 NOTE — Telephone Encounter (Signed)
 Patient is cleared for PROLIA  injection AFTER 01/14/2025 per PA information on 12/29/2024.  Mentioned in provider note last on 10/02/2024. Medication is TBD supplied. CoPay: $627.50-[facility] supplied;  Pharm Supplied - need to send order to pharmacy they will tell cost she pay them and pay $22.35 tus  day of appt.

## 2025-01-05 ENCOUNTER — Other Ambulatory Visit: Payer: Self-pay

## 2025-01-05 MED ORDER — DENOSUMAB 60 MG/ML ~~LOC~~ SOSY: 60.0000 mg | PREFILLED_SYRINGE | Freq: Once | SUBCUTANEOUS | 0 refills | Status: AC

## 2025-01-05 NOTE — Telephone Encounter (Signed)
 Called and spoke with Pt , Prolia  has been ordered thru the pharmacy to be sent to the office. Will call Pt to schedule once the Prolia  arrives.

## 2025-01-08 ENCOUNTER — Telehealth: Payer: Self-pay | Admitting: Internal Medicine

## 2025-01-08 DIAGNOSIS — K208 Other esophagitis without bleeding: Secondary | ICD-10-CM

## 2025-01-08 NOTE — Telephone Encounter (Signed)
 Inbound call from patient requesting to speak to the nurse in regards to her EGD results. Please advise.

## 2025-01-09 ENCOUNTER — Ambulatory Visit: Payer: Self-pay | Admitting: Internal Medicine

## 2025-01-09 DIAGNOSIS — K208 Other esophagitis without bleeding: Secondary | ICD-10-CM | POA: Insufficient documentation

## 2025-01-09 NOTE — Telephone Encounter (Signed)
 Patient advised that her biopsy results have not been reviewed by her provider. She will be contacted once they are reviewed.

## 2025-01-09 NOTE — Telephone Encounter (Signed)
 Called patient and explained that all biopsies confirm lymphocytic esophagitis.  She did not have bad dysphagia prior she is a little better with that.  Her esophageal symptoms are somewhat better.   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  She has had some mouth ulcers.  I do not think she has herpes simplex disorder.  I am going to do some more research but I do not think there is any unifying diagnosis or clear-cut etiology.  I will get back to her.

## 2025-01-17 ENCOUNTER — Telehealth: Payer: Self-pay

## 2025-01-17 NOTE — Telephone Encounter (Signed)
 This has been noted

## 2025-01-17 NOTE — Telephone Encounter (Signed)
 Copied from CRM #[id number]. Topic: Clinical - Medication Question >> Jan 17, 2025  8:47 AM Antwanette L wrote: Reason for CRM: Monique from [facility] called to verify the office address and confirm that the patient's Prolia  60 mg shipment will arrive on Friday. No signature is required. CAL has been notified.

## 2025-01-19 ENCOUNTER — Telehealth: Payer: Self-pay

## 2025-01-19 NOTE — Telephone Encounter (Signed)
 Called and left voicemail letting Pt know her Prolia  Injection has arrived in the office, Pt is to call back and schedule her nurse visit to receive her Prolia  Injection.

## 2025-01-23 ENCOUNTER — Telehealth: Payer: Self-pay

## 2025-01-23 NOTE — Telephone Encounter (Signed)
 Copied from CRM [id number]. Topic: Appointments - Scheduling Inquiry for Clinic >> Jan 23, 2025 12:26 PM Sophia H wrote: Reason for CRM:    Note in chart Called and left voicemail letting Pt know her Prolia  Injection has arrived in the office, Pt is to call back and schedule her nurse visit to receive her Prolia  Injection.  Patient is wanting to know if she can come in today, lives an hour away and will be in town around 3/4pm. Advised nothing on schedule I can put her down for today, please reach out. TY # [phone]

## 2025-01-23 NOTE — Telephone Encounter (Signed)
 Called and got Pt scheduled for Nurse Visit.

## 2025-02-01 ENCOUNTER — Ambulatory Visit
